# Patient Record
Sex: Female | Born: 1965 | Race: White | Hispanic: No | Marital: Single | Smoking: Former smoker
Health system: Southern US, Community
[De-identification: ages and names within clinical notes are randomized; demographics above are authoritative.]

## PROBLEM LIST (undated history)

## (undated) DIAGNOSIS — C801 Malignant (primary) neoplasm, unspecified: Secondary | ICD-10-CM

## (undated) DIAGNOSIS — M1990 Unspecified osteoarthritis, unspecified site: Secondary | ICD-10-CM

## (undated) DIAGNOSIS — I447 Left bundle-branch block, unspecified: Secondary | ICD-10-CM

## (undated) DIAGNOSIS — F419 Anxiety disorder, unspecified: Secondary | ICD-10-CM

## (undated) DIAGNOSIS — Z923 Personal history of irradiation: Secondary | ICD-10-CM

## (undated) DIAGNOSIS — K219 Gastro-esophageal reflux disease without esophagitis: Secondary | ICD-10-CM

## (undated) DIAGNOSIS — E559 Vitamin D deficiency, unspecified: Secondary | ICD-10-CM

## (undated) DIAGNOSIS — G4730 Sleep apnea, unspecified: Secondary | ICD-10-CM

## (undated) DIAGNOSIS — Z1379 Encounter for other screening for genetic and chromosomal anomalies: Secondary | ICD-10-CM

## (undated) DIAGNOSIS — E785 Hyperlipidemia, unspecified: Secondary | ICD-10-CM

## (undated) DIAGNOSIS — R519 Headache, unspecified: Secondary | ICD-10-CM

## (undated) DIAGNOSIS — R51 Headache: Secondary | ICD-10-CM

## (undated) DIAGNOSIS — F32A Depression, unspecified: Secondary | ICD-10-CM

## (undated) DIAGNOSIS — F329 Major depressive disorder, single episode, unspecified: Secondary | ICD-10-CM

## (undated) DIAGNOSIS — M1611 Unilateral primary osteoarthritis, right hip: Secondary | ICD-10-CM

## (undated) DIAGNOSIS — I10 Essential (primary) hypertension: Secondary | ICD-10-CM

## (undated) DIAGNOSIS — Z803 Family history of malignant neoplasm of breast: Secondary | ICD-10-CM

## (undated) HISTORY — DX: Sleep apnea, unspecified: G47.30

## (undated) HISTORY — DX: Essential (primary) hypertension: I10

## (undated) HISTORY — DX: Encounter for other screening for genetic and chromosomal anomalies: Z13.79

## (undated) HISTORY — PX: DENTAL SURGERY: SHX609

## (undated) HISTORY — DX: Anxiety disorder, unspecified: F41.9

## (undated) HISTORY — DX: Hyperlipidemia, unspecified: E78.5

## (undated) HISTORY — DX: Vitamin D deficiency, unspecified: E55.9

## (undated) HISTORY — DX: Family history of malignant neoplasm of breast: Z80.3

---

## 2000-02-11 ENCOUNTER — Other Ambulatory Visit: Admission: RE | Admit: 2000-02-11 | Discharge: 2000-02-11 | Payer: Self-pay | Admitting: Obstetrics and Gynecology

## 2000-02-26 ENCOUNTER — Other Ambulatory Visit: Admission: RE | Admit: 2000-02-26 | Discharge: 2000-02-26 | Payer: Self-pay | Admitting: Obstetrics and Gynecology

## 2000-02-26 ENCOUNTER — Encounter (INDEPENDENT_AMBULATORY_CARE_PROVIDER_SITE_OTHER): Payer: Self-pay | Admitting: Specialist

## 2000-10-07 ENCOUNTER — Ambulatory Visit (HOSPITAL_BASED_OUTPATIENT_CLINIC_OR_DEPARTMENT_OTHER): Admission: RE | Admit: 2000-10-07 | Discharge: 2000-10-07 | Payer: Self-pay | Admitting: *Deleted

## 2000-12-05 ENCOUNTER — Ambulatory Visit (HOSPITAL_BASED_OUTPATIENT_CLINIC_OR_DEPARTMENT_OTHER): Admission: RE | Admit: 2000-12-05 | Discharge: 2000-12-05 | Payer: Self-pay | Admitting: *Deleted

## 2001-01-07 ENCOUNTER — Encounter: Payer: Self-pay | Admitting: Obstetrics and Gynecology

## 2001-01-07 ENCOUNTER — Ambulatory Visit (HOSPITAL_COMMUNITY): Admission: RE | Admit: 2001-01-07 | Discharge: 2001-01-07 | Payer: Self-pay | Admitting: Obstetrics and Gynecology

## 2003-05-16 ENCOUNTER — Other Ambulatory Visit: Admission: RE | Admit: 2003-05-16 | Discharge: 2003-05-16 | Payer: Self-pay | Admitting: Obstetrics and Gynecology

## 2004-07-04 ENCOUNTER — Other Ambulatory Visit: Admission: RE | Admit: 2004-07-04 | Discharge: 2004-07-04 | Payer: Self-pay | Admitting: Obstetrics and Gynecology

## 2005-01-19 ENCOUNTER — Ambulatory Visit (HOSPITAL_COMMUNITY): Admission: RE | Admit: 2005-01-19 | Discharge: 2005-01-19 | Payer: Self-pay | Admitting: Obstetrics and Gynecology

## 2007-02-25 ENCOUNTER — Ambulatory Visit (HOSPITAL_COMMUNITY): Admission: RE | Admit: 2007-02-25 | Discharge: 2007-02-25 | Payer: Self-pay | Admitting: Family Medicine

## 2007-05-07 ENCOUNTER — Other Ambulatory Visit: Admission: RE | Admit: 2007-05-07 | Discharge: 2007-05-07 | Payer: Self-pay | Admitting: Obstetrics and Gynecology

## 2009-11-20 ENCOUNTER — Other Ambulatory Visit: Admission: RE | Admit: 2009-11-20 | Discharge: 2009-11-20 | Payer: Self-pay | Admitting: Obstetrics and Gynecology

## 2010-04-01 ENCOUNTER — Encounter: Payer: Self-pay | Admitting: Family Medicine

## 2010-06-22 ENCOUNTER — Encounter (HOSPITAL_COMMUNITY): Payer: Self-pay

## 2011-01-07 ENCOUNTER — Other Ambulatory Visit: Payer: Self-pay | Admitting: Obstetrics and Gynecology

## 2011-01-07 ENCOUNTER — Other Ambulatory Visit (HOSPITAL_COMMUNITY)
Admission: RE | Admit: 2011-01-07 | Discharge: 2011-01-07 | Disposition: A | Discharge status: Home / Self Care | Payer: PRIVATE HEALTH INSURANCE | Source: Ambulatory Visit | Attending: Obstetrics and Gynecology | Admitting: Obstetrics and Gynecology

## 2011-01-07 DIAGNOSIS — Z01419 Encounter for gynecological examination (general) (routine) without abnormal findings: Secondary | ICD-10-CM | POA: Insufficient documentation

## 2012-01-08 ENCOUNTER — Other Ambulatory Visit (HOSPITAL_COMMUNITY)
Admission: RE | Admit: 2012-01-08 | Discharge: 2012-01-08 | Disposition: A | Discharge status: Home / Self Care | Payer: BC Managed Care – PPO | Source: Ambulatory Visit | Attending: Obstetrics and Gynecology | Admitting: Obstetrics and Gynecology

## 2012-01-08 ENCOUNTER — Other Ambulatory Visit: Payer: Self-pay | Admitting: Obstetrics and Gynecology

## 2012-01-08 DIAGNOSIS — Z01419 Encounter for gynecological examination (general) (routine) without abnormal findings: Secondary | ICD-10-CM | POA: Insufficient documentation

## 2012-01-08 DIAGNOSIS — Z1151 Encounter for screening for human papillomavirus (HPV): Secondary | ICD-10-CM | POA: Insufficient documentation

## 2012-03-27 ENCOUNTER — Encounter: Payer: Self-pay | Admitting: Internal Medicine

## 2012-08-06 ENCOUNTER — Telehealth (INDEPENDENT_AMBULATORY_CARE_PROVIDER_SITE_OTHER): Payer: Self-pay | Admitting: General Surgery

## 2012-08-06 NOTE — Telephone Encounter (Signed)
Tina Ponce with [facility] calling to move patient's appt sooner. 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 She has had issues with hemorrhoids since December. States they are getting worse. She has an appt for evaluation next Friday. She was calling for her to be seen in urgent office. She read her last office notes and these are internal hemorrhoids and not thrombosed. I advised we could not see her in urgent office for this problem. I advised to keep appt and she can call for cancellations to be seen sooner.

## 2012-08-11 ENCOUNTER — Encounter (INDEPENDENT_AMBULATORY_CARE_PROVIDER_SITE_OTHER): Payer: Self-pay | Admitting: General Surgery

## 2012-08-14 ENCOUNTER — Encounter (INDEPENDENT_AMBULATORY_CARE_PROVIDER_SITE_OTHER): Payer: Self-pay | Admitting: General Surgery

## 2012-08-14 ENCOUNTER — Ambulatory Visit (INDEPENDENT_AMBULATORY_CARE_PROVIDER_SITE_OTHER): Payer: BC Managed Care – PPO | Admitting: General Surgery

## 2012-08-14 VITALS — BP 140/90 | HR 77 | Temp 98.3°F | Resp 17 | Ht 69.0 in | Wt 229.4 lb

## 2012-08-14 DIAGNOSIS — K644 Residual hemorrhoidal skin tags: Secondary | ICD-10-CM

## 2012-08-14 NOTE — Progress Notes (Signed)
Patient ID: Tina Ponce, female   DOB: May 05, 1965, 47 y.o.   MRN: [id number]  Chief Complaint  Patient presents with  . New Evaluation    eval hems    HPI Tina Ponce is a 47 y.o. female.  The patient is a 47 year old female who is referred by Dr. Tiburcio Pea secondary to hemorrhoids. Patient states she's had hemorrhoids for multiple years and has been increasing in pain over the last 6 months. The patient states she has had some stomach viruses which cause diarrhea over the last 6 months and feels that this is potentially treated to issue. The patient has been on Anusol as well as nitroglycerin cream which does help relieve some of the pain.  The patient is not on any fiber supplementation at this time. The patient states she has not noticed any bleeding and only complains of pain after having bowel movements. HPI  Past Medical History  Diagnosis Date  . Hypertension   . Anxiety   . Sleep apnea   . Unspecified vitamin D deficiency   . Hyperlipidemia   . Asthma     Past Surgical History  Procedure Laterality Date  . Cesarean section  11/03/90    Family History  Problem Relation Age of Onset  . Hypertension Mother   . Diabetes Mother   . Cancer Father   . Cancer Sister     breast    Social History History  Substance Use Topics  . Smoking status: Former Smoker    Quit date: 03/20/2010  . Smokeless tobacco: Not on file  . Alcohol Use: No    Allergies  Allergen Reactions  . Avelox (Moxifloxacin Hcl In Nacl) Swelling    erythema  . Codeine Itching    Current Outpatient Prescriptions  Medication Sig Dispense Refill  . albuterol (PROVENTIL HFA;VENTOLIN HFA) 108 (90 BASE) MCG/ACT inhaler Inhale 2 puffs into the lungs every 6 (six) hours as needed for wheezing.      Marland Kitchen ALPRAZolam (XANAX) 0.5 MG tablet Take 0.5 mg by mouth 3 (three) times daily as needed for sleep or anxiety.      . calcium carbonate 200 MG capsule Take 250 mg by mouth daily.      . carisoprodol  (SOMA) 350 MG tablet Take 350 mg by mouth 4 (four) times daily as needed for muscle spasms.      . hydrocortisone (ANUSOL-HC) 2.5 % rectal cream Place rectally 2 (two) times daily.      . hydrocortisone (ANUSOL-HC) 25 MG suppository Place 25 mg rectally 2 (two) times daily.      Marland Kitchen lidocaine (LMX) 4 % cream Apply 1 application topically as needed.      . meloxicam (MOBIC) 15 MG tablet Take 15 mg by mouth daily.      . sertraline (ZOLOFT) 50 MG tablet Take 50 mg by mouth once a week.       No current facility-administered medications for this visit.    Review of Systems Review of Systems  Constitutional: Negative.   HENT: Negative.   Eyes: Negative.   Respiratory: Negative.   Cardiovascular: Negative.   Gastrointestinal: Negative.   Neurological: Negative.   All other systems reviewed and are negative.    Blood pressure 140/90, pulse 77, temperature 98.3 F (36.8 C), temperature source Temporal, resp. rate 17, height 5\' 9"  (1.753 m), weight 229 lb 6.4 oz (104.055 kg), SpO2 98.00%.  Physical Exam Physical Exam  Vitals reviewed. Constitutional: She is oriented to person, place, and  time. She appears well-developed and well-nourished.  HENT:  Head: Normocephalic and atraumatic.  Eyes: Conjunctivae and EOM are normal. Pupils are equal, round, and reactive to light.  Neck: Normal range of motion. Neck supple.  Cardiovascular: Normal rate, regular rhythm and normal heart sounds.   Pulmonary/Chest: Effort normal and breath sounds normal.  Abdominal: Soft. Bowel sounds are normal. There is no tenderness. There is no rebound and no guarding.  Genitourinary:     Musculoskeletal: Normal range of motion.  Neurological: She is alert and oriented to person, place, and time.  Skin: Skin is warm and dry.    Data Reviewed none  Assessment    47 year old female with external hemorrhoids at the 3:00, 7:00, and 10:00 position. Patient also has internal hemorrhoids which noninflamed.       Plan    1. We discussed medical treatment as well as pathophysiology of hemorrhoids the patient would like to continue with medical treatment and begin fiber supplementation at this time. We also discussed sitz baths tub with pain after bowel movements. The patient states she does get some relief with nitroglycerin she can continue with this at this time. Would also recommend continue Anusol treatment at this time.  2. We discussed surgical treatment for her external hemorrhoids, at this time the patient like to avoid any surgery for hemorrhoids. 3. We have patient followup as needed.       Marigene Ehlers., Celeste Candelas 08/14/2012, 2:30 PM

## 2013-01-29 ENCOUNTER — Other Ambulatory Visit: Payer: Self-pay | Admitting: Obstetrics & Gynecology

## 2013-01-29 ENCOUNTER — Other Ambulatory Visit (HOSPITAL_COMMUNITY)
Admission: RE | Admit: 2013-01-29 | Discharge: 2013-01-29 | Disposition: A | Discharge status: Home / Self Care | Payer: BC Managed Care – PPO | Source: Ambulatory Visit | Attending: Obstetrics & Gynecology | Admitting: Obstetrics & Gynecology

## 2013-01-29 DIAGNOSIS — N760 Acute vaginitis: Secondary | ICD-10-CM | POA: Insufficient documentation

## 2013-01-29 DIAGNOSIS — Z1151 Encounter for screening for human papillomavirus (HPV): Secondary | ICD-10-CM | POA: Insufficient documentation

## 2013-01-29 DIAGNOSIS — Z124 Encounter for screening for malignant neoplasm of cervix: Secondary | ICD-10-CM | POA: Insufficient documentation

## 2013-01-29 DIAGNOSIS — R87810 Cervical high risk human papillomavirus (HPV) DNA test positive: Secondary | ICD-10-CM | POA: Insufficient documentation

## 2013-02-01 ENCOUNTER — Telehealth: Payer: Self-pay | Admitting: Genetic Counselor

## 2013-02-01 NOTE — Telephone Encounter (Signed)
Called pt to schedule genetic appt per pt will call back when ready to schedule due to insurance and coverage.

## 2013-02-08 ENCOUNTER — Ambulatory Visit: Payer: PRIVATE HEALTH INSURANCE | Admitting: Cardiology

## 2013-02-26 ENCOUNTER — Other Ambulatory Visit: Payer: Self-pay | Admitting: Obstetrics & Gynecology

## 2014-02-23 ENCOUNTER — Other Ambulatory Visit: Payer: Self-pay | Admitting: Obstetrics & Gynecology

## 2014-02-23 ENCOUNTER — Other Ambulatory Visit (HOSPITAL_COMMUNITY)
Admission: RE | Admit: 2014-02-23 | Discharge: 2014-02-23 | Disposition: A | Discharge status: Home / Self Care | Payer: BC Managed Care – PPO | Source: Ambulatory Visit | Attending: Obstetrics & Gynecology | Admitting: Obstetrics & Gynecology

## 2014-02-23 DIAGNOSIS — Z01419 Encounter for gynecological examination (general) (routine) without abnormal findings: Secondary | ICD-10-CM | POA: Insufficient documentation

## 2014-02-28 LAB — CYTOLOGY - PAP

## 2014-03-28 ENCOUNTER — Telehealth: Payer: Self-pay | Admitting: Neurology

## 2014-03-28 NOTE — Telephone Encounter (Signed)
Pt canceled her NP appt for tomorrow 03/29/14. She did not feel that she needed to be seen! Dr. Elta Guadeloupe Demonski/referral provider was notified

## 2014-03-29 ENCOUNTER — Ambulatory Visit: Payer: PRIVATE HEALTH INSURANCE | Admitting: Neurology

## 2014-04-28 ENCOUNTER — Ambulatory Visit: Payer: PRIVATE HEALTH INSURANCE | Admitting: Neurology

## 2015-12-08 ENCOUNTER — Ambulatory Visit (INDEPENDENT_AMBULATORY_CARE_PROVIDER_SITE_OTHER): Payer: PRIVATE HEALTH INSURANCE

## 2015-12-08 ENCOUNTER — Ambulatory Visit (INDEPENDENT_AMBULATORY_CARE_PROVIDER_SITE_OTHER): Payer: PRIVATE HEALTH INSURANCE | Admitting: Podiatry

## 2015-12-08 ENCOUNTER — Encounter: Payer: Self-pay | Admitting: Podiatry

## 2015-12-08 VITALS — BP 124/76 | HR 97 | Resp 16 | Ht 68.0 in | Wt 230.0 lb

## 2015-12-08 DIAGNOSIS — M79672 Pain in left foot: Secondary | ICD-10-CM | POA: Diagnosis not present

## 2015-12-08 DIAGNOSIS — M79671 Pain in right foot: Secondary | ICD-10-CM | POA: Diagnosis not present

## 2015-12-08 DIAGNOSIS — M722 Plantar fascial fibromatosis: Secondary | ICD-10-CM

## 2015-12-08 MED ORDER — TRIAMCINOLONE ACETONIDE 10 MG/ML IJ SUSP
10.0000 mg | Freq: Once | INTRAMUSCULAR | Status: AC
  Administered 2015-12-08: 10 mg

## 2015-12-08 NOTE — Patient Instructions (Signed)

## 2015-12-08 NOTE — Progress Notes (Signed)
   Subjective:    Patient ID: Tina Ponce, female    DOB: [birth year], 50 y.o.   MRN: [id number]  HPI  Chief Complaint  Patient presents with  . Foot Pain    Bilateral; heel; pt stated, "hurts more in the morning; pt has a history plantar fasciitis for the past 20 years"  . Toe Pain    Right foot; great toe-joint; pt stated, "Tripped on toe last month; hurts to walk on foot"     Review of Systems  All other systems reviewed and are negative.      Objective:   Physical Exam        Assessment & Plan:

## 2015-12-08 NOTE — Progress Notes (Signed)
Subjective:     Patient ID: Tina Ponce, female   DOB: 1966/01/29, 50 y.o.   MRN: [id number]  HPI patient has pain in both heels that she states has been very sore and making it hard to walk and wear shoe gear. States that this is been going on for a long time and that it's gradually getting worse   Review of Systems  All other systems reviewed and are negative.      Objective:   Physical Exam  Constitutional: She is oriented to person, place, and time.  Cardiovascular: Intact distal pulses.   Musculoskeletal: Normal range of motion.  Neurological: She is oriented to person, place, and time.  Skin: Skin is warm.  Nursing note and vitals reviewed.  neurovascular status intact muscle strength adequate range of motion within normal limits with patient found to have exquisite discomfort plantar heel region bilateral with inflammation fluid buildup noted. Patient's found to have moderate depression of the arch and is well oriented 3 with good digital perfusion     Assessment:     Plantar fasciitis bilateral with inflammation and fluid around the medial band    Plan:     H&P and condition x-rays reviewed with patient. Injected the plantar fascia bilateral 3 mg Kenalog 5 mill grams Xylocaine and applied fascial brace bilateral gave instructions on physical therapy and placed on oral anti-inflammatory. Reappoint for [place] to recheck again in the next several weeks  X-ray report indicates no indications of spur with mild depression of the arch and no other pathology

## 2016-02-08 ENCOUNTER — Other Ambulatory Visit: Payer: Self-pay | Admitting: Family Medicine

## 2016-02-08 DIAGNOSIS — M5416 Radiculopathy, lumbar region: Secondary | ICD-10-CM

## 2016-03-30 ENCOUNTER — Ambulatory Visit
Admission: RE | Admit: 2016-03-30 | Discharge: 2016-03-30 | Disposition: A | Discharge status: Home / Self Care | Payer: PRIVATE HEALTH INSURANCE | Source: Ambulatory Visit | Attending: Family Medicine | Admitting: Family Medicine

## 2016-03-30 DIAGNOSIS — M5416 Radiculopathy, lumbar region: Secondary | ICD-10-CM

## 2016-07-12 ENCOUNTER — Other Ambulatory Visit: Payer: Self-pay | Admitting: Internal Medicine

## 2016-07-12 DIAGNOSIS — M25552 Pain in left hip: Secondary | ICD-10-CM

## 2016-07-23 ENCOUNTER — Other Ambulatory Visit: Payer: PRIVATE HEALTH INSURANCE

## 2016-08-10 ENCOUNTER — Ambulatory Visit
Admission: RE | Admit: 2016-08-10 | Discharge: 2016-08-10 | Disposition: A | Discharge status: Home / Self Care | Payer: PRIVATE HEALTH INSURANCE | Source: Ambulatory Visit | Attending: Internal Medicine | Admitting: Internal Medicine

## 2016-08-10 DIAGNOSIS — M25552 Pain in left hip: Secondary | ICD-10-CM

## 2016-12-05 NOTE — Pre-Procedure Instructions (Signed)
VANICE RAPPA  12/05/2016      [facility] [place] ([place]), [place] [address] [place] [address] Phone: [phone] Fax: [phone]  [facility] [place], [place] - [place] [address] [place] [address] Phone: [phone] Fax: [phone]    Your procedure is scheduled on Tues. Oct. 9  Report to [facility] Admitting at 5:30 A.M.  Call this number if you have problems the morning of surgery:  [phone]   Remember:  Do not eat food or drink liquids after midnight on Mon. Oct 8   Take these medicines the morning of surgery with A SIP OF WATER : tylenol if needed, xanax if needed, cyclobenzaprine (flexeril), sertraline (zoloft), sumatriptan (imitrex), tramadol if needed                7 days prior to surgery STOP taking any Aspirin, Aleve, Naproxen, Ibuprofen, Motrin, Advil, Goody's, BC's, mobic (meloxicam), all herbal medications, fish oil, and all vitamins   Do not wear jewelry, make-up or nail polish.  Do not wear lotions, powders, or perfumes, or deoderant.  Do not shave 48 hours prior to surgery.  Men may shave face and neck.  Do not bring valuables to the hospital.  [facility] is not responsible for any belongings or valuables.  Contacts, dentures or bridgework may not be worn into surgery.  Leave your suitcase in the car.  After surgery it may be brought to your room.  For patients admitted to the hospital, discharge time will be determined by your treatment team.  Patients discharged the day of surgery will not be allowed to drive home.   Special instructions:  [facility]- Preparing For Surgery  Before surgery, you can play an important role. Because skin is not sterile, your skin needs to be as free of germs as possible. You can reduce the number of germs on your skin by washing with CHG (chlorahexidine gluconate) Soap before surgery.  CHG is an  antiseptic cleaner which kills germs and bonds with the skin to continue killing germs even after washing.  Please do not use if you have an allergy to CHG or antibacterial soaps. If your skin becomes reddened/irritated stop using the CHG.  Do not shave (including legs and underarms) for at least 48 hours prior to first CHG shower. It is OK to shave your face.  Please follow these instructions carefully.   1. Shower the NIGHT BEFORE SURGERY and the MORNING OF SURGERY with CHG.   2. If you chose to wash your hair, wash your hair first as usual with your normal shampoo.  3. After you shampoo, rinse your hair and body thoroughly to remove the shampoo.  4. Use CHG as you would any other liquid soap. You can apply CHG directly to the skin and wash gently with a scrungie or a clean washcloth.   5. Apply the CHG Soap to your body ONLY FROM THE NECK DOWN.  Do not use on open wounds or open sores. Avoid contact with your eyes, ears, mouth and genitals (private parts). Wash genitals (private parts) with your normal soap.  6. Wash thoroughly, paying special attention to the area where your surgery will be performed.  7. Thoroughly rinse your body with warm water from the neck down.  8. DO NOT shower/wash with your normal soap after using and rinsing off the CHG Soap.  9. [place]  yourself dry with a CLEAN TOWEL.   10. Wear CLEAN PAJAMAS   11. Place CLEAN SHEETS on your bed the night of your first shower and DO NOT SLEEP WITH PETS.    Day of Surgery: Do not apply any deodorants/lotions. Please wear clean clothes to the hospital/surgery center.      Please read over the following fact sheets that you were given. Coughing and Deep Breathing, Total Joint Packet, MRSA Information and Surgical Site Infection Prevention

## 2016-12-05 NOTE — Progress Notes (Signed)
No orders in epic, notified Dr. Luanna Cole office.

## 2016-12-06 ENCOUNTER — Other Ambulatory Visit: Payer: Self-pay | Admitting: Orthopedic Surgery

## 2016-12-06 ENCOUNTER — Inpatient Hospital Stay (HOSPITAL_COMMUNITY)
Admission: RE | Admit: 2016-12-06 | Discharge: 2016-12-06 | Disposition: A | Discharge status: Home / Self Care | Payer: PRIVATE HEALTH INSURANCE | Source: Ambulatory Visit

## 2016-12-10 ENCOUNTER — Encounter (HOSPITAL_COMMUNITY): Payer: Self-pay

## 2016-12-10 ENCOUNTER — Encounter (HOSPITAL_COMMUNITY)
Admission: RE | Admit: 2016-12-10 | Discharge: 2016-12-10 | Disposition: A | Discharge status: Home / Self Care | Payer: PRIVATE HEALTH INSURANCE | Source: Ambulatory Visit | Attending: Orthopedic Surgery | Admitting: Orthopedic Surgery

## 2016-12-10 DIAGNOSIS — Z01818 Encounter for other preprocedural examination: Secondary | ICD-10-CM | POA: Diagnosis present

## 2016-12-10 HISTORY — DX: Major depressive disorder, single episode, unspecified: F32.9

## 2016-12-10 HISTORY — DX: Left bundle-branch block, unspecified: I44.7

## 2016-12-10 HISTORY — DX: Gastro-esophageal reflux disease without esophagitis: K21.9

## 2016-12-10 HISTORY — DX: Unspecified osteoarthritis, unspecified site: M19.90

## 2016-12-10 HISTORY — DX: Depression, unspecified: F32.A

## 2016-12-10 LAB — CBC
HCT: 39.2 % (ref 36.0–46.0)
Hemoglobin: 12.7 g/dL (ref 12.0–15.0)
MCH: 27 pg (ref 26.0–34.0)
MCHC: 32.4 g/dL (ref 30.0–36.0)
MCV: 83.4 fL (ref 78.0–100.0)
PLATELETS: 206 10*3/uL (ref 150–400)
RBC: 4.7 MIL/uL (ref 3.87–5.11)
RDW: 15 % (ref 11.5–15.5)
WBC: 9.6 10*3/uL (ref 4.0–10.5)

## 2016-12-10 LAB — BASIC METABOLIC PANEL
Anion gap: 10 (ref 5–15)
BUN: 16 mg/dL (ref 6–20)
CALCIUM: 9.3 mg/dL (ref 8.9–10.3)
CO2: 23 mmol/L (ref 22–32)
CREATININE: 0.86 mg/dL (ref 0.44–1.00)
Chloride: 103 mmol/L (ref 101–111)
GFR calc non Af Amer: >60 mL/min (ref >60)
GLUCOSE: 105 mg/dL — AB (ref 65–99)
Potassium: 3.9 mmol/L (ref 3.5–5.1)
Sodium: 136 mmol/L (ref 135–145)

## 2016-12-10 LAB — SURGICAL PCR SCREEN
MRSA, PCR: NEGATIVE
Staphylococcus aureus: NEGATIVE

## 2016-12-10 NOTE — Progress Notes (Addendum)
PCP is Dr. Shirline Frees Cardiologist is  Dr.Spencer Wynonia Lawman states she sees him for bundle branch block  Denies chest pain, Fever, or cough. Instructed to bring mask on the day of surgery.   Denies ever having a card cath or echo.  reports she had a stress test about 2 years ago.  Request sent for stress test and EKG with Dr Wynonia Lawman

## 2016-12-12 ENCOUNTER — Encounter (HOSPITAL_COMMUNITY): Payer: Self-pay | Admitting: Vascular Surgery

## 2016-12-12 ENCOUNTER — Encounter (HOSPITAL_COMMUNITY): Payer: Self-pay

## 2016-12-12 NOTE — Progress Notes (Addendum)
Anesthesia Chart Review: Patient is a 51 year old female scheduled for right THA on 12/27/16 by Dr. Marchia Bond.  History includes former smoker (quit '12), HTN, HLD, left BBB, GERD, anxiety, depression, asthma, arthritis, OSA (CPAP), dental surgery. BMI is consistent with obesity (borderline morbid obesity).  - PCP is Dr. Ernestine Conrad. - Cardiologist is Dr. Viona Gilmore. Tollie Eth. She was seen on 10/25/16 for a preoperative evaluation. He has seen her two years prior for preoperative evaluation in the setting of left BBB. By notes, she had an echo, stress test, and Holter monitor. Stress test was negative, EF 53%. On 10/25/16 Dr. Wynonia Lawman wrote, "From a cardiac viewpoint may proceed with the planned hip replacement surgery. No additional cardiovascular workup is necessary..."   Meds include Xanax, Flexeril, Zantac, Zoloft, Imitrex, tramadol.  BP 130/68   Pulse 98   Temp 36.9 C   Resp 20   Ht 5\' 8"  (1.727 m)   Wt 261 lb 1.6 oz (118.4 kg)   SpO2 98%   BMI 39.70 kg/m   EKG 10/25/16 (Dr. Wynonia Lawman): SR, left BBB.  Nuclear stress test 05/12/14 (Dr. Wynonia Lawman): Impression: 1. Normal Lexiscan Myoview scan with no evidence of ischemia or infarction. 2. Normal quantitative gated SPECT EF of 53% with normal wall motion and wall thickening. Recommendations: No evidence of myocardial ischemia noted. Okay to proceed with bariatric surgery from a cardiac viewpoint. [Patient never had bariatric surgery.]  Echo 05/02/14 (Dr. Wynonia Lawman): Conclusions: - Mild concentric LVH with lower limits of normal LV systolic function. Abnormal septal motion c/w LBBB. Estimated EF 50%. - Moderate left atrial enlargement. - Mild mitral regurgitation. - Trace tricuspid and pulmonic regurgitation.  Preoperative labs noted. Cr 0.86. Glucose 105. CBC WNL.   If no acute changes then I anticipate that she can proceed as planned.  [facility] Short Stay Center/Anesthesiology Phone [phone] 12/12/2016 3:28  PM

## 2016-12-16 ENCOUNTER — Other Ambulatory Visit: Payer: Self-pay | Admitting: Radiology

## 2016-12-17 ENCOUNTER — Encounter (HOSPITAL_COMMUNITY): Admission: RE | Payer: Self-pay | Source: Ambulatory Visit

## 2016-12-17 ENCOUNTER — Inpatient Hospital Stay (HOSPITAL_COMMUNITY)
Admission: RE | Admit: 2016-12-17 | Payer: PRIVATE HEALTH INSURANCE | Source: Ambulatory Visit | Admitting: Orthopedic Surgery

## 2016-12-17 SURGERY — ARTHROPLASTY, HIP, TOTAL,POSTERIOR APPROACH
Anesthesia: Choice | Laterality: Right

## 2016-12-19 ENCOUNTER — Encounter: Payer: Self-pay | Admitting: *Deleted

## 2016-12-19 ENCOUNTER — Encounter: Payer: Self-pay | Admitting: Hematology and Oncology

## 2016-12-19 DIAGNOSIS — C50211 Malignant neoplasm of upper-inner quadrant of right female breast: Secondary | ICD-10-CM

## 2016-12-19 DIAGNOSIS — Z170 Estrogen receptor positive status [ER+]: Principal | ICD-10-CM

## 2016-12-25 ENCOUNTER — Ambulatory Visit: Payer: PRIVATE HEALTH INSURANCE | Attending: General Surgery | Admitting: Physical Therapy

## 2016-12-25 ENCOUNTER — Other Ambulatory Visit (HOSPITAL_BASED_OUTPATIENT_CLINIC_OR_DEPARTMENT_OTHER): Payer: PRIVATE HEALTH INSURANCE

## 2016-12-25 ENCOUNTER — Ambulatory Visit: Payer: Self-pay | Admitting: General Surgery

## 2016-12-25 ENCOUNTER — Ambulatory Visit (HOSPITAL_BASED_OUTPATIENT_CLINIC_OR_DEPARTMENT_OTHER): Payer: PRIVATE HEALTH INSURANCE | Admitting: Hematology and Oncology

## 2016-12-25 ENCOUNTER — Ambulatory Visit
Admission: RE | Admit: 2016-12-25 | Discharge: 2016-12-25 | Disposition: A | Discharge status: Home / Self Care | Payer: PRIVATE HEALTH INSURANCE | Source: Ambulatory Visit | Attending: Radiation Oncology | Admitting: Radiation Oncology

## 2016-12-25 ENCOUNTER — Encounter: Payer: Self-pay | Admitting: Hematology and Oncology

## 2016-12-25 DIAGNOSIS — R293 Abnormal posture: Secondary | ICD-10-CM | POA: Diagnosis present

## 2016-12-25 DIAGNOSIS — Z79811 Long term (current) use of aromatase inhibitors: Secondary | ICD-10-CM | POA: Insufficient documentation

## 2016-12-25 DIAGNOSIS — R262 Difficulty in walking, not elsewhere classified: Secondary | ICD-10-CM

## 2016-12-25 DIAGNOSIS — C50211 Malignant neoplasm of upper-inner quadrant of right female breast: Secondary | ICD-10-CM

## 2016-12-25 DIAGNOSIS — Z170 Estrogen receptor positive status [ER+]: Secondary | ICD-10-CM | POA: Diagnosis not present

## 2016-12-25 DIAGNOSIS — Z510 Encounter for antineoplastic radiation therapy: Secondary | ICD-10-CM | POA: Insufficient documentation

## 2016-12-25 DIAGNOSIS — Z91040 Latex allergy status: Secondary | ICD-10-CM | POA: Insufficient documentation

## 2016-12-25 DIAGNOSIS — I10 Essential (primary) hypertension: Secondary | ICD-10-CM | POA: Diagnosis not present

## 2016-12-25 DIAGNOSIS — Z881 Allergy status to other antibiotic agents status: Secondary | ICD-10-CM | POA: Insufficient documentation

## 2016-12-25 DIAGNOSIS — Z885 Allergy status to narcotic agent status: Secondary | ICD-10-CM | POA: Insufficient documentation

## 2016-12-25 DIAGNOSIS — Z79899 Other long term (current) drug therapy: Secondary | ICD-10-CM | POA: Insufficient documentation

## 2016-12-25 LAB — COMPREHENSIVE METABOLIC PANEL
ALT: 20 U/L (ref 0–55)
ANION GAP: 8 meq/L (ref 3–11)
AST: 22 U/L (ref 5–34)
Albumin: 3.8 g/dL (ref 3.5–5.0)
Alkaline Phosphatase: 68 U/L (ref 40–150)
BUN: 11.9 mg/dL (ref 7.0–26.0)
CALCIUM: 9.5 mg/dL (ref 8.4–10.4)
CHLORIDE: 105 meq/L (ref 98–109)
CO2: 27 meq/L (ref 22–29)
Creatinine: 0.8 mg/dL (ref 0.6–1.1)
EGFR: >60 mL/min/{1.73_m2} (ref >60)
Glucose: 98 mg/dl (ref 70–140)
POTASSIUM: 4.4 meq/L (ref 3.5–5.1)
Sodium: 141 mEq/L (ref 136–145)
Total Bilirubin: 0.36 mg/dL (ref 0.20–1.20)
Total Protein: 7.3 g/dL (ref 6.4–8.3)

## 2016-12-25 LAB — CBC WITH DIFFERENTIAL/PLATELET
BASO%: 0.6 % (ref 0.0–2.0)
BASOS ABS: 0 10*3/uL (ref 0.0–0.1)
EOS%: 2.7 % (ref 0.0–7.0)
Eosinophils Absolute: 0.2 10*3/uL (ref 0.0–0.5)
HEMATOCRIT: 39.5 % (ref 34.8–46.6)
HGB: 13 g/dL (ref 11.6–15.9)
LYMPH#: 1.9 10*3/uL (ref 0.9–3.3)
LYMPH%: 29.4 % (ref 14.0–49.7)
MCH: 27.5 pg (ref 25.1–34.0)
MCHC: 33.1 g/dL (ref 31.5–36.0)
MCV: 83.1 fL (ref 79.5–101.0)
MONO#: 0.3 10*3/uL (ref 0.1–0.9)
MONO%: 5 % (ref 0.0–14.0)
NEUT#: 4.1 10*3/uL (ref 1.5–6.5)
NEUT%: 62.3 % (ref 38.4–76.8)
PLATELETS: 202 10*3/uL (ref 145–400)
RBC: 4.75 10*6/uL (ref 3.70–5.45)
RDW: 15.6 % — ABNORMAL HIGH (ref 11.2–14.5)
WBC: 6.6 10*3/uL (ref 3.9–10.3)
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
## 2016-12-25 NOTE — Assessment & Plan Note (Signed)
12/16/2016:Screening detected right breast mass 1.2 cm by ultrasound, axilla negative: Biopsy IDC grade 1 with DCIS ER 95% PR 95%, KI 40%,HER-2 negative ratio 1.33, T1 BN 0 stage IA clinical stage AJCC 8  Pathology and radiology counseling:Discussed with the patient, the details of pathology including the type of breast cancer,the clinical staging, the significance of ER, PR and HER-2/neu receptors and the implications for treatment. After reviewing the pathology in detail, we proceeded to discuss the different treatment options between surgery, radiation, chemotherapy, antiestrogen therapies.  Recommendations: 1. Breast conserving surgery followed by 2. Oncotype DX testing to determine if chemotherapy would be of any benefit followed by 3. Adjuvant radiation therapy followed by 4. Adjuvant antiestrogen therapy  Oncotype counseling: I discussed Oncotype DX test. I explained to the patient that this is a 21 gene panel to evaluate patient tumors DNA to calculate recurrence score. This would help determine whether patient has high risk or intermediate risk or low risk breast cancer. She understands that if her tumor was found to be high risk, she would benefit from systemic chemotherapy. If low risk, no need of chemotherapy. If she was found to be intermediate risk, we would need to evaluate the score as well as other risk factors and determine if an abbreviated chemotherapy may be of benefit.  Return to clinic after surgery to discuss final pathology report and then determine if Oncotype DX testing will need to be sent.

## 2016-12-25 NOTE — Patient Instructions (Signed)

## 2016-12-25 NOTE — Therapy (Signed)
[place], [place], [address] Phone: [phone]   Fax:  [phone]  Physical Therapy Evaluation  Patient Details  Name: Tina Ponce MRN: [id number] Date of Birth: 04/24/1965 Referring Provider: Dr. Autumn Messing  Encounter Date: 12/25/2016      PT End of Session - 12/25/16 1628    Visit Number 1   Number of Visits 2   Date for PT Re-Evaluation 02/24/17   PT Start Time 3716   PT Stop Time 1437   PT Time Calculation (min) 23 min   Activity Tolerance Patient tolerated treatment well   Behavior During Therapy [facility] for tasks assessed/performed      Past Medical History:  Diagnosis Date  . Anxiety   . Arthritis   . Asthma   . Depression   . GERD (gastroesophageal reflux disease)   . Hyperlipidemia   . Hypertension   . Left bundle branch block   . Sleep apnea   . Unspecified vitamin D deficiency     Past Surgical History:  Procedure Laterality Date  . CESAREAN SECTION  11/03/90  . DENTAL SURGERY     fillings and crown    There were no vitals filed for this visit.       Subjective Assessment - 12/25/16 1618    Subjective Patient reports she is here to be seen by her medical team for her newly diagnosed right breast cancer.   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 It measures 1.2 cm and is located in the upper inner quadrant. It is ER/PR positive and HEr2 negative with a Ki67 of 10%. She also has severe bilateral hip osteoarthritis requiring bilateral total hip replacements. She was scheduled for that surgery but that has been placed on hold until after her treatment for breast cancer.   Patient Stated Goals reduce lymphedema risk and learn post op shoulder ROM HEP   Currently in Pain? Yes   Pain Score 7    Pain Location Hip   Pain Orientation Right;Left   Pain  Descriptors / Indicators Aching   Pain Type Chronic pain   Pain Onset More than a month ago   Pain Frequency Constant   Aggravating Factors  Walking, sitting   Pain Relieving Factors Unknown   Effect of Pain on Daily Activities Limits function; unable to walk, sit or stand long periods            [facility] PT Assessment - 12/25/16 0001      Assessment   Medical Diagnosis Right breast cancer   Referring Provider Dr. Autumn Messing   Onset Date/Surgical Date 12/11/16   Hand Dominance Right   Prior Therapy none     Precautions   Precautions Other (comment)   Precaution Comments active cancer; severe hip pain     Restrictions   Weight Bearing Restrictions No     Balance Screen   Has the patient fallen in the past 6 months No   Has the patient had a decrease in activity level because of a fear of falling?  No   Is the patient reluctant to leave their home because of a fear of falling?  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   Leisure She does not exercise due to hip pain     Cognition   Overall Cognitive Status Within Functional Limits for tasks assessed     Posture/Postural Control   Posture/Postural Control Postural limitations   Postural Limitations Rounded Shoulders;Forward head     ROM / Strength   AROM / PROM / Strength AROM;Strength     AROM   AROM Assessment Site Shoulder;Cervical   Right/Left Shoulder Right;Left   Right Shoulder Extension 42 Degrees   Right Shoulder Flexion 141 Degrees   Right Shoulder ABduction 150 Degrees   Right Shoulder Internal Rotation 65 Degrees   Right Shoulder External Rotation 88  Degrees   Left Shoulder Extension 37 Degrees   Left Shoulder Flexion 135 Degrees   Left Shoulder ABduction 171 Degrees   Left Shoulder Internal Rotation 52 Degrees   Left Shoulder External Rotation 70 Degrees   Cervical Flexion WNL   Cervical Extension 25% limited   Cervical - Right Side Bend 25% limited   Cervical - Left Side Bend 25% limited   Cervical - Right Rotation WNL   Cervical - Left Rotation WNL     Strength   Overall Strength Within functional limits for tasks performed   Overall Strength Comments Upper extremities are [facility]           LYMPHEDEMA/ONCOLOGY QUESTIONNAIRE - 12/25/16 1626      Type   Cancer Type Right breast cancer     Lymphedema Assessments   Lymphedema Assessments Upper extremities     Right Upper Extremity Lymphedema   10 cm Proximal to Olecranon Process 39.7 cm   Olecranon Process 29.5 cm   10 cm Proximal to Ulnar Styloid Process 23.9 cm   Just Proximal to Ulnar Styloid Process 17.5 cm   Across Hand at [employer] 20.7 cm   At [place] of 2nd Digit 7.2 cm     Left Upper Extremity Lymphedema   10 cm Proximal to Olecranon Process 38.5 cm   Olecranon Process 28.8 cm   10 cm Proximal to Ulnar Styloid Process 23.5 cm   Just Proximal to Ulnar Styloid Process 17.7 cm   Across Hand at [employer] 20.5 cm   At [place] of 2nd Digit 7.1 cm         Objective measurements completed on examination: See above findings.     Patient was instructed today in a home exercise program today for post op shoulder range of motion. These included active assist shoulder flexion in sitting, scapular retraction, wall walking with shoulder abduction, and hands behind head external rotation.  She was encouraged to do these twice a day, holding 3 seconds and repeating 5 times when permitted by her physician.         PT Education - 12/25/16 1627    Education provided Yes   Education Details Lymphedema risk reduction and post op shoulder ROM HEP   Person(s)  Educated Patient;Other (comment)  Sister   Methods Explanation;Demonstration;Handout   Comprehension Returned demonstration;Verbalized understanding              Breast Clinic Goals - 12/25/16 1632      Patient will be able to verbalize understanding of pertinent lymphedema risk reduction practices relevant to her diagnosis specifically related to  skin care.   Time 1   Period Days   Status Achieved     Patient will be able to return demonstrate and/or verbalize understanding of the post-op home exercise program related to regaining shoulder range of motion.   Time 1   Period Days   Status Achieved     Patient will be able to verbalize understanding of the importance of attending the postoperative After Breast Cancer Class for further lymphedema risk reduction education and therapeutic exercise.   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 It measures 1.2 cm and is located in the upper inner quadrant. It is ER/PR positive and HEr2 negative with a Ki67 of 10%. She also has severe bilateral hip osteoarthritis requiring bilateral total hip replacements. She was scheduled for that surgery but that has been placed on hold until after her treatment for breast cancer. Her multidisciplinary medical team met prior to her assessments to determine a recommended treatment plan. She is planning to have a right lumpectomy and sentinel node biopsy followed by Oncotype testing, radiation, and anti-estrogen therapy. She will benefit from a post op PT visit to reassess and determine PT needs.   History and Personal Factors relevant to plan of care: Needs bilateral THR and is significantly functionally limited by hip pain; currently unable to work due to hip pain   Clinical Presentation Stable   Clinical Decision Making Low   Rehab  Potential Excellent   Clinical Impairments Affecting Rehab Potential Possibly hip pain   PT Frequency --  2 visits; eval and 1 f/u visit   PT Treatment/Interventions ADLs/Self Care Home Management;Therapeutic exercise;Patient/family education   PT Next Visit Plan Will f/u 3-4 weeks post op to determine PT needs   PT Home Exercise Plan Post op shoulder ROM HEP   Consulted and Agree with Plan of Care Patient;Family member/caregiver   Family Member Consulted sister      Patient will benefit from skilled therapeutic intervention in order to improve the following deficits and impairments:  Decreased range of motion, Impaired UE functional use, Difficulty walking, Pain, Decreased knowledge of precautions, Postural dysfunction  Visit Diagnosis: Malignant neoplasm of upper-inner quadrant of right breast in female, estrogen receptor positive (HCC) - Plan: PT plan of care cert/re-cert  Abnormal posture - Plan: PT plan of care cert/re-cert  Difficulty in walking, not elsewhere classified - Plan: PT plan of care cert/re-cert   Patient will follow up at outpatient cancer rehab 3-4 weeks following surgery.  If the patient requires physical therapy at that time, a specific plan will be dictated and sent to the referring physician for approval. The patient was educated today on appropriate basic range of motion exercises to begin post operatively and the importance of attending the After Breast Cancer class following surgery.  Patient was educated today on lymphedema risk reduction practices as it pertains to recommendations that will benefit the patient immediately following surgery.  She verbalized good understanding.     Problem List Patient Active Problem List   Diagnosis Date Noted  . Malignant neoplasm of upper-inner quadrant of right breast in female, estrogen receptor positive (HCC) 12/19/2016   Bethann Punches, PT 12/25/16 4:34 PM  [facility]  [address] [place], [place],  [address] Phone: [phone]   Fax:  [phone]  Name: ALLE DIFABIO MRN: [id number] Date of Birth: 13-Jun-1965

## 2016-12-25 NOTE — Progress Notes (Signed)
[facility] CONSULT NOTE  Patient Care Team: Shirline Frees, MD as PCP - General (Family Medicine)  CHIEF COMPLAINTS/PURPOSE OF CONSULTATION:  Newly diagnosed breast cancer  HISTORY OF PRESENTING ILLNESS:  Tina Ponce 51 y.o. female is here because of recent diagnosis of right breast cancer. Patient had a screening mammogram the detected a right breast mass measuring 1.2 cm by ultrasound. Axilla was negative. Biopsy was last revealed a grade 1 invasive ductal carcinoma with DCIS that is ER/PR positive HER-2 negative with a Ki-67 40%. She was presented this morning in the multidisciplinary tumor board and she is here today accompanied by her sister to discuss the treatment plan. Patient is under extraordinary amount of emotional stress. Patrice recently fell on her house and she is living in a hotel room. Her son is in [place] trying to do some cleanup work and apparently has profound depression and has talked about suicide before. She was ready to undergo hip replacement surgery when the breast cancer diagnosed and the hip replacement surgery got postponed. Because of this osteoarthritis she is unable to function normally.  I reviewed her records extensively and collaborated the history with the patient.  SUMMARY OF ONCOLOGIC HISTORY:   Malignant neoplasm of upper-inner quadrant of right breast in female, estrogen receptor positive ([place])   12/16/2016 Initial Diagnosis    Screening detected right breast mass 1.2 cm by ultrasound, axilla negative: Biopsy IDC grade 1 with DCIS ER 95% PR 95%, KI 40%,HER-2 negative ratio 1.33, T1 BN 0 stage IA clinical stage AJCC 8      MEDICAL HISTORY:  Past Medical History:  Diagnosis Date  . Anxiety   . Arthritis   . Asthma   . Depression   . GERD (gastroesophageal reflux disease)   . Hyperlipidemia   . Hypertension   . Left bundle branch block   . Sleep apnea   . Unspecified vitamin D deficiency     SURGICAL HISTORY: Past  Surgical History:  Procedure Laterality Date  . CESAREAN SECTION  11/03/90  . DENTAL SURGERY     fillings and crown    SOCIAL HISTORY: Social History   Social History  . Marital status: Single    Spouse name: N/A  . Number of children: N/A  . Years of education: N/A   Occupational History  . Not on file.   Social History Main Topics  . Smoking status: Former Smoker    Quit date: 03/20/2005  . Smokeless tobacco: Never Used  . Alcohol use No  . Drug use: No  . Sexual activity: Not on file   Other Topics Concern  . Not on file   Social History Narrative  . No narrative on file    FAMILY HISTORY: Family History  Problem Relation Age of Onset  . Hypertension Mother   . Diabetes Mother   . Cancer Father        lung  . Cancer Sister        breast  . Colon cancer Maternal Grandfather     ALLERGIES:  is allergic to avelox [moxifloxacin hcl in nacl]; codeine; and latex.  MEDICATIONS:  Current Outpatient Prescriptions  Medication Sig Dispense Refill  . ALPRAZolam (XANAX) 0.5 MG tablet Take 0.5 mg by mouth 3 (three) times daily as needed for sleep or anxiety.    . cyclobenzaprine (FLEXERIL) 10 MG tablet Take 10 mg by mouth 3 (three) times daily as needed for muscle spasms.    . meloxicam (MOBIC) 15 MG  tablet Take 15 mg by mouth daily.    . ranitidine (ZANTAC) 150 MG tablet Take 150 mg by mouth every evening.    . sertraline (ZOLOFT) 100 MG tablet Take 100 mg by mouth daily.    . SUMAtriptan (IMITREX) 100 MG tablet Take 100 mg by mouth every 2 (two) hours as needed for migraine. May repeat in 2 hours if headache persists or recurs.     No current facility-administered medications for this visit.     REVIEW OF SYSTEMS:   Constitutional: Denies fevers, chills or abnormal night sweats Eyes: Denies blurriness of vision, double vision or watery eyes Ears, nose, mouth, throat, and face: Denies mucositis or sore throat Respiratory: Denies cough, dyspnea or  wheezes Cardiovascular: Denies palpitation, chest discomfort or lower extremity swelling Gastrointestinal:  Denies nausea, heartburn or change in bowel habits Skin: Denies abnormal skin rashes Lymphatics: Denies new lymphadenopathy or easy bruising Neurological:Denies numbness, tingling or new weaknesses Behavioral/Psych: major depression  Breast:  Denies any palpable lumps or discharge All other systems were reviewed with the patient and are negative.  PHYSICAL EXAMINATION: ECOG PERFORMANCE STATUS: 1 - Symptomatic but completely ambulatory  Vitals:   12/25/16 1306  BP: 119/69  Pulse: 80  Resp: 18  Temp: 98.4 F (36.9 C)  SpO2: 97%   Filed Weights   12/25/16 1306  Weight: 258 lb 6.2 oz (117.2 kg)    GENERAL:alert, no distress and comfortable SKIN: skin color, texture, turgor are normal, no rashes or significant lesions EYES: normal, conjunctiva are pink and non-injected, sclera clear OROPHARYNX:no exudate, no erythema and lips, buccal mucosa, and tongue normal  NECK: supple, thyroid normal size, non-tender, without nodularity LYMPH:  no palpable lymphadenopathy in the cervical, axillary or inguinal LUNGS: clear to auscultation and percussion with normal breathing effort HEART: regular rate & rhythm and no murmurs and no lower extremity edema ABDOMEN:abdomen soft, non-tender and normal bowel sounds Musculoskeletal:no cyanosis of digits and no clubbing  PSYCH:depression NEURO: no focal motor/sensory deficits  LABORATORY DATA:  I have reviewed the data as listed Lab Results  Component Value Date   WBC 6.6 12/25/2016   HGB 13.0 12/25/2016   HCT 39.5 12/25/2016   MCV 83.1 12/25/2016   PLT 202 12/25/2016   Lab Results  Component Value Date   NA 141 12/25/2016   K 4.4 12/25/2016   CL 103 12/10/2016   CO2 27 12/25/2016    RADIOGRAPHIC STUDIES: I have personally reviewed the radiological reports and agreed with the findings in the report.  ASSESSMENT AND PLAN:   Malignant neoplasm of upper-inner quadrant of right breast in female, estrogen receptor positive ([place]) 12/16/2016:Screening detected right breast mass 1.2 cm by ultrasound, axilla negative: Biopsy IDC grade 1 with DCIS ER 95% PR 95%, KI 40%,HER-2 negative ratio 1.33, T1 BN 0 stage IA clinical stage AJCC 8  Pathology and radiology counseling:Discussed with the patient, the details of pathology including the type of breast cancer,the clinical staging, the significance of ER, PR and HER-2/neu receptors and the implications for treatment. After reviewing the pathology in detail, we proceeded to discuss the different treatment options between surgery, radiation, chemotherapy, antiestrogen therapies.  Recommendations: 1. Breast conserving surgery followed by 2. Oncotype DX testing ( patient informed me that she will not receive chemotherapy). Because this I probably will not be running Oncotype DX testing. 3. Adjuvant radiation therapy followed by 4. Adjuvant antiestrogen therapy  Oncotype counseling: I discussed Oncotype DX test. I explained to the patient that this is a  21 gene panel to evaluate patient tumors DNA to calculate recurrence score. This would help determine whether patient has high risk or intermediate risk or low risk breast cancer. She understands that if her tumor was found to be high risk, she would benefit from systemic chemotherapy. If low risk, no need of chemotherapy. If she was found to be intermediate risk, we would need to evaluate the score as well as other risk factors and determine if an abbreviated chemotherapy may be of benefit.  Return to clinic after surgery to discuss final pathology report and then determine if Oncotype DX testing will need to be sent. Patient told me that she will not receive chemotherapy no matter what. Because this may proceed to radiation followed by antiestrogen therapy after surgery.  All questions were answered. The patient knows to call the  clinic with any problems, questions or concerns.    Rulon Eisenmenger, MD 12/25/16

## 2016-12-26 ENCOUNTER — Encounter: Payer: Self-pay | Admitting: Genetic Counselor

## 2016-12-26 ENCOUNTER — Ambulatory Visit (HOSPITAL_BASED_OUTPATIENT_CLINIC_OR_DEPARTMENT_OTHER): Payer: PRIVATE HEALTH INSURANCE | Admitting: Genetic Counselor

## 2016-12-26 DIAGNOSIS — Z1379 Encounter for other screening for genetic and chromosomal anomalies: Secondary | ICD-10-CM

## 2016-12-26 DIAGNOSIS — Z803 Family history of malignant neoplasm of breast: Secondary | ICD-10-CM | POA: Diagnosis not present

## 2016-12-26 DIAGNOSIS — C50211 Malignant neoplasm of upper-inner quadrant of right female breast: Secondary | ICD-10-CM

## 2016-12-26 DIAGNOSIS — Z7183 Encounter for nonprocreative genetic counseling: Secondary | ICD-10-CM

## 2016-12-26 HISTORY — DX: Encounter for other screening for genetic and chromosomal anomalies: Z13.79

## 2016-12-26 NOTE — Progress Notes (Signed)
[facility]      Initial Visit   Patient Name: Tina Ponce Patient DOB: 1965-08-17 Patient Age: 51 y.o. Encounter Date: 12/26/2016  Referring Provider: Nicholas Lose, MD  Primary Care Provider: Shirline Frees, MD  Reason for Visit: Evaluate for hereditary susceptibility to cancer    Assessment and Plan:  . Tina Ponce personal and family histories are suggestive of a hereditary predisposition to cancer. A causative mutation was not found in her sisters with breast cancer and hence, it is likely that Tina Ponce results will also be normal.   . Testing is recommended to determine whether she has a pathogenic mutation that will impact her screening and risk-reduction for cancer. A negative result will be reassuring.  . Tina Ponce wished to pursue genetic testing and a blood sample will be sent to [facility] for analysis. [facility]'s STAT breast panel was requested as it will impact surgical decisions. Results should be available in about 7-12 days. The 9 genes on this panel are ATM, BRCA1, BRCA2, CDH1, CHEK2, PALB2, PTEN, STK11, TP53. Once this test is complete, analysis of additional genes on a larger hereditary cancer panel will proceed. She will be called after each result is obtained.   Dr. Lindi Adie was available for questions concerning this case. Total time spent by me in face-to-face counseling was approximately 30 minutes.   _____________________________________________________________________   History of Present Illness: Tina Ponce, a 51 y.o. female, is being seen at the [facility] due to a personal and family history of cancer. She presents to clinic today to discuss the possibility of a hereditary predisposition to cancer and discuss whether genetic testing is warranted.  Tina Ponce was recently diagnosed with breast cancer at the age of 89. She indicated that she will be using results of genetic testing to  decide which surgery to have, but that she is also needing to schedule bilateral hip replacement surgery and that her breast surgery may be delayed.       Malignant neoplasm of upper-inner quadrant of right breast in female, estrogen receptor positive ([place] [facility])   12/16/2016 Initial Diagnosis    Screening detected right breast mass 1.2 cm by ultrasound, axilla negative: Biopsy IDC grade 1 with DCIS ER 95% PR 95%, KI 40%,HER-2 negative ratio 1.33, T1 BN 0 stage IA clinical stage AJCC 8        Past Medical History:  Diagnosis Date  . Anxiety   . Arthritis   . Asthma   . Depression   . Family history of breast cancer   . GERD (gastroesophageal reflux disease)   . Hyperlipidemia   . Hypertension   . Left bundle branch block   . Sleep apnea   . Unspecified vitamin D deficiency     Past Surgical History:  Procedure Laterality Date  . CESAREAN SECTION  11/03/90  . DENTAL SURGERY     fillings and crown    Social History   Social History  . Marital status: Single    Spouse name: N/A  . Number of children: N/A  . Years of education: N/A   Social History Main Topics  . Smoking status: Former Smoker    Quit date: 03/20/2005  . Smokeless tobacco: Never Used  . Alcohol use No  . Drug use: No  . Sexual activity: Not on file   Other Topics Concern  . Not on file   Social History Narrative  . No narrative  on file     Family History:  During the visit, a 4-generation pedigree was obtained. Family tree will be scanned in the Media tab in Epic  Significant diagnoses include the following:  Family History  Problem Relation Age of Onset  . Hypertension Mother   . Diabetes Mother   . Cancer Father        lung; possibly prostate  . Breast cancer Sister 75       negative genetic testing @ [facility]  . Prostate cancer Maternal Grandfather 36  . Breast cancer Maternal Aunt        Dx 66s; possibly also colon cancer  . Cancer Maternal Uncle        one with lung; one with oral  .  Breast cancer Sister 46       negative genetic testing @ [facility]  . Ovarian cancer Maternal Aunt        Dx 34s    Additionally, Ms. Franzoni has one son (age 23).  Both of her sisters had breast cancer (noted above) and both had negative genetic testing in 2017 utilizing expanded panels. Her mother (age 49) is cancer free and has not had a BSO.  Tina Ponce ancestry is Caucasian - NOS. There is no known Jewish ancestry and no consanguinity.  Discussion: We reviewed the characteristics, features and inheritance patterns of hereditary cancer syndromes. We discussed her risk of harboring a mutation in the context of her personal and family history, including the negative genetic testing that both of her sisters had. We discussed the process of genetic testing, insurance coverage and implications of results: positive, negative and variant of unknown significance (VUS).    Tina Ponce questions were answered to her satisfaction today and she is welcome to call with any additional questions or concerns. Thank you for the referral and allowing [place] to share in the care of your patient.    Steele Berg, MS, [place] Certified Genetic Counselor phone: [phone] [email or web address]   ______________________________________________________________________ For Office Staff:  Number of people involved in session: 1 Was an Intern/ student involved with case: no

## 2016-12-27 ENCOUNTER — Encounter: Payer: Self-pay | Admitting: General Practice

## 2016-12-27 ENCOUNTER — Telehealth: Payer: Self-pay | Admitting: *Deleted

## 2016-12-27 NOTE — Telephone Encounter (Signed)
Attempted to call patient but voicemail is full and unable to leave message.  Wanted to discuss starting anastrozole and going ahead with her hip surgery.  Will try again.

## 2016-12-27 NOTE — Progress Notes (Signed)
[facility] Psychosocial Distress Screening [place] by phone following [facility] to introduce [place] team/resources, reviewing distress screen per protocol.  The patient scored a 10 on the Psychosocial Distress Thermometer which indicates severe distress. Also assessed for distress and other psychosocial needs.   ONCBCN DISTRESS SCREENING 12/27/2016  Screening Type Initial Screening  Distress experienced in past week (1-10) 10  Practical problem type Housing;Insurance;Work/school  Family Problem type Children;Other (comment)  Emotional problem type Depression;Nervousness/Anxiety  Spiritual/Religous concerns type Loss of Faith  Physical Problem type Pain;Sleep/insomnia;Getting around;Bathing/dressing;Loss of appetitie  Referral to support programs Yes   Ms Viney reports that she is feeling much better after being so anxious and down prior to [facility].  Using humor she states, "I am very tested with my faith"--and also cites God as a source of strength through her challenges.  Per pt, her family cooperates well to support each other through hard times.  One sister lives with her and has MS, RSD, and hx breast cancer:  "I'm her arms, and she's my legs," says Georgina Peer.  Pt also states that they encourage one another:  "If she can do it, I can do it too!" Older sister is very involved in caregiving for their mom.  Starting Sunday, pt's 26yo son will be here temporarily for extra support.  Ms Ellerman hopes to have her two hips replaced prior to tackling radiation.  She welcomes f/u calls from [place] and is aware of ongoing [place] team/programming availability.  Follow up needed: Yes.  Plan to f/u by phone for further spiritual/emotional support (meaning-making, coping, utilizing faith and other tools), but please also page if immediate needs arise or circumstances change.  Thank you.   [place], [place], [facility] Pager  [phone] Voicemail [phone]

## 2016-12-30 ENCOUNTER — Telehealth: Payer: Self-pay | Admitting: *Deleted

## 2016-12-30 ENCOUNTER — Other Ambulatory Visit: Payer: Self-pay | Admitting: *Deleted

## 2016-12-30 DIAGNOSIS — Z170 Estrogen receptor positive status [ER+]: Principal | ICD-10-CM

## 2016-12-30 DIAGNOSIS — C50211 Malignant neoplasm of upper-inner quadrant of right female breast: Secondary | ICD-10-CM

## 2016-12-30 MED ORDER — ANASTROZOLE 1 MG PO TABS: 1.0000 mg | ORAL_TABLET | Freq: Every day | ORAL | 3 refills | Status: DC

## 2016-12-30 NOTE — Telephone Encounter (Signed)
Spoke with patient and she is going to move forward with her hip surgery 1st.  I have contacted her orthopedic surgeon and they are going to get her scheduled.  I have sent a prescription to her pharmacy for her anastrozole.

## 2016-12-31 ENCOUNTER — Encounter (HOSPITAL_COMMUNITY)
Admission: RE | Admit: 2016-12-31 | Discharge: 2016-12-31 | Disposition: A | Discharge status: Home / Self Care | Payer: PRIVATE HEALTH INSURANCE | Source: Ambulatory Visit | Attending: Orthopedic Surgery | Admitting: Orthopedic Surgery

## 2016-12-31 ENCOUNTER — Encounter (HOSPITAL_COMMUNITY): Payer: Self-pay

## 2016-12-31 DIAGNOSIS — I10 Essential (primary) hypertension: Secondary | ICD-10-CM | POA: Diagnosis not present

## 2016-12-31 DIAGNOSIS — F329 Major depressive disorder, single episode, unspecified: Secondary | ICD-10-CM | POA: Diagnosis not present

## 2016-12-31 DIAGNOSIS — I447 Left bundle-branch block, unspecified: Secondary | ICD-10-CM | POA: Insufficient documentation

## 2016-12-31 DIAGNOSIS — J45909 Unspecified asthma, uncomplicated: Secondary | ICD-10-CM | POA: Insufficient documentation

## 2016-12-31 DIAGNOSIS — K219 Gastro-esophageal reflux disease without esophagitis: Secondary | ICD-10-CM | POA: Insufficient documentation

## 2016-12-31 DIAGNOSIS — E785 Hyperlipidemia, unspecified: Secondary | ICD-10-CM | POA: Diagnosis not present

## 2016-12-31 DIAGNOSIS — Z9889 Other specified postprocedural states: Secondary | ICD-10-CM | POA: Insufficient documentation

## 2016-12-31 DIAGNOSIS — M13851 Other specified arthritis, right hip: Secondary | ICD-10-CM | POA: Insufficient documentation

## 2016-12-31 DIAGNOSIS — G4733 Obstructive sleep apnea (adult) (pediatric): Secondary | ICD-10-CM | POA: Diagnosis not present

## 2016-12-31 DIAGNOSIS — Z79899 Other long term (current) drug therapy: Secondary | ICD-10-CM | POA: Diagnosis not present

## 2016-12-31 DIAGNOSIS — D0581 Other specified type of carcinoma in situ of right breast: Secondary | ICD-10-CM | POA: Insufficient documentation

## 2016-12-31 DIAGNOSIS — E669 Obesity, unspecified: Secondary | ICD-10-CM | POA: Diagnosis not present

## 2016-12-31 DIAGNOSIS — Z01818 Encounter for other preprocedural examination: Secondary | ICD-10-CM | POA: Diagnosis not present

## 2016-12-31 DIAGNOSIS — F419 Anxiety disorder, unspecified: Secondary | ICD-10-CM | POA: Insufficient documentation

## 2016-12-31 HISTORY — DX: Headache: R51

## 2016-12-31 HISTORY — DX: Headache, unspecified: R51.9

## 2016-12-31 NOTE — Progress Notes (Signed)
Call to [facility] to resource records from Dr. Wynonia Lawman from when pt. Had visit  (8/18)& diagnostics with him in 2016. Call to [facility] for records if being held in anticipation for rescheduling which is happening on 01/07/2017. Pt. Denies all chest & breathing concerns now, but reports numerous issues contributing to her emotional status.

## 2016-12-31 NOTE — Pre-Procedure Instructions (Signed)
Tina Ponce  12/31/2016     Your procedure is scheduled on Tuesday, October 30.  Report to [facility] Admitting at 12:00 noon                     Your surgery or procedure is scheduled for  2:00PM   Call this number if you have problems the morning of surgery: [phone]- pre- op desk                For any other questions, please call [phone], Monday - Friday 8 AM - 4 PM.     Remember:  Do not eat food or drink liquids after midnight Monday 10/29  Take these medicines the morning of surgery with A SIP OF WATER:  anastrozole (ARIMIDEX)        If needed, you may take: acetaminophen (TYLENOL),    cyclobenzaprine (FLEXERIL),     ranitidine (ZANTAC),  SUMAtriptan (IMITREX), Zoloft  1 Week prior to surgery STOP taking Aspirin, Aspirin Products (Goody Powder, Excedrin Migraine), Ibuprofen (Advil), Naproxen (Aleve), Vitamin s and Herbal Products (ie Fish Oil)  Special instructions:   [place]- Preparing For Surgery  Before surgery, you can play an important role. Because skin is not sterile, your skin needs to be as free of germs as possible. You can reduce the number of germs on your skin by washing with CHG (chlorahexidine gluconate) Soap before surgery.  CHG is an antiseptic cleaner which kills germs and bonds with the skin to continue killing germs even after washing.  Please do not use if you have an allergy to CHG or antibacterial soaps. If your skin becomes reddened/irritated stop using the CHG.  Do not shave (including legs and underarms) for at least 48 hours prior to first CHG shower. It is OK to shave your face.  Please follow these instructions carefully.   1. Shower the NIGHT BEFORE SURGERY and the MORNING OF SURGERY with CHG.   2. If you chose to wash your hair, wash your hair first as usual with your normal shampoo.  3. After you shampoo, rinse your hair and body thoroughly to remove the shampoo.  4. Use CHG as you would any other liquid soap.  You can apply CHG directly to the skin and wash gently with a scrungie or a clean washcloth.   5. Apply the CHG Soap to your body ONLY FROM THE NECK DOWN.  Do not use on open wounds or open sores. Avoid contact with your eyes, ears, mouth and genitals (private parts). Wash Face and genitals (private parts)  with your normal soap.  6. Wash thoroughly, paying special attention to the area where your surgery will be performed.  7. Thoroughly rinse your body with warm water from the neck down.  8. DO NOT shower/wash with your normal soap after using and rinsing off the CHG Soap.  9. Pat yourself dry with a CLEAN TOWEL.  10. Wear CLEAN PAJAMAS to bed the night before surgery, wear comfortable clothes the morning of surgery  11. Place CLEAN SHEETS on your bed the night of your first shower and DO NOT SLEEP WITH PETS. 12.  Day of Surgery:   Shower as above  Do not apply any deodorants/lotions, powders or colognes.. Please wear clean clothes to the hospital/surgery center.     Do not wear jewelry, make-up or nail polish.   Do not wear lotions, powders, or perfumes, or deoderant.   Do not shave  48 hours prior to surgery.    Do not bring valuables to the hospital.   [facility] is not responsible for any belongings or valuables.  Contacts, dentures or bridgework may not be worn into surgery.  Leave your suitcase in the car.  After surgery it may be brought to your room.  For patients admitted to the hospital, discharge time will be determined by your treatment team.  Please read over the following fact sheets that you were given: Pain Booklet,  Incentive Spirometry, Surgical Site Infections.

## 2016-12-31 NOTE — Pre-Procedure Instructions (Signed)
Tina Ponce  12/31/2016     Your procedure is scheduled on Tuesday, October 30.  Report to [facility] Admitting at 12:00 noon                     Your surgery or procedure is scheduled for  2:00PM   Call this number if you have problems the morning of surgery: [phone]- pre- op desk                For any other questions, please call [phone], Monday - Friday 8 AM - 4 PM.     Remember:  Do not eat food or drink liquids after midnight Monday, October, 29.  Take these medicines the morning of surgery with A SIP OF WATER:  anastrozole (ARIMIDEX)        If needed, you may take: acetaminophen (TYLENOL),    cyclobenzaprine (FLEXERIL),     ranitidine (ZANTAC),  SUMAtriptan (IMITREX)  1 Week prior to surgery STOP taking Aspirin, Aspirin Products (Goody Powder, Excedrin Migraine), Ibuprofen (Advil), Naproxen (Aleve), Vitamin s and Herbal Products (ie Fish Oil)  Special instructions:   [place]- Preparing For Surgery  Before surgery, you can play an important role. Because skin is not sterile, your skin needs to be as free of germs as possible. You can reduce the number of germs on your skin by washing with CHG (chlorahexidine gluconate) Soap before surgery.  CHG is an antiseptic cleaner which kills germs and bonds with the skin to continue killing germs even after washing.  Please do not use if you have an allergy to CHG or antibacterial soaps. If your skin becomes reddened/irritated stop using the CHG.  Do not shave (including legs and underarms) for at least 48 hours prior to first CHG shower. It is OK to shave your face.  Please follow these instructions carefully.   1. Shower the NIGHT BEFORE SURGERY and the MORNING OF SURGERY with CHG.   2. If you chose to wash your hair, wash your hair first as usual with your normal shampoo.  3. After you shampoo, rinse your hair and body thoroughly to remove the shampoo.  4. Use CHG as you would any other liquid soap.  You can apply CHG directly to the skin and wash gently with a scrungie or a clean washcloth.   5. Apply the CHG Soap to your body ONLY FROM THE NECK DOWN.  Do not use on open wounds or open sores. Avoid contact with your eyes, ears, mouth and genitals (private parts). Wash Face and genitals (private parts)  with your normal soap.  6. Wash thoroughly, paying special attention to the area where your surgery will be performed.  7. Thoroughly rinse your body with warm water from the neck down.  8. DO NOT shower/wash with your normal soap after using and rinsing off the CHG Soap.  9. Pat yourself dry with a CLEAN TOWEL.  10. Wear CLEAN PAJAMAS to bed the night before surgery, wear comfortable clothes the morning of surgery  11. Place CLEAN SHEETS on your bed the night of your first shower and DO NOT SLEEP WITH PETS. 12.  Day of Surgery:   Shower as above  Do not apply any deodorants/lotions, powders or colognes.. Please wear clean clothes to the hospital/surgery center.     Do not wear jewelry, make-up or nail polish.   Do not wear lotions, powders, or perfumes, or deoderant.   Do not shave  48 hours prior to surgery.    Do not bring valuables to the hospital.   [facility] is not responsible for any belongings or valuables.  Contacts, dentures or bridgework may not be worn into surgery.  Leave your suitcase in the car.  After surgery it may be brought to your room.  For patients admitted to the hospital, discharge time will be determined by your treatment team.  Please read over the following fact sheets that you were given: Pain Booklet,  Incentive Spirometry, Surgical Site Infections.

## 2016-12-31 NOTE — Pre-Procedure Instructions (Signed)
Tina Ponce  12/31/2016     Your procedure is scheduled on Tuesday, October 30.  Report to [facility] Admitting at 12:00 noon                     Your surgery or procedure is scheduled for  2:00PM   Call this number if you have problems the morning of surgery: [phone]- pre- op desk                For any other questions, please call [phone], Monday - Friday 8 AM - 4 PM.     Remember:  Do not eat food or drink liquids after midnight Monday, October, 29.  Take these medicines the morning of surgery with A SIP OF WATER:  anastrozole (ARIMIDEX)        If needed, you may take: acetaminophen (TYLENOL),    cyclobenzaprine (FLEXERIL),     ranitidine (ZANTAC),  SUMAtriptan (IMITREX), Zoloft  1 Week prior to surgery STOP taking Aspirin, Aspirin Products (Goody Powder, Excedrin Migraine), Ibuprofen (Advil), Naproxen (Aleve), Vitamin s and Herbal Products (ie Fish Oil)  Special instructions:   [place]- Preparing For Surgery  Before surgery, you can play an important role. Because skin is not sterile, your skin needs to be as free of germs as possible. You can reduce the number of germs on your skin by washing with CHG (chlorahexidine gluconate) Soap before surgery.  CHG is an antiseptic cleaner which kills germs and bonds with the skin to continue killing germs even after washing.  Please do not use if you have an allergy to CHG or antibacterial soaps. If your skin becomes reddened/irritated stop using the CHG.  Do not shave (including legs and underarms) for at least 48 hours prior to first CHG shower. It is OK to shave your face.  Please follow these instructions carefully.   1. Shower the NIGHT BEFORE SURGERY and the MORNING OF SURGERY with CHG.   2. If you chose to wash your hair, wash your hair first as usual with your normal shampoo.  3. After you shampoo, rinse your hair and body thoroughly to remove the shampoo.  4. Use CHG as you would any other  liquid soap. You can apply CHG directly to the skin and wash gently with a scrungie or a clean washcloth.   5. Apply the CHG Soap to your body ONLY FROM THE NECK DOWN.  Do not use on open wounds or open sores. Avoid contact with your eyes, ears, mouth and genitals (private parts). Wash Face and genitals (private parts)  with your normal soap.  6. Wash thoroughly, paying special attention to the area where your surgery will be performed.  7. Thoroughly rinse your body with warm water from the neck down.  8. DO NOT shower/wash with your normal soap after using and rinsing off the CHG Soap.  9. Pat yourself dry with a CLEAN TOWEL.  10. Wear CLEAN PAJAMAS to bed the night before surgery, wear comfortable clothes the morning of surgery  11. Place CLEAN SHEETS on your bed the night of your first shower and DO NOT SLEEP WITH PETS. 12.  Day of Surgery:   Shower as above  Do not apply any deodorants/lotions, powders or colognes.. Please wear clean clothes to the hospital/surgery center.     Do not wear jewelry, make-up or nail polish.   Do not wear lotions, powders, or perfumes, or deoderant.   Do not  shave 48 hours prior to surgery.    Do not bring valuables to the hospital.   [facility] is not responsible for any belongings or valuables.  Contacts, dentures or bridgework may not be worn into surgery.  Leave your suitcase in the car.  After surgery it may be brought to your room.  For patients admitted to the hospital, discharge time will be determined by your treatment team.  Please read over the following fact sheets that you were given: Pain Booklet,  Incentive Spirometry, Surgical Site Infections.

## 2017-01-01 ENCOUNTER — Other Ambulatory Visit: Payer: Self-pay | Admitting: Orthopedic Surgery

## 2017-01-01 NOTE — Progress Notes (Signed)
Anesthesia Chart Review: Patient is a 51 year old female scheduled for right THA on 01/07/17 12/27/16 by Dr. Marchia Bond. Procedure was initially scheduled for 12/27/16, but was rescheduled due to right breast imaging concerning for breast cancer. 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 She has been evaluated by HEM-ONC Dr. Nicholas Lose and RAD-ONC Dr. Eppie Gibson. Based on currently available notes, patient would benefit from breast conserving surgery and oncotype DX testing to determine if chemotherapy would be of any benefit. Adjuvant antiestrogen therapy and post-surgery radiation have also been discussed. Patient wants to move forward with hip surgery first, so Dr. Lindi Adie started patient on anstrozole.    History includes former smoker (quit '07), HTN, HLD, left BBB, right breast cancer (new diagnosis), GERD, anxiety, depression, asthma, arthritis, OSA (CPAP), dental surgery. BMI is consistent with obesity.  - PCP is Dr. Ernestine Conrad. - Cardiologist is Dr. Viona Gilmore. Tollie Eth. She was seen on 10/25/16 for a preoperative evaluation. He has seen her two years prior for preoperative evaluation in the setting of left BBB. By notes, she had an echo, stress test, and Holter monitor. Stress test was negative, EF 53%. On 10/25/16 Dr. Wynonia Lawman wrote,"From a cardiac viewpoint may proceed with the planned hip replacement surgery. No additional cardiovascular workup is necessary..."   Meds include Xanax, anastrozole, Flexeril, Zantac, Zoloft, Imitrex.  BP 132/86   Pulse 99   Temp 36.7 C   Resp 18   Ht 5\' 8"  (1.727 m)   Wt 251 lb 3.2 oz (113.9 kg)   SpO2 97%   BMI 38.19 kg/m   EKG 10/25/16 (Dr. Wynonia Lawman): SR, left BBB.  Nuclear stress test 05/12/14 (Dr. Wynonia Lawman): Impression: 1. Normal Lexiscan Myoview scan with no evidence of ischemia or infarction. 2. Normal quantitative gated SPECT EF of 53% with normal wall motion and wall thickening. Recommendations: No evidence  of myocardial ischemia noted. Okay to proceed with bariatric surgery from a cardiac viewpoint.[Patient never had bariatric surgery.]  Echo 05/02/14 (Dr. Wynonia Lawman): Conclusions: - Mild concentric LVH with lower limits of normal LV systolic function. Abnormal septal motion c/w LBBB. Estimated EF 50%. - Moderate left atrial enlargement. - Mild mitral regurgitation. - Trace tricuspid and pulmonic regurgitation.  Dr. Thurman Coyer office does not have a Holter monitor result on file.   Labs on 12/25/16 noted. Cr 0.8. Glucose 98. CBC WNL.   If no acute changes then I anticipate that she can proceed as planned.  George Hugh [facility] Short Stay Center/Anesthesiology Phone [phone] 01/01/2017 11:38 AM

## 2017-01-01 NOTE — Progress Notes (Signed)
error 

## 2017-01-02 ENCOUNTER — Ambulatory Visit: Payer: Self-pay | Admitting: Genetic Counselor

## 2017-01-02 NOTE — Progress Notes (Signed)
[facility]   Patient Name: Tina Ponce Patient DOB: 1966/02/04 Encounter Date: 01/02/2017  Referring Provider: Nicholas Lose, MD  Reason for Call: Discuss results of genetic testing- 1st of 2 results   This is a brief note to document preliminary genetic test results.  A call was placed to Ms. Sanagustin today to discuss the first of her genetic test results. Her VM box was full and I could not leave a message.   Please see the Genetics note from her visit on 12/26/16. Due to time contraints and needing actionable results for medical management, [facility]'s STAT Breast panel was ordered first.  Preliminary Test Results: Genetic testing involved analysis of 9 genes: ATM, BRCA1, BRCA2, CDH1, CHEK2, PALB2, PTEN, STK11 and TP53 genes. Testing was normal and did not reveal a mutation in these genes. Testing is in process for the remaining genes on a larger gene panel.  Once results are obtained, Ms. Bossard will be called again. She does not need to wait to proceed with surgery.   Steele Berg, MS, [place] Certified Genetic Counselor phone: [phone]

## 2017-01-02 NOTE — Progress Notes (Signed)
Ms. Arantxa was reached and results disclosed. She will proceed with the Common Cancers panel (47 genes).

## 2017-01-06 ENCOUNTER — Encounter: Payer: Self-pay | Admitting: General Practice

## 2017-01-06 MED ORDER — CEFAZOLIN SODIUM-DEXTROSE 2-4 GM/100ML-% IV SOLN
2.0000 g | INTRAVENOUS | Status: AC
  Administered 2017-01-07: 2 g via INTRAVENOUS
  Filled 2017-01-06: qty 100

## 2017-01-06 NOTE — Progress Notes (Signed)
[facility] Spiritual Care Note  Followed up with Ms Tina Ponce by phone as planned.  She was in good spirits today, having celebrated her birthday yesterday with family and eagerly awaiting her first hip replacement surgery tomorrow.  Per pt, she is glad that her son has arrived and is overall coping well with the death of her cat.  She values spiritual/emotional support and knows to contact Spiritual Care/Support Team anytime desired.  Also plan to send her a handwritten card to encourage her after her surgery.   [place], [place], [facility] Pager [phone] Voicemail [phone]

## 2017-01-07 ENCOUNTER — Encounter (HOSPITAL_COMMUNITY): Payer: Self-pay | Admitting: Certified Registered Nurse Anesthetist

## 2017-01-07 ENCOUNTER — Inpatient Hospital Stay (HOSPITAL_COMMUNITY): Payer: No Typology Code available for payment source

## 2017-01-07 ENCOUNTER — Inpatient Hospital Stay (HOSPITAL_COMMUNITY)
Admission: RE | Admit: 2017-01-07 | Discharge: 2017-01-08 | DRG: 470 | Disposition: A | Discharge status: Home / Self Care | Payer: No Typology Code available for payment source | Source: Ambulatory Visit | Attending: Orthopedic Surgery | Admitting: Orthopedic Surgery

## 2017-01-07 ENCOUNTER — Encounter (HOSPITAL_COMMUNITY)
Admission: RE | Disposition: A | Discharge status: Home / Self Care | Payer: Self-pay | Source: Ambulatory Visit | Attending: Orthopedic Surgery

## 2017-01-07 ENCOUNTER — Inpatient Hospital Stay (HOSPITAL_COMMUNITY): Payer: No Typology Code available for payment source | Admitting: Certified Registered Nurse Anesthetist

## 2017-01-07 ENCOUNTER — Inpatient Hospital Stay (HOSPITAL_COMMUNITY): Payer: No Typology Code available for payment source | Admitting: Vascular Surgery

## 2017-01-07 DIAGNOSIS — Z803 Family history of malignant neoplasm of breast: Secondary | ICD-10-CM

## 2017-01-07 DIAGNOSIS — M1610 Unilateral primary osteoarthritis, unspecified hip: Secondary | ICD-10-CM

## 2017-01-07 DIAGNOSIS — Z6838 Body mass index (BMI) 38.0-38.9, adult: Secondary | ICD-10-CM | POA: Diagnosis not present

## 2017-01-07 DIAGNOSIS — K219 Gastro-esophageal reflux disease without esophagitis: Secondary | ICD-10-CM | POA: Diagnosis present

## 2017-01-07 DIAGNOSIS — M1611 Unilateral primary osteoarthritis, right hip: Principal | ICD-10-CM

## 2017-01-07 DIAGNOSIS — I10 Essential (primary) hypertension: Secondary | ICD-10-CM | POA: Diagnosis present

## 2017-01-07 DIAGNOSIS — E669 Obesity, unspecified: Secondary | ICD-10-CM | POA: Diagnosis present

## 2017-01-07 DIAGNOSIS — G4730 Sleep apnea, unspecified: Secondary | ICD-10-CM | POA: Diagnosis present

## 2017-01-07 DIAGNOSIS — E785 Hyperlipidemia, unspecified: Secondary | ICD-10-CM | POA: Diagnosis present

## 2017-01-07 DIAGNOSIS — Z853 Personal history of malignant neoplasm of breast: Secondary | ICD-10-CM

## 2017-01-07 DIAGNOSIS — Z87891 Personal history of nicotine dependence: Secondary | ICD-10-CM

## 2017-01-07 HISTORY — DX: Unilateral primary osteoarthritis, right hip: M16.11

## 2017-01-07 HISTORY — DX: Malignant (primary) neoplasm, unspecified: C80.1

## 2017-01-07 HISTORY — PX: TOTAL HIP ARTHROPLASTY: SHX124

## 2017-01-07 SURGERY — ARTHROPLASTY, HIP, TOTAL,POSTERIOR APPROACH
Anesthesia: Spinal | Site: Hip | Laterality: Right

## 2017-01-07 MED ORDER — SODIUM CHLORIDE 0.9 % IR SOLN
Status: DC | PRN
  Administered 2017-01-07: 1000 mL

## 2017-01-07 MED ORDER — ACETAMINOPHEN 500 MG PO TABS: 1000.0000 mg | ORAL_TABLET | ORAL | Status: DC

## 2017-01-07 MED ORDER — PHENYLEPHRINE 40 MCG/ML (10ML) SYRINGE FOR IV PUSH (FOR BLOOD PRESSURE SUPPORT)
PREFILLED_SYRINGE | INTRAVENOUS | Status: AC
  Filled 2017-01-07: qty 10

## 2017-01-07 MED ORDER — KETOROLAC TROMETHAMINE 15 MG/ML IJ SOLN
15.0000 mg | Freq: Four times a day (QID) | INTRAMUSCULAR | Status: DC
  Administered 2017-01-08 (×2): 15 mg via INTRAVENOUS
  Filled 2017-01-07 (×2): qty 1

## 2017-01-07 MED ORDER — KETAMINE HCL 10 MG/ML IJ SOLN
INTRAMUSCULAR | Status: DC | PRN
  Administered 2017-01-07: 30 mg via INTRAVENOUS

## 2017-01-07 MED ORDER — ONDANSETRON HCL 4 MG PO TABS: 4.0000 mg | ORAL_TABLET | Freq: Three times a day (TID) | ORAL | 0 refills | Status: DC | PRN

## 2017-01-07 MED ORDER — OXYCODONE HCL 5 MG PO TABS
ORAL_TABLET | ORAL | Status: AC
  Filled 2017-01-07: qty 2

## 2017-01-07 MED ORDER — ONDANSETRON HCL 4 MG PO TABS: 4.0000 mg | ORAL_TABLET | Freq: Four times a day (QID) | ORAL | Status: DC | PRN

## 2017-01-07 MED ORDER — FENTANYL CITRATE (PF) 100 MCG/2ML IJ SOLN
INTRAMUSCULAR | Status: AC
  Filled 2017-01-07: qty 2

## 2017-01-07 MED ORDER — OXYCODONE HCL 5 MG PO TABS: 5.0000 mg | ORAL_TABLET | ORAL | Status: DC | PRN

## 2017-01-07 MED ORDER — PHENOL 1.4 % MT LIQD: 1.0000 | OROMUCOSAL | Status: DC | PRN

## 2017-01-07 MED ORDER — 0.9 % SODIUM CHLORIDE (POUR BTL) OPTIME
TOPICAL | Status: DC | PRN
  Administered 2017-01-07: 1000 mL

## 2017-01-07 MED ORDER — FENTANYL CITRATE (PF) 100 MCG/2ML IJ SOLN
INTRAMUSCULAR | Status: DC | PRN
  Administered 2017-01-07 (×5): 50 ug via INTRAVENOUS

## 2017-01-07 MED ORDER — LACTATED RINGERS IV SOLN
INTRAVENOUS | Status: DC
  Administered 2017-01-07: 50 mL/h via INTRAVENOUS

## 2017-01-07 MED ORDER — HYDROMORPHONE HCL 1 MG/ML IJ SOLN
1.0000 mg | INTRAMUSCULAR | Status: DC | PRN
  Administered 2017-01-08 (×2): 1 mg via INTRAVENOUS
  Filled 2017-01-07 (×2): qty 1

## 2017-01-07 MED ORDER — CEFAZOLIN SODIUM-DEXTROSE 2-4 GM/100ML-% IV SOLN
2.0000 g | Freq: Four times a day (QID) | INTRAVENOUS | Status: AC
  Administered 2017-01-07 – 2017-01-08 (×2): 2 g via INTRAVENOUS
  Filled 2017-01-07 (×2): qty 100

## 2017-01-07 MED ORDER — ANASTROZOLE 1 MG PO TABS
1.0000 mg | ORAL_TABLET | Freq: Every day | ORAL | Status: DC
  Administered 2017-01-07: 1 mg via ORAL
  Filled 2017-01-07 (×2): qty 1

## 2017-01-07 MED ORDER — METOCLOPRAMIDE HCL 5 MG/ML IJ SOLN: 5.0000 mg | Freq: Three times a day (TID) | INTRAMUSCULAR | Status: DC | PRN

## 2017-01-07 MED ORDER — PHENYLEPHRINE 40 MCG/ML (10ML) SYRINGE FOR IV PUSH (FOR BLOOD PRESSURE SUPPORT)
PREFILLED_SYRINGE | INTRAVENOUS | Status: DC | PRN
  Administered 2017-01-07: 80 ug via INTRAVENOUS
  Administered 2017-01-07: 120 ug via INTRAVENOUS
  Administered 2017-01-07 (×2): 80 ug via INTRAVENOUS

## 2017-01-07 MED ORDER — SERTRALINE HCL 100 MG PO TABS
100.0000 mg | ORAL_TABLET | Freq: Two times a day (BID) | ORAL | Status: DC
  Administered 2017-01-07 – 2017-01-08 (×2): 100 mg via ORAL
  Filled 2017-01-07 (×2): qty 1

## 2017-01-07 MED ORDER — EPHEDRINE 5 MG/ML INJ
INTRAVENOUS | Status: AC
  Filled 2017-01-07: qty 10

## 2017-01-07 MED ORDER — KETOROLAC TROMETHAMINE 30 MG/ML IJ SOLN
30.0000 mg | Freq: Once | INTRAMUSCULAR | Status: DC | PRN
  Administered 2017-01-07: 30 mg via INTRAVENOUS

## 2017-01-07 MED ORDER — FENTANYL CITRATE (PF) 100 MCG/2ML IJ SOLN
25.0000 ug | INTRAMUSCULAR | Status: DC | PRN
  Administered 2017-01-07: 50 ug via INTRAVENOUS

## 2017-01-07 MED ORDER — MIDAZOLAM HCL 2 MG/2ML IJ SOLN
INTRAMUSCULAR | Status: AC
  Filled 2017-01-07: qty 2

## 2017-01-07 MED ORDER — CELECOXIB 200 MG PO CAPS: 200.0000 mg | ORAL_CAPSULE | ORAL | Status: DC

## 2017-01-07 MED ORDER — ONDANSETRON HCL 4 MG/2ML IJ SOLN
INTRAMUSCULAR | Status: DC | PRN
  Administered 2017-01-07: 4 mg via INTRAVENOUS

## 2017-01-07 MED ORDER — RIVAROXABAN 10 MG PO TABS
10.0000 mg | ORAL_TABLET | Freq: Every day | ORAL | Status: DC
  Administered 2017-01-08: 10 mg via ORAL
  Filled 2017-01-07: qty 1

## 2017-01-07 MED ORDER — DEXTROSE 5 % IV SOLN
INTRAVENOUS | Status: DC | PRN
  Administered 2017-01-07: 50 ug/min via INTRAVENOUS

## 2017-01-07 MED ORDER — DOCUSATE SODIUM 100 MG PO CAPS
100.0000 mg | ORAL_CAPSULE | Freq: Two times a day (BID) | ORAL | Status: DC
  Administered 2017-01-07 – 2017-01-08 (×2): 100 mg via ORAL
  Filled 2017-01-07 (×2): qty 1

## 2017-01-07 MED ORDER — METOCLOPRAMIDE HCL 5 MG PO TABS: 5.0000 mg | ORAL_TABLET | Freq: Three times a day (TID) | ORAL | Status: DC | PRN

## 2017-01-07 MED ORDER — MIDAZOLAM HCL 5 MG/5ML IJ SOLN
INTRAMUSCULAR | Status: DC | PRN
  Administered 2017-01-07: 2 mg via INTRAVENOUS

## 2017-01-07 MED ORDER — SUMATRIPTAN SUCCINATE 100 MG PO TABS
100.0000 mg | ORAL_TABLET | ORAL | Status: DC | PRN
  Filled 2017-01-07: qty 1

## 2017-01-07 MED ORDER — MEPERIDINE HCL 25 MG/ML IJ SOLN: 6.2500 mg | INTRAMUSCULAR | Status: DC | PRN

## 2017-01-07 MED ORDER — CHLORHEXIDINE GLUCONATE CLOTH 2 % EX PADS: 6.0000 | MEDICATED_PAD | Freq: Once | CUTANEOUS | Status: DC

## 2017-01-07 MED ORDER — SENNA-DOCUSATE SODIUM 8.6-50 MG PO TABS: 2.0000 | ORAL_TABLET | Freq: Every day | ORAL | 1 refills | Status: DC

## 2017-01-07 MED ORDER — DEXTROSE 5 % IV SOLN
500.0000 mg | Freq: Four times a day (QID) | INTRAVENOUS | Status: DC | PRN
  Filled 2017-01-07: qty 5

## 2017-01-07 MED ORDER — KETOROLAC TROMETHAMINE 30 MG/ML IJ SOLN
INTRAMUSCULAR | Status: AC
  Filled 2017-01-07: qty 1

## 2017-01-07 MED ORDER — BUPIVACAINE IN DEXTROSE 0.75-8.25 % IT SOLN
INTRATHECAL | Status: DC | PRN
  Administered 2017-01-07: 15 mg via INTRATHECAL

## 2017-01-07 MED ORDER — PROPOFOL 10 MG/ML IV BOLUS
INTRAVENOUS | Status: DC | PRN
  Administered 2017-01-07: 30 mg via INTRAVENOUS
  Administered 2017-01-07: 20 mg via INTRAVENOUS
  Administered 2017-01-07 (×2): 30 mg via INTRAVENOUS
  Administered 2017-01-07: 20 mg via INTRAVENOUS

## 2017-01-07 MED ORDER — ONDANSETRON HCL 4 MG/2ML IJ SOLN
INTRAMUSCULAR | Status: AC
  Filled 2017-01-07: qty 2

## 2017-01-07 MED ORDER — MENTHOL 3 MG MT LOZG: 1.0000 | LOZENGE | OROMUCOSAL | Status: DC | PRN

## 2017-01-07 MED ORDER — DEXAMETHASONE SODIUM PHOSPHATE 4 MG/ML IJ SOLN
INTRAMUSCULAR | Status: DC | PRN
  Administered 2017-01-07: 8 mg via INTRAVENOUS

## 2017-01-07 MED ORDER — OXYCODONE HCL 5 MG PO TABS
10.0000 mg | ORAL_TABLET | ORAL | Status: DC | PRN
  Administered 2017-01-07 (×2): 10 mg via ORAL
  Filled 2017-01-07: qty 2

## 2017-01-07 MED ORDER — POTASSIUM CHLORIDE IN NACL 20-0.45 MEQ/L-% IV SOLN
INTRAVENOUS | Status: DC
  Administered 2017-01-07: 21:00:00 via INTRAVENOUS
  Filled 2017-01-07 (×2): qty 1000

## 2017-01-07 MED ORDER — DEXAMETHASONE SODIUM PHOSPHATE 10 MG/ML IJ SOLN
10.0000 mg | Freq: Once | INTRAMUSCULAR | Status: AC
  Administered 2017-01-08: 10 mg via INTRAVENOUS
  Filled 2017-01-07: qty 1

## 2017-01-07 MED ORDER — METHOCARBAMOL 500 MG PO TABS
ORAL_TABLET | ORAL | Status: AC
  Filled 2017-01-07: qty 1

## 2017-01-07 MED ORDER — EPHEDRINE SULFATE-NACL 50-0.9 MG/10ML-% IV SOSY
PREFILLED_SYRINGE | INTRAVENOUS | Status: DC | PRN
  Administered 2017-01-07: 5 mg via INTRAVENOUS

## 2017-01-07 MED ORDER — DIPHENHYDRAMINE HCL 12.5 MG/5ML PO ELIX: 12.5000 mg | ORAL_SOLUTION | ORAL | Status: DC | PRN

## 2017-01-07 MED ORDER — PROMETHAZINE HCL 25 MG/ML IJ SOLN: 6.2500 mg | INTRAMUSCULAR | Status: DC | PRN

## 2017-01-07 MED ORDER — KETAMINE HCL-SODIUM CHLORIDE 100-0.9 MG/10ML-% IV SOSY
PREFILLED_SYRINGE | INTRAVENOUS | Status: AC
  Filled 2017-01-07: qty 10

## 2017-01-07 MED ORDER — FAMOTIDINE 20 MG PO TABS
20.0000 mg | ORAL_TABLET | Freq: Two times a day (BID) | ORAL | Status: DC
  Administered 2017-01-07 – 2017-01-08 (×2): 20 mg via ORAL
  Filled 2017-01-07 (×2): qty 1

## 2017-01-07 MED ORDER — ACETAMINOPHEN 325 MG PO TABS: 650.0000 mg | ORAL_TABLET | ORAL | Status: DC | PRN

## 2017-01-07 MED ORDER — LIDOCAINE 2% (20 MG/ML) 5 ML SYRINGE
INTRAMUSCULAR | Status: AC
  Filled 2017-01-07: qty 5

## 2017-01-07 MED ORDER — POLYETHYLENE GLYCOL 3350 17 G PO PACK: 17.0000 g | PACK | Freq: Every day | ORAL | Status: DC | PRN

## 2017-01-07 MED ORDER — SENNA 8.6 MG PO TABS
1.0000 | ORAL_TABLET | Freq: Two times a day (BID) | ORAL | Status: DC
  Administered 2017-01-08: 8.6 mg via ORAL
  Filled 2017-01-07: qty 1

## 2017-01-07 MED ORDER — ALUM & MAG HYDROXIDE-SIMETH 200-200-20 MG/5ML PO SUSP: 30.0000 mL | ORAL | Status: DC | PRN

## 2017-01-07 MED ORDER — ONDANSETRON HCL 4 MG/2ML IJ SOLN: 4.0000 mg | Freq: Four times a day (QID) | INTRAMUSCULAR | Status: DC | PRN

## 2017-01-07 MED ORDER — ACETAMINOPHEN 650 MG RE SUPP
650.0000 mg | RECTAL | Status: DC | PRN
Start: 2017-01-07 — End: 2017-01-08

## 2017-01-07 MED ORDER — LIDOCAINE 2% (20 MG/ML) 5 ML SYRINGE
INTRAMUSCULAR | Status: DC | PRN
  Administered 2017-01-07: 40 mg via INTRAVENOUS

## 2017-01-07 MED ORDER — OXYCODONE HCL 5 MG PO TABS: 5.0000 mg | ORAL_TABLET | ORAL | 0 refills | Status: DC | PRN

## 2017-01-07 MED ORDER — DEXAMETHASONE SODIUM PHOSPHATE 10 MG/ML IJ SOLN
INTRAMUSCULAR | Status: AC
  Filled 2017-01-07: qty 1

## 2017-01-07 MED ORDER — RIVAROXABAN 10 MG PO TABS: 10.0000 mg | ORAL_TABLET | Freq: Every day | ORAL | 0 refills | Status: DC

## 2017-01-07 MED ORDER — ALPRAZOLAM 0.5 MG PO TABS
0.5000 mg | ORAL_TABLET | Freq: Three times a day (TID) | ORAL | Status: DC | PRN
  Administered 2017-01-07: 0.5 mg via ORAL
  Filled 2017-01-07: qty 1

## 2017-01-07 MED ORDER — ACETAMINOPHEN 500 MG PO TABS
1000.0000 mg | ORAL_TABLET | Freq: Four times a day (QID) | ORAL | Status: DC
  Administered 2017-01-08 (×2): 1000 mg via ORAL
  Filled 2017-01-07 (×2): qty 2

## 2017-01-07 MED ORDER — LACTATED RINGERS IV SOLN
INTRAVENOUS | Status: DC | PRN
  Administered 2017-01-07 (×2): via INTRAVENOUS

## 2017-01-07 MED ORDER — GABAPENTIN 300 MG PO CAPS
300.0000 mg | ORAL_CAPSULE | Freq: Three times a day (TID) | ORAL | Status: DC
  Administered 2017-01-07 – 2017-01-08 (×2): 300 mg via ORAL
  Filled 2017-01-07 (×2): qty 1

## 2017-01-07 MED ORDER — PROPOFOL 500 MG/50ML IV EMUL
INTRAVENOUS | Status: DC | PRN
  Administered 2017-01-07: 50 ug/kg/min via INTRAVENOUS

## 2017-01-07 MED ORDER — ZOLPIDEM TARTRATE 5 MG PO TABS
5.0000 mg | ORAL_TABLET | Freq: Every evening | ORAL | Status: DC | PRN
  Administered 2017-01-07: 5 mg via ORAL
  Filled 2017-01-07: qty 1

## 2017-01-07 MED ORDER — GABAPENTIN 300 MG PO CAPS: 300.0000 mg | ORAL_CAPSULE | ORAL | Status: DC

## 2017-01-07 MED ORDER — METHOCARBAMOL 500 MG PO TABS
500.0000 mg | ORAL_TABLET | Freq: Four times a day (QID) | ORAL | Status: DC | PRN
  Administered 2017-01-07: 500 mg via ORAL

## 2017-01-07 MED ORDER — BISACODYL 10 MG RE SUPP
10.0000 mg | Freq: Every day | RECTAL | Status: DC | PRN
Start: 2017-01-07 — End: 2017-01-08

## 2017-01-07 MED ORDER — MAGNESIUM CITRATE PO SOLN: 1.0000 | Freq: Once | ORAL | Status: DC | PRN

## 2017-01-07 MED ORDER — CYCLOBENZAPRINE HCL 10 MG PO TABS: 10.0000 mg | ORAL_TABLET | Freq: Three times a day (TID) | ORAL | 0 refills | Status: DC | PRN

## 2017-01-07 MED ORDER — FENTANYL CITRATE (PF) 250 MCG/5ML IJ SOLN
INTRAMUSCULAR | Status: AC
  Filled 2017-01-07: qty 5

## 2017-01-07 SURGICAL SUPPLY — 61 items
BIT DRILL 5/64X5 DISP (BIT) ×2 IMPLANT
BLADE SAW SAG 73X25 THK (BLADE) ×1
BLADE SAW SGTL 73X25 THK (BLADE) ×1 IMPLANT
CAPT HIP TOTAL 2 ×1 IMPLANT
CLSR STERI-STRIP ANTIMIC 1/2X4 (GAUZE/BANDAGES/DRESSINGS) ×3 IMPLANT
COVER SURGICAL LIGHT HANDLE (MISCELLANEOUS) ×2 IMPLANT
DRAPE INCISE IOBAN 66X45 STRL (DRAPES) ×1 IMPLANT
DRAPE ORTHO SPLIT 77X108 STRL (DRAPES) ×4
DRAPE SURG ORHT 6 SPLT 77X108 (DRAPES) ×2 IMPLANT
DRAPE U-SHAPE 47X51 STRL (DRAPES) ×2 IMPLANT
DRSG MEPILEX BORDER 4X12 (GAUZE/BANDAGES/DRESSINGS) ×1 IMPLANT
DRSG MEPILEX BORDER 4X8 (GAUZE/BANDAGES/DRESSINGS) IMPLANT
DURAPREP 26ML APPLICATOR (WOUND CARE) ×2 IMPLANT
ELECT BLADE 4.0 EZ CLEAN MEGAD (MISCELLANEOUS) ×2
ELECT CAUTERY BLADE 6.4 (BLADE) ×2 IMPLANT
ELECT REM PT RETURN 9FT ADLT (ELECTROSURGICAL) ×2
ELECTRODE BLDE 4.0 EZ CLN MEGD (MISCELLANEOUS) IMPLANT
ELECTRODE REM PT RTRN 9FT ADLT (ELECTROSURGICAL) ×1 IMPLANT
GLOVE BIOGEL PI IND STRL 6.5 (GLOVE) IMPLANT
GLOVE BIOGEL PI IND STRL 7.0 (GLOVE) IMPLANT
GLOVE BIOGEL PI INDICATOR 6.5 (GLOVE) ×1
GLOVE BIOGEL PI INDICATOR 7.0 (GLOVE) ×1
GLOVE BIOGEL PI ORTHO PRO SZ8 (GLOVE) ×2
GLOVE ORTHO TXT STRL SZ7.5 (GLOVE) ×1 IMPLANT
GLOVE PI ORTHO PRO STRL SZ8 (GLOVE) ×2 IMPLANT
GLOVE SURG ORTHO 8.0 STRL STRW (GLOVE) ×1 IMPLANT
GLOVE SURG SS PI 6.0 STRL IVOR (GLOVE) ×2 IMPLANT
GLOVE SURG SS PI 7.5 STRL IVOR (GLOVE) ×2 IMPLANT
GLOVE SURG SS PI 8.0 STRL IVOR (GLOVE) ×1 IMPLANT
GOWN STRL REUS W/ TWL LRG LVL3 (GOWN DISPOSABLE) IMPLANT
GOWN STRL REUS W/ TWL XL LVL3 (GOWN DISPOSABLE) ×1 IMPLANT
GOWN STRL REUS W/TWL 2XL LVL3 (GOWN DISPOSABLE) ×2 IMPLANT
GOWN STRL REUS W/TWL LRG LVL3 (GOWN DISPOSABLE) ×4
GOWN STRL REUS W/TWL XL LVL3 (GOWN DISPOSABLE) ×2
HOOD PEEL AWAY FACE SHEILD DIS (HOOD) ×3 IMPLANT
HOOD PEEL AWAY FLYTE STAYCOOL (MISCELLANEOUS) ×2 IMPLANT
KIT BASIN OR (CUSTOM PROCEDURE TRAY) ×2 IMPLANT
KIT ROOM TURNOVER OR (KITS) ×2 IMPLANT
MANIFOLD NEPTUNE II (INSTRUMENTS) ×2 IMPLANT
NDL SAFETY ECLIPSE 18X1.5 (NEEDLE) ×1 IMPLANT
NEEDLE HYPO 18GX1.5 SHARP (NEEDLE)
NS IRRIG 1000ML POUR BTL (IV SOLUTION) ×2 IMPLANT
PACK TOTAL JOINT (CUSTOM PROCEDURE TRAY) ×2 IMPLANT
PAD ARMBOARD 7.5X6 YLW CONV (MISCELLANEOUS) ×4 IMPLANT
PILLOW ABDUCTION HIP (SOFTGOODS) ×2 IMPLANT
PRESSURIZER FEMORAL UNIV (MISCELLANEOUS) IMPLANT
RETRIEVER SUT HEWSON (MISCELLANEOUS) ×3 IMPLANT
SUCTION FRAZIER HANDLE 10FR (MISCELLANEOUS) ×1
SUCTION TUBE FRAZIER 10FR DISP (MISCELLANEOUS) ×1 IMPLANT
SUT FIBERWIRE #2 38 REV NDL BL (SUTURE) ×6
SUT VIC AB 0 CT1 27 (SUTURE) ×4
SUT VIC AB 0 CT1 27XBRD ANBCTR (SUTURE) ×1 IMPLANT
SUT VIC AB 2-0 CT1 27 (SUTURE) ×4
SUT VIC AB 2-0 CT1 TAPERPNT 27 (SUTURE) ×1 IMPLANT
SUT VIC AB 3-0 SH 8-18 (SUTURE) ×2 IMPLANT
SUTURE FIBERWR#2 38 REV NDL BL (SUTURE) ×3 IMPLANT
SYR CONTROL 10ML LL (SYRINGE) ×1 IMPLANT
TOWEL OR 17X24 6PK STRL BLUE (TOWEL DISPOSABLE) ×2 IMPLANT
TOWEL OR 17X26 10 PK STRL BLUE (TOWEL DISPOSABLE) ×2 IMPLANT
TRAY CATH 16FR W/PLASTIC CATH (SET/KITS/TRAYS/PACK) ×1 IMPLANT
TRAY FOLEY CATH SILVER 14FR (SET/KITS/TRAYS/PACK) IMPLANT

## 2017-01-07 NOTE — Transfer of Care (Signed)
Immediate Anesthesia Transfer of Care Note  Patient: Tina Ponce  Procedure(s) Performed: TOTAL HIP ARTHROPLASTY (Right Hip)  Patient Location: PACU  Anesthesia Type:Spinal  Level of Consciousness: awake, alert , oriented and patient cooperative  Airway & Oxygen Therapy: Patient Spontanous Breathing and Patient connected to face mask oxygen  Post-op Assessment: Report given to RN and Post -op Vital signs reviewed and stable  Post vital signs: Reviewed and stable  Last Vitals:  Vitals:   01/07/17 1208 01/07/17 1752  BP: 128/78 117/73  Pulse: 80 69  Resp: 18 11  Temp: 36.9 C 36.6 C  SpO2: 97% 100%    Last Pain:  Vitals:   01/07/17 1244  TempSrc:   PainSc: 7          Complications: No apparent anesthesia complications

## 2017-01-07 NOTE — Anesthesia Procedure Notes (Signed)
Procedure Name: MAC Date/Time: 01/07/2017 3:20 PM Performed by: Orlie Dakin Pre-anesthesia Checklist: Patient identified, Emergency Drugs available, Suction available, Patient being monitored and Timeout performed Patient Re-evaluated:Patient Re-evaluated prior to induction Oxygen Delivery Method: Nasal cannula Preoxygenation: Pre-oxygenation with 100% oxygen

## 2017-01-07 NOTE — Anesthesia Postprocedure Evaluation (Signed)
Anesthesia Post Note  Patient: ZHANNA MELIN  Procedure(s) Performed: TOTAL HIP ARTHROPLASTY (Right Hip)     Patient location during evaluation: PACU Anesthesia Type: Spinal Level of consciousness: oriented and awake and alert Pain management: pain level controlled Vital Signs Assessment: post-procedure vital signs reviewed and stable Respiratory status: spontaneous breathing, respiratory function stable and patient connected to nasal cannula oxygen Cardiovascular status: blood pressure returned to baseline and stable Postop Assessment: no headache, no backache and no apparent nausea or vomiting Anesthetic complications: no    Last Vitals:  Vitals:   01/07/17 1819 01/07/17 1835  BP: 111/63 112/79  Pulse: 61 72  Resp: 15 16  Temp: 36.7 C 36.6 C  SpO2: 96% 100%    Last Pain:  Vitals:   01/07/17 1857  TempSrc:   PainSc: 3                  Ryan P Ellender

## 2017-01-07 NOTE — Anesthesia Preprocedure Evaluation (Signed)
Anesthesia Evaluation  Patient identified by MRN, date of birth, ID band Patient awake    Reviewed: Allergy & Precautions, NPO status , Patient's Chart, lab work & pertinent test results  Airway Mallampati: II  TM Distance: >3 FB Neck ROM: Full    Dental no notable dental hx.    Pulmonary sleep apnea , former smoker,    Pulmonary exam normal breath sounds clear to auscultation       Cardiovascular hypertension, Pt. on medications Normal cardiovascular exam+ dysrhythmias  Rhythm:Regular Rate:Normal     Neuro/Psych  Headaches, PSYCHIATRIC DISORDERS Anxiety Depression    GI/Hepatic Neg liver ROS, GERD  ,  Endo/Other  negative endocrine ROS  Renal/GU negative Renal ROS     Musculoskeletal  (+) Arthritis ,   Abdominal (+) + obese,   Peds  Hematology negative hematology ROS (+)   Anesthesia Other Findings   Reproductive/Obstetrics negative OB ROS                             Anesthesia Physical Anesthesia Plan  ASA: II  Anesthesia Plan: Spinal   Post-op Pain Management:    Induction:   PONV Risk Score and Plan: 3 and Ondansetron, Dexamethasone, Midazolam and Treatment may vary due to age or medical condition  Airway Management Planned:   Additional Equipment:   Intra-op Plan:   Post-operative Plan:   Informed Consent: I have reviewed the patients History and Physical, chart, labs and discussed the procedure including the risks, benefits and alternatives for the proposed anesthesia with the patient or authorized representative who has indicated his/her understanding and acceptance.   Dental advisory given  Plan Discussed with: CRNA  Anesthesia Plan Comments:         Anesthesia Quick Evaluation

## 2017-01-07 NOTE — Op Note (Signed)
01/07/2017  5:04 PM  PATIENT:  Tina Ponce   MRN: [id number]  PRE-OPERATIVE DIAGNOSIS:  Primary localized osteoarthritis of right hip  POST-OPERATIVE DIAGNOSIS:  Primary localized osteoarthritis of right hip  PROCEDURE:  Procedure(s): TOTAL HIP ARTHROPLASTY  PREOPERATIVE INDICATIONS:    Tina Ponce is an 51 y.o. female who has a diagnosis of Primary localized osteoarthritis of right hip and elected for surgical management after failing conservative treatment.  The risks benefits and alternatives were discussed with the patient including but not limited to the risks of nonoperative treatment, versus surgical intervention including infection, bleeding, nerve injury, periprosthetic fracture, the need for revision surgery, dislocation, leg length discrepancy, blood clots, cardiopulmonary complications, morbidity, mortality, among others, and they were willing to proceed.     OPERATIVE REPORT     SURGEON:  Marchia Bond, MD    ASSISTANT:  Joya Gaskins, OPA-C  (Present throughout the entire procedure,  necessary for completion of procedure in a timely manner, assisting with retraction, instrumentation, and closure)     ANESTHESIA: Spinal  ESTIMATED BLOOD LOSS: 939 mL    COMPLICATIONS:  None.     UNIQUE ASPECTS OF THE CASE: I debated about the leg length, she had a little bit of shock, but was completely stable, I ended up using a +1.5 head, rather than the 5, because this 1.5 had better soft tissue connection for the posterior capsular repair, the 5 did not really reach.  I know that I am planning on doing her contralateral side when she recovers, however I did not think that she needed to be over lengthened with the current operation.  I had excellent press-fit of the cup, but did use a screw just for backup.  The hip was extremely stable throughout a functional range of motion.  COMPONENTS:  [employer] fit femur size 5 with a 36 mm + 1.5 head ball and a gription  acetabular shell size 54 with an apex hole eliminator and +4 neutral polyethylene liner.    PROCEDURE IN DETAIL:   The patient was met in the holding area and  identified.  The appropriate hip was identified and marked at the operative site.  The patient was then transported to the OR  and  placed under anesthesia.  At that point, the patient was  placed in the lateral decubitus position with the operative side up and  secured to the operating room table and all bony prominences padded.     The operative lower extremity was prepped from the iliac crest to the distal leg.  Sterile draping was performed.  Time out was performed prior to incision.      A routine posterolateral approach was utilized via sharp dissection  carried down to the subcutaneous tissue.  Gross bleeders were Bovie coagulated.  The iliotibial band was identified and incised along the length of the skin incision.  Self-retaining retractors were  inserted.  With the hip internally rotated, the short external rotators  were identified. The piriformis and capsule was tagged with FiberWire, and the hip capsule released in a T-type fashion.  The femoral neck was exposed, and I resected the femoral neck using the appropriate jig. This was performed at approximately a thumb's breadth above the lesser trochanter.    I then exposed the deep acetabulum, cleared out any tissue including the ligamentum teres.  A wing retractor was placed.  After adequate visualization, I excised the labrum, and then sequentially reamed.  I placed the trial acetabulum, which  seated nicely, and then impacted the real cup into place.  Appropriate version and inclination was confirmed clinically matching their bony anatomy, and also with the use of the jig.  A trial polyethylene liner was placed and the wing retractor removed.    I then prepared the proximal femur using the cookie-cutter, the lateralizing reamer, and then sequentially reamed and broached.  A  trial broach, neck, and head was utilized, and I reduced the hip and it was found to have excellent stability with functional range of motion. The trial components were then removed, and the real polyethylene liner was placed.  I then impacted the real femoral prosthesis into place into the appropriate version, slightly anteverted to the normal anatomy, and I impacted the real head ball into place. The hip was then reduced and taken through functional range of motion and found to have excellent stability. Leg lengths were restored.  I then used a 2 mm drill bits to pass the FiberWire suture from the capsule and piriformis through the greater trochanter, and secured this. Excellent posterior capsular repair was achieved. I also closed the T in the capsule.  I then irrigated the hip copiously again with pulse lavage, and repaired the fascia with Vicryl, followed by Vicryl for the subcutaneous tissue, Monocryl for the skin, Steri-Strips and sterile gauze. The wounds were injected. The patient was then awakened and returned to PACU in stable and satisfactory condition. There were no complications.  Marchia Bond, MD Orthopedic Surgeon [phone]   01/07/2017 5:04 PM

## 2017-01-07 NOTE — Anesthesia Procedure Notes (Signed)
Spinal  Patient location during procedure: OR Staffing Anesthesiologist: Nolon Nations Performed: anesthesiologist  Preanesthetic Checklist Completed: patient identified, site marked, surgical consent, pre-op evaluation, timeout performed, IV checked, risks and benefits discussed and monitors and equipment checked Spinal Block Patient position: sitting Prep: Betadine, site prepped and draped and DuraPrep Patient monitoring: heart rate, continuous pulse ox and blood pressure Approach: right paramedian Location: L3-4 Injection technique: single-shot Needle Needle type: Sprotte  Needle gauge: 24 G Needle length: 9 cm Additional Notes Expiration date of kit checked and confirmed. Patient tolerated procedure well, without complications.

## 2017-01-07 NOTE — Discharge Instructions (Signed)

## 2017-01-07 NOTE — H&P (Signed)
PREOPERATIVE H&P  Chief Complaint: RIGHT HIP DJD  HPI: Tina Ponce is a 51 y.o. female who presents for preoperative history and physical with a diagnosis of RIGHT HIP DJD. Symptoms are rated as moderate to severe, and have been worsening.  This is significantly impairing activities of daily living.  She has elected for surgical management.   She has failed injections, activity modification, anti-inflammatories, and assistive devices.  Preoperative X-rays demonstrate end stage degenerative changes with osteophyte formation, loss of joint space, subchondral sclerosis.   Past Medical History:  Diagnosis Date  . Anxiety   . Arthritis   . Cancer [facility])    dx 12/12/2016 with right breast cancer  . Depression   . Family history of breast cancer   . GERD (gastroesophageal reflux disease)   . Headache   . Hyperlipidemia   . Hypertension   . Left bundle branch block   . Sleep apnea    CPAP q night   . Unspecified vitamin D deficiency    Past Surgical History:  Procedure Laterality Date  . CESAREAN SECTION  11/03/1990   /w epidural   . DENTAL SURGERY     fillings and crown   Social History   Social History  . Marital status: Single    Spouse name: N/A  . Number of children: N/A  . Years of education: N/A   Social History Main Topics  . Smoking status: Former Smoker    Quit date: 03/20/2005  . Smokeless tobacco: Never Used  . Alcohol use No  . Drug use: No  . Sexual activity: Not Asked   Other Topics Concern  . None   Social History Narrative  . None   Family History  Problem Relation Age of Onset  . Hypertension Mother   . Diabetes Mother   . Cancer Father        lung; possibly prostate  . Breast cancer Sister 64       negative genetic testing @ [facility]  . Prostate cancer Maternal Grandfather 33  . Breast cancer Maternal Aunt        Dx 28s; possibly also colon cancer  . Cancer Maternal Uncle        one with lung; one with oral  . Breast cancer Sister 52        negative genetic testing @ [facility]  . Ovarian cancer Maternal Aunt        Dx 80s   Allergies  Allergen Reactions  . Avelox [Moxifloxacin Hcl In Nacl] Swelling and Other (See Comments)    ERYTHEMA  . Codeine Itching and Other (See Comments)    INTOLERANCE >  Hyperactivity and nightmares  . Latex Rash   Prior to Admission medications   Medication Sig Start Date End Date Taking? Authorizing Provider  acetaminophen (TYLENOL) 500 MG tablet Take 1,000 mg by mouth every 6 (six) hours as needed (for pain.).   Yes [provider]  ALPRAZolam Duanne Moron) 0.5 MG tablet Take 0.5 mg by mouth 3 (three) times daily as needed for sleep or anxiety.   Yes [provider]  anastrozole (ARIMIDEX) 1 MG tablet Take 1 tablet (1 mg total) by mouth daily. 12/30/16  Yes Nicholas Lose, MD  cyclobenzaprine (FLEXERIL) 10 MG tablet Take 10 mg by mouth every 8 (eight) hours as needed for muscle spasms.    Yes [provider]  meloxicam (MOBIC) 15 MG tablet Take 15 mg by mouth daily.   Yes [provider]  ranitidine (ZANTAC)  150 MG tablet Take 150 mg by mouth at bedtime as needed (for indigestion/heartburn.).    Yes [provider]  sertraline (ZOLOFT) 100 MG tablet Take 100 mg by mouth 2 (two) times daily.    Yes [provider]  SUMAtriptan (IMITREX) 100 MG tablet Take 100 mg by mouth every 2 (two) hours as needed for migraine. May repeat in 2 hours if headache persists or recurs.   Yes [provider]     Positive ROS: All other systems have been reviewed and were otherwise negative with the exception of those mentioned in the HPI and as above.  Physical Exam: General: Alert, no acute distress Cardiovascular: No pedal edema Respiratory: No cyanosis, no use of accessory musculature GI: No organomegaly, abdomen is soft and non-tender Skin: No lesions in the area of chief complaint Neurologic: Sensation intact distally Psychiatric: Patient is  competent for consent with normal mood and affect Lymphatic: No axillary or cervical lymphadenopathy  MUSCULOSKELETAL: Right hip has range of motion 0-90 degrees, no internal or external rotation and fairly significant pain with active and passive motion.  EHL intact.   Assessment: RIGHT HIP DJD   Plan: Plan for Procedure(s): TOTAL HIP ARTHROPLASTY  The risks benefits and alternatives were discussed with the patient including but not limited to the risks of nonoperative treatment, versus surgical intervention including infection, bleeding, nerve injury, periprosthetic fracture, the need for revision surgery, dislocation, leg length discrepancy, blood clots, cardiopulmonary complications, morbidity, mortality, among others, and they were willing to proceed.     Johnny Bridge, MD Cell [phone]   01/07/2017 2:10 PM

## 2017-01-08 ENCOUNTER — Encounter (HOSPITAL_COMMUNITY): Payer: Self-pay | Admitting: Orthopedic Surgery

## 2017-01-08 LAB — CBC
HCT: 34.3 % — ABNORMAL LOW (ref 36.0–46.0)
Hemoglobin: 11.3 g/dL — ABNORMAL LOW (ref 12.0–15.0)
MCH: 27.4 pg (ref 26.0–34.0)
MCHC: 32.9 g/dL (ref 30.0–36.0)
MCV: 83.3 fL (ref 78.0–100.0)
PLATELETS: 199 10*3/uL (ref 150–400)
RBC: 4.12 MIL/uL (ref 3.87–5.11)
RDW: 14.7 % (ref 11.5–15.5)
WBC: 11.3 10*3/uL — AB (ref 4.0–10.5)

## 2017-01-08 LAB — BASIC METABOLIC PANEL
ANION GAP: 10 (ref 5–15)
BUN: 14 mg/dL (ref 6–20)
CALCIUM: 8.7 mg/dL — AB (ref 8.9–10.3)
CO2: 23 mmol/L (ref 22–32)
Chloride: 100 mmol/L — ABNORMAL LOW (ref 101–111)
Creatinine, Ser: 0.73 mg/dL (ref 0.44–1.00)
GLUCOSE: 127 mg/dL — AB (ref 65–99)
POTASSIUM: 4.8 mmol/L (ref 3.5–5.1)
SODIUM: 133 mmol/L — AB (ref 135–145)

## 2017-01-08 NOTE — Progress Notes (Signed)
     Subjective:  Patient reports pain as moderate.  She was able to walk yesterday, and get out of bed and go to the bathroom.  She wants to go home today, although has not had physical therapy yet.  Objective:   VITALS:   Vitals:   01/07/17 1819 01/07/17 1835 01/07/17 2248 01/08/17 0356  BP: 111/63 112/79 110/60 123/60  Pulse: 61 72 85 76  Resp: 15 16 16 14   Temp: 98 F (36.7 C) 97.8 F (36.6 C) 97.6 F (36.4 C) 97.8 F (36.6 C)  TempSrc:  Oral Oral Oral  SpO2: 96% 100% 99% 100%  Weight:      Height:        Neurologically intact Dorsiflexion/Plantar flexion intact Incision: dressing C/D/I and no drainage   Lab Results  Component Value Date   WBC 11.3 (H) 01/08/2017   HGB 11.3 (L) 01/08/2017   HCT 34.3 (L) 01/08/2017   MCV 83.3 01/08/2017   PLT 199 01/08/2017   BMET    Component Value Date/Time   NA 133 (L) 01/08/2017 0519   NA 141 12/25/2016 1252   K 4.8 01/08/2017 0519   K 4.4 12/25/2016 1252   CL 100 (L) 01/08/2017 0519   CO2 23 01/08/2017 0519   CO2 27 12/25/2016 1252   GLUCOSE 127 (H) 01/08/2017 0519   GLUCOSE 98 12/25/2016 1252   BUN 14 01/08/2017 0519   BUN 11.9 12/25/2016 1252   CREATININE 0.73 01/08/2017 0519   CREATININE 0.8 12/25/2016 1252   CALCIUM 8.7 (L) 01/08/2017 0519   CALCIUM 9.5 12/25/2016 1252   GFRNONAA >60 01/08/2017 0519   GFRAA >60 01/08/2017 0519     Assessment/Plan: 1 Day Post-Op   Principal Problem:   Primary localized osteoarthritis of right hip   Advance diet Up with therapy Discharge home with home health if passes physical therapy and pain control.   Tina Ponce P 01/08/2017, 8:06 AM   Marchia Bond, MD Cell [phone]

## 2017-01-08 NOTE — Evaluation (Signed)
Physical Therapy Evaluation Patient Details Name: Tina Ponce MRN: [id number] DOB: 1966-01-30 Today's Date: 01/08/2017   History of Present Illness  51 yo female with OA R hip and now posterior approach THA R hip.    Clinical Impression  Pt is up to walk and go up stairs with PT after being instructed with HEP.  Her plan is to dc home now and will be seen by HHPT for followup of exercises, to ck safety with home and progress her gait and balance as tolerated.  Will continue acutely only if DC is held, but do not expect a change.    Follow Up Recommendations Home health PT;Supervision - Intermittent    Equipment Recommendations  Rolling walker with 5" wheels (if hers is not in good repair)    Recommendations for Other Services       Precautions / Restrictions Precautions Precautions: Posterior Hip Precaution Comments: Reviewed all THA precautions which pt then demonstrated for PT Restrictions Weight Bearing Restrictions: Yes RLE Weight Bearing: Weight bearing as tolerated      Mobility  Bed Mobility Overal bed mobility: Needs Assistance Bed Mobility: Supine to Sit     Supine to sit: Min guard;Min assist     General bed mobility comments: minor help to get to EOB and to support RLE briefly for transition  Transfers Overall transfer level: Modified independent Equipment used: Rolling walker (2 wheeled);1 person hand held assist             General transfer comment: used hand placement on bed and initially tried to use walker  Ambulation/Gait Ambulation/Gait assistance: Min guard (for safety) Ambulation Distance (Feet): 200 Feet (60+140) Assistive device: Rolling walker (2 wheeled);1 person hand held assist Gait Pattern/deviations: Step-through pattern;Step-to pattern;Shuffle;Decreased stride length;Decreased weight shift to right Gait velocity: reduced Gait velocity interpretation: Below normal speed for age/gender    Stairs Stairs: Yes Stairs  assistance: Min guard Stair Management: One rail Right;Step to pattern;Forwards Number of Stairs: 4 General stair comments: used single rail due to configuration of stairs  Wheelchair Mobility    Modified Rankin (Stroke Patients Only)       Balance Overall balance assessment: Needs assistance Sitting-balance support: Feet supported Sitting balance-Leahy Scale: Good     Standing balance support: Bilateral upper extremity supported;During functional activity Standing balance-Leahy Scale: Fair Standing balance comment: fair to wash hands after trip to [place]                             Pertinent Vitals/Pain Pain Assessment: Faces Faces Pain Scale: Hurts even more Pain Location: R hip Pain Descriptors / Indicators: Operative site guarding Pain Intervention(s): Monitored during session;Premedicated before session;Repositioned;Patient requesting pain meds-RN notified    Home Living Family/patient expects to be discharged to:: Private residence Living Arrangements: Children (adult children) Available Help at Discharge: Family;Available PRN/intermittently Type of Home: House Home Access: Stairs to enter Entrance Stairs-Rails: Right;Left;Can reach both Entrance Stairs-Number of Steps: 4 Home Layout: One level Home Equipment: Walker - 2 wheels;Cane - single point;Wheelchair - manual Additional Comments: was  relying on WC just before surgery due to pain level    Prior Function Level of Independence: Independent with assistive device(s)               Hand Dominance        Extremity/Trunk Assessment   Upper Extremity Assessment Upper Extremity Assessment: Overall WFL for tasks assessed    Lower Extremity Assessment Lower Extremity  Assessment: RLE deficits/detail RLE Deficits / Details: hip weakness with reduced WB on RLE during gait RLE: Unable to fully assess due to pain RLE Coordination: decreased fine motor;decreased gross motor    Cervical / Trunk  Assessment Cervical / Trunk Assessment: Normal  Communication   Communication: No difficulties  Cognition Arousal/Alertness: Awake/alert Behavior During Therapy: WFL for tasks assessed/performed Overall Cognitive Status: Within Functional Limits for tasks assessed                                        General Comments      Exercises Total Joint Exercises Ankle Circles/Pumps: AROM;Both;5 reps Quad Sets: AROM;Both;10 reps Gluteal Sets: AROM;Both;10 reps Heel Slides: AROM;Both;10 reps Hip ABduction/ADduction: AROM;Both;10 reps   Assessment/Plan    PT Assessment Patient needs continued PT services  PT Problem List Decreased strength;Decreased range of motion;Decreased activity tolerance;Decreased balance;Decreased mobility;Decreased coordination;Decreased knowledge of use of DME;Obesity;Decreased skin integrity;Pain       PT Treatment Interventions DME instruction;Stair training;Gait training;Functional mobility training;Therapeutic activities;Therapeutic exercise;Balance training;Neuromuscular re-education;Patient/family education    PT Goals (Current goals can be found in the Care Plan section)  Acute Rehab PT Goals Patient Stated Goal: to go home and get stronger, back to work PT Goal Formulation: With patient Time For Goal Achievement: 01/22/17 Potential to Achieve Goals: Good    Frequency 7X/week   Barriers to discharge Inaccessible home environment;Decreased caregiver support home alone at times with stairs to enter house    Co-evaluation               AM-PAC PT "6 Clicks" Daily Activity  Outcome Measure Difficulty turning over in bed (including adjusting bedclothes, sheets and blankets)?: A Little Difficulty moving from lying on back to sitting on the side of the bed? : A Little Difficulty sitting down on and standing up from a chair with arms (e.g., wheelchair, bedside commode, etc,.)?: A Little Help needed moving to and from a bed to chair  (including a wheelchair)?: A Little Help needed walking in hospital room?: A Little Help needed climbing 3-5 steps with a railing? : A Little 6 Click Score: 18    End of Session Equipment Utilized During Treatment: Gait belt Activity Tolerance: Patient tolerated treatment well;Patient limited by fatigue;Other (comment) (O2 sats supported during RA walking) Patient left: in chair;with call bell/phone within reach;with nursing/sitter in room Nurse Communication: Mobility status PT Visit Diagnosis: Unsteadiness on feet (R26.81);Muscle weakness (generalized) (M62.81);Difficulty in walking, not elsewhere classified (R26.2);Pain Pain - Right/Left: Right Pain - part of body: Hip    Time: 4098-1191 PT Time Calculation (min) (ACUTE ONLY): 31 min   Charges:   PT Evaluation $PT Eval Moderate Complexity: 1 Mod PT Treatments $Gait Training: 8-22 mins   PT G Codes:   PT G-Codes **NOT FOR INPATIENT CLASS** Functional Assessment Tool Used: AM-PAC 6 Clicks Basic Mobility    Ramond Dial 01/08/2017, 2:04 PM   Mee Hives, PT MS Acute Rehab Dept. Number: [place] and [place]

## 2017-01-08 NOTE — Care Management Note (Signed)
Case Management Note  Patient Details  Name: Tina Ponce MRN: [id number] Date of Birth: [birth year]  Subjective/Objective:   Pt admitted on 01/07/17 s/p total hip arthroplasty.  PTA, pt independent of ADLS.                    Action/Plan: Pt medically stable for dc home today.  She states son and sister to provide care at dc.  She has DME at home, as arranged by MD office.  [facility] to follow for HHPT, as per MD office arrangements.  Pt agreeable to [facility] and agency.    Expected Discharge Date:  01/08/17               Expected Discharge Plan:  [place]  In-House Referral:     Discharge planning Services  CM Consult  Post Acute Care Choice:    Choice offered to:  Patient  DME Arranged:    DME Agency:     HH Arranged:  PT [place]:  [facility] (now [facility])  Status of Service:  Completed, signed off  If discussed at [place] of Stay Meetings, dates discussed:    Additional Comments:  Reinaldo Raddle, RN, BSN  Trauma/Neuro ICU Case Manager [phone]

## 2017-01-08 NOTE — Discharge Summary (Signed)
Physician Discharge Summary  Patient ID: Tina Ponce MRN: [id number] DOB/AGE: [birth year] 51 y.o.  Admit date: 01/07/2017 Discharge date: 01/08/2017  Admission Diagnoses:  Primary localized osteoarthritis of right hip  Discharge Diagnoses:  Principal Problem:   Primary localized osteoarthritis of right hip   Past Medical History:  Diagnosis Date  . Anxiety   . Arthritis   . Cancer [facility])    dx 12/12/2016 with right breast cancer  . Depression   . Family history of breast cancer   . GERD (gastroesophageal reflux disease)   . Headache   . Hyperlipidemia   . Hypertension   . Left bundle branch block   . Primary localized osteoarthritis of right hip 01/07/2017  . Sleep apnea    CPAP q night   . Unspecified vitamin D deficiency     Surgeries: Procedure(s): TOTAL HIP ARTHROPLASTY on 01/07/2017   Consultants (if any):   Discharged Condition: Improved  Hospital Course: Tina Ponce is an 51 y.o. female who was admitted 01/07/2017 with a diagnosis of Primary localized osteoarthritis of right hip and went to the operating room on 01/07/2017 and underwent the above named procedures.    She was given perioperative antibiotics:  Anti-infectives    Start     Dose/Rate Route Frequency Ordered Stop   01/07/17 2000  ceFAZolin (ANCEF) IVPB 2g/100 mL premix     2 g 200 mL/hr over 30 Minutes Intravenous Every 6 hours 01/07/17 1834 01/08/17 0322   01/07/17 1330  ceFAZolin (ANCEF) IVPB 2g/100 mL premix     2 g 200 mL/hr over 30 Minutes Intravenous To ShortStay Surgical 01/06/17 1105 01/07/17 1527    .  She was given sequential compression devices, early ambulation, and Xarelto for DVT prophylaxis.  She benefited maximally from the hospital stay and there were no complications.    Recent vital signs:  Vitals:   01/07/17 2248 01/08/17 0356  BP: 110/60 123/60  Pulse: 85 76  Resp: 16 14  Temp: 97.6 F (36.4 C) 97.8 F (36.6 C)  SpO2: 99% 100%    Recent laboratory  studies:  Lab Results  Component Value Date   HGB 11.3 (L) 01/08/2017   HGB 13.0 12/25/2016   HGB 12.7 12/10/2016   Lab Results  Component Value Date   WBC 11.3 (H) 01/08/2017   PLT 199 01/08/2017   No results found for: INR Lab Results  Component Value Date   NA 133 (L) 01/08/2017   K 4.8 01/08/2017   CL 100 (L) 01/08/2017   CO2 23 01/08/2017   BUN 14 01/08/2017   CREATININE 0.73 01/08/2017   GLUCOSE 127 (H) 01/08/2017    Discharge Medications:   Allergies as of 01/08/2017      Reactions   Avelox [moxifloxacin Hcl In Nacl] Swelling, Other (See Comments)   ERYTHEMA   Codeine Itching, Other (See Comments)   INTOLERANCE >  Hyperactivity and nightmares   Latex Rash      Medication List    STOP taking these medications   meloxicam 15 MG tablet Commonly known as:  MOBIC     TAKE these medications   acetaminophen 500 MG tablet Commonly known as:  TYLENOL Take 1,000 mg by mouth every 6 (six) hours as needed (for pain.).   ALPRAZolam 0.5 MG tablet Commonly known as:  XANAX Take 0.5 mg by mouth 3 (three) times daily as needed for sleep or anxiety.   anastrozole 1 MG tablet Commonly known as:  ARIMIDEX Take 1 tablet (1  mg total) by mouth daily.   cyclobenzaprine 10 MG tablet Commonly known as:  FLEXERIL Take 1 tablet (10 mg total) by mouth every 8 (eight) hours as needed for muscle spasms.   ondansetron 4 MG tablet Commonly known as:  ZOFRAN Take 1 tablet (4 mg total) by mouth every 8 (eight) hours as needed for nausea or vomiting.   oxyCODONE 5 MG immediate release tablet Commonly known as:  ROXICODONE Take 1-2 tablets (5-10 mg total) by mouth every 4 (four) hours as needed for severe pain.   ranitidine 150 MG tablet Commonly known as:  ZANTAC Take 150 mg by mouth at bedtime as needed (for indigestion/heartburn.).   rivaroxaban 10 MG Tabs tablet Commonly known as:  XARELTO Take 1 tablet (10 mg total) by mouth daily.   sennosides-docusate sodium 8.6-50  MG tablet Commonly known as:  SENOKOT-S Take 2 tablets by mouth daily.   sertraline 100 MG tablet Commonly known as:  ZOLOFT Take 100 mg by mouth 2 (two) times daily.   SUMAtriptan 100 MG tablet Commonly known as:  IMITREX Take 100 mg by mouth every 2 (two) hours as needed for migraine. May repeat in 2 hours if headache persists or recurs.       Diagnostic Studies: Dg Hip Port Unilat With Pelvis 1v Right  Result Date: 01/07/2017 CLINICAL DATA:  Right hip arthroplasty EXAM: DG HIP (WITH OR WITHOUT PELVIS) 1V PORT RIGHT COMPARISON:  None. FINDINGS: Right hip replacement in satisfactory position. No fracture or immediate complication IMPRESSION: Satisfactory right hip replacement. Electronically Signed   By: Franchot Gallo M.D.   On: 01/07/2017 20:05    Disposition: Final discharge disposition not confirmed, plan for discharge home today if passes PT and pain control    Follow-up Information    Marchia Bond, MD. Schedule an appointment as soon as possible for a visit in 2 weeks.   Specialty:  Orthopedic Surgery Contact information: [address] [place] [address] [phone]            Signed: Johnny Bridge 01/08/2017, 8:08 AM

## 2017-01-10 ENCOUNTER — Ambulatory Visit: Payer: Self-pay | Admitting: Genetic Counselor

## 2017-01-10 ENCOUNTER — Encounter: Payer: Self-pay | Admitting: Genetic Counselor

## 2017-01-10 DIAGNOSIS — Z1379 Encounter for other screening for genetic and chromosomal anomalies: Secondary | ICD-10-CM

## 2017-01-10 NOTE — Progress Notes (Signed)
[facility]       Genetic Test Results    Patient Name: Tina Ponce Patient DOB: 1965-07-24 Patient Age: 51 y.o. Encounter Date: 01/10/2017  Referring Provider: Nicholas Lose, MD  Primary Care Provider: Shirline Frees, MD   [name] was called today to discuss genetic test results. Please see the Genetics note from her visit on 12/26/16 for a detailed discussion of her personal and family history.  Genetic Testing: At the time of [name] visit, she decided to pursue genetic testing of 9 genes that may be used to help guide treatment decisions. Once that test was completed, additional genes on a larger panel were analyzed. Testing which included sequencing and deletion/duplication analysis. Testing did not reveal any pathogenic mutation in any of these genes. A copy of the genetic test report will be scanned into Epic under the media tab.  The genes analyzed were the 23 genes on [facility]'s Breast/GYN panel (ATM, BARD1, BRCA1, BRCA2, BRIP1, CDH1, CHEK2, DICER1, EPCAM, MLH1,  MSH2, MSH6, NBN, NF1, PALB2, PMS2, PTEN, RAD50, RAD51C, RAD51D,SMARCA4, STK11, and TP53).  Since the current test is not perfect, it is possible that there may be a gene mutation that current testing cannot detect, but that chance is small. It is possible that a different genetic factor, which has not yet been discovered or is not on this panel, is responsible for the cancer diagnoses in the family. Again, the likelihood of this is low. No additional testing is recommended at this time for [name].  Cancer Screening: Given the personal and family histories, we must interpret these negative results with some caution. Families with features suggestive of hereditary risk tend to have multiple family members with cancer, diagnoses in multiple generations and diagnoses before the age of 32. [name] family exhibits some of these features. Cancer screenings will be deferred to  her providers  Family Members: Given that [name] and both of her sisters had breast cancer and all had normal testing, women in the family are recommended to speak with their own providers about when to initiate appropriate breast cancer screenings.  Any relative who had cancer at a young age or had a particularly rare cancer may also wish to pursue genetic testing. Genetic counselors can be located in other cities, by visiting the website of the [employer] ([employer]) and Field seismologist for a Dietitian by zip code.   Lastly, cancer genetics is a rapidly advancing field and it is possible that new genetic tests will be appropriate for [name] in the future. We encourage her to remain in contact with [place] on an annual basis so we can update her personal and family histories, and let her know of advances in cancer genetics that may benefit the family. Our contact number was provided. [name] is welcome to call anytime with additional questions.     Steele Berg, MS, [place] Certified Genetic Counselor phone: [phone]

## 2017-01-16 ENCOUNTER — Encounter (HOSPITAL_COMMUNITY): Payer: Self-pay

## 2017-01-16 ENCOUNTER — Encounter (HOSPITAL_COMMUNITY)
Admission: RE | Admit: 2017-01-16 | Discharge: 2017-01-16 | Disposition: A | Discharge status: Home / Self Care | Payer: PRIVATE HEALTH INSURANCE | Source: Ambulatory Visit | Attending: General Surgery | Admitting: General Surgery

## 2017-01-24 ENCOUNTER — Other Ambulatory Visit: Payer: Self-pay | Admitting: *Deleted

## 2017-01-24 DIAGNOSIS — C50211 Malignant neoplasm of upper-inner quadrant of right female breast: Secondary | ICD-10-CM

## 2017-01-24 DIAGNOSIS — Z170 Estrogen receptor positive status [ER+]: Principal | ICD-10-CM

## 2017-01-28 ENCOUNTER — Other Ambulatory Visit: Payer: Self-pay

## 2017-01-28 ENCOUNTER — Encounter: Payer: Self-pay | Admitting: Radiation Oncology

## 2017-01-28 ENCOUNTER — Ambulatory Visit: Payer: Self-pay | Admitting: General Surgery

## 2017-01-28 ENCOUNTER — Encounter (HOSPITAL_COMMUNITY): Payer: Self-pay

## 2017-01-28 ENCOUNTER — Encounter (HOSPITAL_COMMUNITY)
Admission: RE | Admit: 2017-01-28 | Discharge: 2017-01-28 | Disposition: A | Discharge status: Home / Self Care | Payer: PRIVATE HEALTH INSURANCE | Source: Ambulatory Visit | Attending: General Surgery | Admitting: General Surgery

## 2017-01-28 DIAGNOSIS — Z9889 Other specified postprocedural states: Secondary | ICD-10-CM | POA: Insufficient documentation

## 2017-01-28 DIAGNOSIS — E669 Obesity, unspecified: Secondary | ICD-10-CM | POA: Diagnosis not present

## 2017-01-28 DIAGNOSIS — Z87891 Personal history of nicotine dependence: Secondary | ICD-10-CM | POA: Diagnosis not present

## 2017-01-28 DIAGNOSIS — J45909 Unspecified asthma, uncomplicated: Secondary | ICD-10-CM | POA: Insufficient documentation

## 2017-01-28 DIAGNOSIS — M1990 Unspecified osteoarthritis, unspecified site: Secondary | ICD-10-CM | POA: Insufficient documentation

## 2017-01-28 DIAGNOSIS — F329 Major depressive disorder, single episode, unspecified: Secondary | ICD-10-CM | POA: Diagnosis not present

## 2017-01-28 DIAGNOSIS — Z6836 Body mass index (BMI) 36.0-36.9, adult: Secondary | ICD-10-CM | POA: Insufficient documentation

## 2017-01-28 DIAGNOSIS — K219 Gastro-esophageal reflux disease without esophagitis: Secondary | ICD-10-CM | POA: Diagnosis not present

## 2017-01-28 DIAGNOSIS — E785 Hyperlipidemia, unspecified: Secondary | ICD-10-CM | POA: Insufficient documentation

## 2017-01-28 DIAGNOSIS — C50211 Malignant neoplasm of upper-inner quadrant of right female breast: Secondary | ICD-10-CM

## 2017-01-28 DIAGNOSIS — F419 Anxiety disorder, unspecified: Secondary | ICD-10-CM | POA: Insufficient documentation

## 2017-01-28 DIAGNOSIS — I10 Essential (primary) hypertension: Secondary | ICD-10-CM | POA: Insufficient documentation

## 2017-01-28 DIAGNOSIS — Z01818 Encounter for other preprocedural examination: Secondary | ICD-10-CM | POA: Diagnosis not present

## 2017-01-28 DIAGNOSIS — Z7982 Long term (current) use of aspirin: Secondary | ICD-10-CM | POA: Insufficient documentation

## 2017-01-28 DIAGNOSIS — Z79899 Other long term (current) drug therapy: Secondary | ICD-10-CM | POA: Diagnosis not present

## 2017-01-28 DIAGNOSIS — Z853 Personal history of malignant neoplasm of breast: Secondary | ICD-10-CM | POA: Diagnosis not present

## 2017-01-28 DIAGNOSIS — Z170 Estrogen receptor positive status [ER+]: Principal | ICD-10-CM

## 2017-01-28 LAB — BASIC METABOLIC PANEL
ANION GAP: 11 (ref 5–15)
BUN: 10 mg/dL (ref 6–20)
CHLORIDE: 102 mmol/L (ref 101–111)
CO2: 25 mmol/L (ref 22–32)
Calcium: 9.8 mg/dL (ref 8.9–10.3)
Creatinine, Ser: 0.93 mg/dL (ref 0.44–1.00)
GFR calc non Af Amer: >60 mL/min (ref >60)
Glucose, Bld: 99 mg/dL (ref 65–99)
Potassium: 3.8 mmol/L (ref 3.5–5.1)
Sodium: 138 mmol/L (ref 135–145)

## 2017-01-28 LAB — CBC
HCT: 37.7 % (ref 36.0–46.0)
HEMOGLOBIN: 12.4 g/dL (ref 12.0–15.0)
MCH: 27.1 pg (ref 26.0–34.0)
MCHC: 32.9 g/dL (ref 30.0–36.0)
MCV: 82.5 fL (ref 78.0–100.0)
Platelets: 309 10*3/uL (ref 150–400)
RBC: 4.57 MIL/uL (ref 3.87–5.11)
RDW: 14.8 % (ref 11.5–15.5)
WBC: 7.2 10*3/uL (ref 4.0–10.5)

## 2017-01-28 NOTE — Progress Notes (Signed)
PCP: Dr. Kenton Kingfisher Cardiologist: Dr. Wynonia Lawman, last saw 10/2016 for surgical clearance for hip surgery on 01/07/17  EKG: 10/26/16. Dr. Thurman Coyer office, under media tab for 03/11/16 CXR: Denies ECHO: 05-02-14 Stress Test: 05-12-14 Cardiac Cath: Denies  Pt reports Dr. Mardelle Matte instructed her to continue taking Aspirin 325mg  2 tabs daily leading up to breast surgery for DVT prophylaxis.  Pt prescribed Xarelto post hip surgery, but reports never starting due to cost.  Pt wanted to make Dr. Marlou Starks aware, messaged Dr. Marlou Starks.  Pt instructed to bring CPAP mask with her DOS.   Patient denies shortness of breath, fever, cough, and chest pain at PAT appointment.  Patient verbalized understanding of instructions provided today at the PAT appointment.  Patient asked to review instructions at home and day of surgery.

## 2017-01-28 NOTE — Pre-Procedure Instructions (Signed)
Tina QUIZHPI  01/28/2017      [facility] [address] [place], [place] [address] Phone: [phone] Fax: [phone]    Your procedure is scheduled on Monday February 03, 2017.  Report to [facility] Admitting at 11:15 A.M.  Call this number if you have problems the morning of surgery:  [phone]   Remember:  Do not eat food or drink liquids after midnight.  Please complete your PRE-SURGERY ENSURE that was given to before you leave your house the morning of surgery.  Please, if able, drink it in one setting. DO NOT SIP.   Take these medicines the morning of surgery with A SIP OF WATER :  Sertraline (Zoloft) Acetaminophen (Tylenol)--if needed Alprazolam (Xanax)--if needed Cyclobenzaprine (Flexeril)--if needed Ranitidine (Zantac)--if needed Sumatriptan (Imitrex)--if needed  Follow your Doctor's instructions regarding your Xarelto and Aspirin.  7 days prior to surgery STOP taking any Aleve, Naproxen, Ibuprofen, Motrin, Advil, Goody's, BC's, all herbal medications, fish oil, and all vitamins   Do not wear jewelry, make-up or nail polish.  Do not wear lotions, powders, or perfumes, or deodorant.  Do not shave 48 hours prior to surgery.   Do not bring valuables to the hospital.   [facility] is not responsible for any belongings or valuables.  Contacts, dentures or bridgework may not be worn into surgery.  Leave your suitcase in the car.  After surgery it may be brought to your room.  For patients admitted to the hospital, discharge time will be determined by your treatment team.  Patients discharged the day of surgery will not be allowed to drive home.   Special instructions:   [facility]- Preparing For Surgery  Before surgery, you can play an important role. Because skin is not sterile, your skin needs to be as free of germs as possible. You can reduce the number of germs on  your skin by washing with CHG (chlorahexidine gluconate) Soap before surgery.  CHG is an antiseptic cleaner which kills germs and bonds with the skin to continue killing germs even after washing.  Please do not use if you have an allergy to CHG or antibacterial soaps. If your skin becomes reddened/irritated stop using the CHG.  Do not shave (including legs and underarms) for at least 48 hours prior to first CHG shower. It is OK to shave your face.  Please follow these instructions carefully.   1. Shower the NIGHT BEFORE SURGERY and the MORNING OF SURGERY with CHG.   2. If you chose to wash your hair, wash your hair first as usual with your normal shampoo.  3. After you shampoo, rinse your hair and body thoroughly to remove the shampoo.  4. Use CHG as you would any other liquid soap. You can apply CHG directly to the skin and wash gently with a scrungie or a clean washcloth.   5. Apply the CHG Soap to your body ONLY FROM THE NECK DOWN.  Do not use on open wounds or open sores. Avoid contact with your eyes, ears, mouth and genitals (private parts). Wash Face and genitals (private parts)  with your normal soap.  6. Wash thoroughly, paying special attention to the area where your surgery will be performed.  7. Thoroughly rinse your body with warm water from the neck down.  8. DO NOT shower/wash with your normal soap after using and rinsing off the CHG Soap.  9. Pat yourself dry  with a CLEAN TOWEL.  10. Wear CLEAN PAJAMAS to bed the night before surgery, wear comfortable clothes the morning of surgery  11. Place CLEAN SHEETS on your bed the night of your first shower and DO NOT SLEEP WITH PETS.    Day of Surgery: Do not apply any deodorants/lotions. Please wear clean clothes to the hospital/surgery center.      Please read over the following fact sheets that you were given.

## 2017-01-29 ENCOUNTER — Telehealth: Payer: Self-pay | Admitting: Hematology and Oncology

## 2017-01-29 LAB — GLUCOSE, CAPILLARY: GLUCOSE-CAPILLARY: 112 mg/dL — AB (ref 65–99)

## 2017-01-29 NOTE — Progress Notes (Signed)
Anesthesia Chart Review: Patient is a 51 year old female scheduled for right breast radioactive see localized lumpectomy and sentinel node mapping on 02/03/17 by Dr. Autumn Messing. 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  History includes former smoker (quit '07), HTN, HLD, left BBB, right breast cancer, GERD, anxiety, depression, asthma, arthritis, OSA (CPAP), dental surgery, right THA 01/07/17. BMI is consistent with obesity.  - PCP is Dr. Ernestine Conrad. - Cardiologist is Dr. Viona Gilmore. Tollie Eth. She was seen on 10/25/16 for a preoperative evaluation for right THA. He has seen her two years prior for preoperative evaluation in the setting of left BBB with negative stress test. He did not recommend any additional cardiac testing prior to right THA that was done 01/07/17. -HEM-ONC Dr. Nicholas Lose and RAD-ONC Dr. Eppie Gibson. Based on currently available notes, patient would benefit from breast conserving surgery and oncotype DX testing to determine if chemotherapy would be of any benefit. Adjuvant antiestrogen therapy and post-surgery radiation have also been discussed.  Meds include Xanax, anastrozole, ASA 325 mg, Flexeril, Zantac, Zoloft, Imitrex, tramadol. She was prescribed Xarelto following right THA, but never filled due to cost--instead was taking ASA 325 mg 2 tabs daily for DVT prophylaxis. I notified [facility] triage nurse Jan who discussed with Dr. Marlou Starks. Their office will contact patient and advise her to hold ASA for three days prior to procedure.   BP 100/60   Pulse 86   Temp 36.9 C   Resp 18   Ht 5\' 8"  (1.727 m)   Wt 239 lb 3.2 oz (108.5 kg)   SpO2 95%   BMI 36.37 kg/m   EKG 10/25/16 (Dr. Wynonia Lawman; scanned under Media tab, Correspondence 01/07/17): SR, left BBB (old).   Nuclear stress test 05/12/14 (Dr. Wynonia Lawman; scanned under Media tab, Correspondence 01/07/17): Impression: 1. Normal Lexiscan Myoview scan with no evidence of ischemia or infarction. 2.  Normal quantitative gated SPECT EF of 53% with normal wall motion and wall thickening. Recommendations: No evidence of myocardial ischemia noted. Okay to proceed with bariatric surgery from a cardiac viewpoint.[Patient never had bariatric surgery.]  Echo 05/02/14 (Dr. Wynonia Lawman; scanned under Media tab, Correspondence 01/07/17): Conclusions: - Mild concentric LVH with lower limits of normal LV systolic function. Abnormal septal motion c/w LBBB. Estimated EF 50%. - Moderate left atrial enlargement. - Mild mitral regurgitation. - Trace tricuspid and pulmonic regurgitation.  Dr. Thurman Coyer office does not have a Holter monitor result on file.   Preoperative CBC and BMET WNL.  If no acute changes then I anticipate that she can proceed as planned.  George [facility] Short Stay Center/Anesthesiology Phone [phone] 01/29/2017 10:02 AM

## 2017-01-29 NOTE — Telephone Encounter (Signed)
Scheduled appt per 11/16 sch message - patient is aware of appt date and time.

## 2017-02-03 ENCOUNTER — Ambulatory Visit (HOSPITAL_COMMUNITY): Payer: No Typology Code available for payment source | Admitting: Certified Registered Nurse Anesthetist

## 2017-02-03 ENCOUNTER — Encounter (HOSPITAL_COMMUNITY): Payer: Self-pay | Admitting: *Deleted

## 2017-02-03 ENCOUNTER — Encounter (HOSPITAL_COMMUNITY)
Admission: RE | Disposition: A | Discharge status: Home / Self Care | Payer: Self-pay | Source: Ambulatory Visit | Attending: General Surgery

## 2017-02-03 ENCOUNTER — Ambulatory Visit (HOSPITAL_COMMUNITY)
Admission: RE | Admit: 2017-02-03 | Discharge: 2017-02-03 | Disposition: A | Discharge status: Home / Self Care | Payer: No Typology Code available for payment source | Source: Ambulatory Visit | Attending: General Surgery | Admitting: General Surgery

## 2017-02-03 ENCOUNTER — Ambulatory Visit (HOSPITAL_COMMUNITY): Payer: No Typology Code available for payment source | Admitting: Vascular Surgery

## 2017-02-03 DIAGNOSIS — G43909 Migraine, unspecified, not intractable, without status migrainosus: Secondary | ICD-10-CM | POA: Insufficient documentation

## 2017-02-03 DIAGNOSIS — M1990 Unspecified osteoarthritis, unspecified site: Secondary | ICD-10-CM | POA: Diagnosis not present

## 2017-02-03 DIAGNOSIS — I10 Essential (primary) hypertension: Secondary | ICD-10-CM | POA: Diagnosis not present

## 2017-02-03 DIAGNOSIS — Z170 Estrogen receptor positive status [ER+]: Secondary | ICD-10-CM | POA: Diagnosis not present

## 2017-02-03 DIAGNOSIS — M549 Dorsalgia, unspecified: Secondary | ICD-10-CM | POA: Insufficient documentation

## 2017-02-03 DIAGNOSIS — F419 Anxiety disorder, unspecified: Secondary | ICD-10-CM | POA: Diagnosis not present

## 2017-02-03 DIAGNOSIS — Z6836 Body mass index (BMI) 36.0-36.9, adult: Secondary | ICD-10-CM | POA: Diagnosis not present

## 2017-02-03 DIAGNOSIS — K219 Gastro-esophageal reflux disease without esophagitis: Secondary | ICD-10-CM | POA: Insufficient documentation

## 2017-02-03 DIAGNOSIS — G4730 Sleep apnea, unspecified: Secondary | ICD-10-CM | POA: Insufficient documentation

## 2017-02-03 DIAGNOSIS — F329 Major depressive disorder, single episode, unspecified: Secondary | ICD-10-CM | POA: Insufficient documentation

## 2017-02-03 DIAGNOSIS — C50211 Malignant neoplasm of upper-inner quadrant of right female breast: Secondary | ICD-10-CM

## 2017-02-03 DIAGNOSIS — Z87891 Personal history of nicotine dependence: Secondary | ICD-10-CM | POA: Insufficient documentation

## 2017-02-03 HISTORY — PX: BREAST LUMPECTOMY WITH RADIOACTIVE SEED AND SENTINEL LYMPH NODE BIOPSY: SHX6550

## 2017-02-03 SURGERY — BREAST LUMPECTOMY WITH RADIOACTIVE SEED AND SENTINEL LYMPH NODE BIOPSY
Anesthesia: General | Site: Breast | Laterality: Right

## 2017-02-03 MED ORDER — 0.9 % SODIUM CHLORIDE (POUR BTL) OPTIME
TOPICAL | Status: DC | PRN
  Administered 2017-02-03: 1000 mL

## 2017-02-03 MED ORDER — LACTATED RINGERS IV SOLN
INTRAVENOUS | Status: DC
  Administered 2017-02-03 (×2): via INTRAVENOUS

## 2017-02-03 MED ORDER — HYDROMORPHONE HCL 1 MG/ML IJ SOLN
INTRAMUSCULAR | Status: AC
  Filled 2017-02-03: qty 1

## 2017-02-03 MED ORDER — PHENYLEPHRINE 40 MCG/ML (10ML) SYRINGE FOR IV PUSH (FOR BLOOD PRESSURE SUPPORT)
PREFILLED_SYRINGE | INTRAVENOUS | Status: DC | PRN
  Administered 2017-02-03: 40 ug via INTRAVENOUS
  Administered 2017-02-03: 80 ug via INTRAVENOUS
  Administered 2017-02-03: 40 ug via INTRAVENOUS
  Administered 2017-02-03: 80 ug via INTRAVENOUS
  Administered 2017-02-03: 40 ug via INTRAVENOUS

## 2017-02-03 MED ORDER — SUGAMMADEX SODIUM 200 MG/2ML IV SOLN
INTRAVENOUS | Status: AC
  Filled 2017-02-03: qty 2

## 2017-02-03 MED ORDER — CELECOXIB 200 MG PO CAPS
200.0000 mg | ORAL_CAPSULE | ORAL | Status: AC
  Administered 2017-02-03: 200 mg via ORAL
  Filled 2017-02-03: qty 1

## 2017-02-03 MED ORDER — ACETAMINOPHEN 500 MG PO TABS
1000.0000 mg | ORAL_TABLET | ORAL | Status: AC
  Administered 2017-02-03: 1000 mg via ORAL
  Filled 2017-02-03: qty 2

## 2017-02-03 MED ORDER — BUPIVACAINE-EPINEPHRINE 0.25% -1:200000 IJ SOLN
INTRAMUSCULAR | Status: DC | PRN
  Administered 2017-02-03: 28 mL

## 2017-02-03 MED ORDER — DEXAMETHASONE SODIUM PHOSPHATE 10 MG/ML IJ SOLN
INTRAMUSCULAR | Status: DC | PRN
  Administered 2017-02-03: 10 mg via INTRAVENOUS

## 2017-02-03 MED ORDER — ONDANSETRON HCL 4 MG/2ML IJ SOLN
INTRAMUSCULAR | Status: DC | PRN
  Administered 2017-02-03: 4 mg via INTRAVENOUS

## 2017-02-03 MED ORDER — PHENYLEPHRINE 40 MCG/ML (10ML) SYRINGE FOR IV PUSH (FOR BLOOD PRESSURE SUPPORT)
PREFILLED_SYRINGE | INTRAVENOUS | Status: AC
  Filled 2017-02-03: qty 20

## 2017-02-03 MED ORDER — ESMOLOL HCL 100 MG/10ML IV SOLN
INTRAVENOUS | Status: AC
  Filled 2017-02-03: qty 20

## 2017-02-03 MED ORDER — DEXAMETHASONE SODIUM PHOSPHATE 10 MG/ML IJ SOLN
INTRAMUSCULAR | Status: AC
  Filled 2017-02-03: qty 2

## 2017-02-03 MED ORDER — ROCURONIUM BROMIDE 100 MG/10ML IV SOLN
INTRAVENOUS | Status: DC | PRN
  Administered 2017-02-03: 50 mg via INTRAVENOUS

## 2017-02-03 MED ORDER — ESMOLOL HCL 100 MG/10ML IV SOLN
INTRAVENOUS | Status: DC | PRN
  Administered 2017-02-03 (×2): 20 mg via INTRAVENOUS

## 2017-02-03 MED ORDER — PROMETHAZINE HCL 25 MG/ML IJ SOLN: 6.2500 mg | INTRAMUSCULAR | Status: DC | PRN

## 2017-02-03 MED ORDER — TECHNETIUM TC 99M SULFUR COLLOID FILTERED
1.0000 | Freq: Once | INTRAVENOUS | Status: AC | PRN
  Administered 2017-02-03: 1 via INTRADERMAL

## 2017-02-03 MED ORDER — LIDOCAINE HCL (CARDIAC) 20 MG/ML IV SOLN
INTRAVENOUS | Status: DC | PRN
  Administered 2017-02-03: 80 mg via INTRAVENOUS

## 2017-02-03 MED ORDER — SUGAMMADEX SODIUM 200 MG/2ML IV SOLN
INTRAVENOUS | Status: DC | PRN
  Administered 2017-02-03: 200 mg via INTRAVENOUS

## 2017-02-03 MED ORDER — HYDROMORPHONE HCL 1 MG/ML IJ SOLN
INTRAMUSCULAR | Status: DC
Start: 2017-02-03 — End: 2017-02-03
  Filled 2017-02-03: qty 1

## 2017-02-03 MED ORDER — METHYLENE BLUE 0.5 % INJ SOLN
INTRAVENOUS | Status: AC
  Filled 2017-02-03: qty 10

## 2017-02-03 MED ORDER — MIDAZOLAM HCL 2 MG/2ML IJ SOLN
INTRAMUSCULAR | Status: AC
  Administered 2017-02-03: 2 mg
  Filled 2017-02-03: qty 2

## 2017-02-03 MED ORDER — PROPOFOL 10 MG/ML IV BOLUS
INTRAVENOUS | Status: DC | PRN
  Administered 2017-02-03: 150 mg via INTRAVENOUS
  Administered 2017-02-03: 50 mg via INTRAVENOUS

## 2017-02-03 MED ORDER — CEFAZOLIN SODIUM-DEXTROSE 2-4 GM/100ML-% IV SOLN
2.0000 g | INTRAVENOUS | Status: AC
  Administered 2017-02-03: 2 g via INTRAVENOUS
  Filled 2017-02-03: qty 100

## 2017-02-03 MED ORDER — EPHEDRINE 5 MG/ML INJ
INTRAVENOUS | Status: AC
  Filled 2017-02-03: qty 10

## 2017-02-03 MED ORDER — MIDAZOLAM HCL 2 MG/2ML IJ SOLN
INTRAMUSCULAR | Status: AC
  Filled 2017-02-03: qty 2

## 2017-02-03 MED ORDER — PROPOFOL 10 MG/ML IV BOLUS
INTRAVENOUS | Status: AC
  Filled 2017-02-03: qty 20

## 2017-02-03 MED ORDER — HYDROMORPHONE HCL 1 MG/ML IJ SOLN
0.2500 mg | INTRAMUSCULAR | Status: DC | PRN
  Administered 2017-02-03 (×3): 0.5 mg via INTRAVENOUS

## 2017-02-03 MED ORDER — MEPERIDINE HCL 25 MG/ML IJ SOLN: 6.2500 mg | INTRAMUSCULAR | Status: DC | PRN

## 2017-02-03 MED ORDER — HYDROCODONE-ACETAMINOPHEN 5-325 MG PO TABS: 1.0000 | ORAL_TABLET | Freq: Four times a day (QID) | ORAL | 0 refills | Status: DC | PRN

## 2017-02-03 MED ORDER — CHLORHEXIDINE GLUCONATE CLOTH 2 % EX PADS: 6.0000 | MEDICATED_PAD | Freq: Once | CUTANEOUS | Status: DC

## 2017-02-03 MED ORDER — OXYCODONE HCL 5 MG PO TABS
5.0000 mg | ORAL_TABLET | Freq: Once | ORAL | Status: DC | PRN
Start: 2017-02-03 — End: 2017-02-03

## 2017-02-03 MED ORDER — OXYCODONE HCL 5 MG/5ML PO SOLN: 5.0000 mg | Freq: Once | ORAL | Status: DC | PRN

## 2017-02-03 MED ORDER — ONDANSETRON HCL 4 MG/2ML IJ SOLN
INTRAMUSCULAR | Status: AC
  Filled 2017-02-03: qty 4

## 2017-02-03 MED ORDER — EPHEDRINE SULFATE-NACL 50-0.9 MG/10ML-% IV SOSY
PREFILLED_SYRINGE | INTRAVENOUS | Status: DC | PRN
  Administered 2017-02-03 (×3): 5 mg via INTRAVENOUS

## 2017-02-03 MED ORDER — BUPIVACAINE-EPINEPHRINE (PF) 0.25% -1:200000 IJ SOLN
INTRAMUSCULAR | Status: AC
  Filled 2017-02-03: qty 30

## 2017-02-03 MED ORDER — GABAPENTIN 300 MG PO CAPS
300.0000 mg | ORAL_CAPSULE | ORAL | Status: AC
  Administered 2017-02-03: 300 mg via ORAL
  Filled 2017-02-03: qty 1

## 2017-02-03 MED ORDER — FENTANYL CITRATE (PF) 100 MCG/2ML IJ SOLN
INTRAMUSCULAR | Status: DC | PRN
  Administered 2017-02-03: 25 ug via INTRAVENOUS
  Administered 2017-02-03: 50 ug via INTRAVENOUS
  Administered 2017-02-03 (×2): 25 ug via INTRAVENOUS

## 2017-02-03 MED ORDER — ROPIVACAINE HCL 5 MG/ML IJ SOLN
INTRAMUSCULAR | Status: DC | PRN
  Administered 2017-02-03: 30 mL via PERINEURAL

## 2017-02-03 MED ORDER — TRAMADOL HCL 50 MG PO TABS: 50.0000 mg | ORAL_TABLET | Freq: Four times a day (QID) | ORAL | 0 refills | Status: DC | PRN

## 2017-02-03 MED ORDER — FENTANYL CITRATE (PF) 100 MCG/2ML IJ SOLN
INTRAMUSCULAR | Status: AC
  Administered 2017-02-03: 100 ug
  Filled 2017-02-03: qty 2

## 2017-02-03 MED ORDER — FENTANYL CITRATE (PF) 250 MCG/5ML IJ SOLN
INTRAMUSCULAR | Status: AC
Start: 2017-02-03 — End: ?
  Filled 2017-02-03: qty 5

## 2017-02-03 SURGICAL SUPPLY — 44 items
ADH SKN CLS APL DERMABOND .7 (GAUZE/BANDAGES/DRESSINGS) ×1
APPLIER CLIP 9.375 MED OPEN (MISCELLANEOUS) ×2
APR CLP MED 9.3 20 MLT OPN (MISCELLANEOUS) ×1
BLADE SURG 15 STRL LF DISP TIS (BLADE) ×1 IMPLANT
BLADE SURG 15 STRL SS (BLADE) ×2
CANISTER SUCT 3000ML PPV (MISCELLANEOUS) ×2 IMPLANT
CHLORAPREP W/TINT 26ML (MISCELLANEOUS) ×2 IMPLANT
CLIP APPLIE 9.375 MED OPEN (MISCELLANEOUS) ×1 IMPLANT
CONT SPEC 4OZ CLIKSEAL STRL BL (MISCELLANEOUS) ×3 IMPLANT
COVER PROBE W GEL 5X96 (DRAPES) ×2 IMPLANT
COVER SURGICAL LIGHT HANDLE (MISCELLANEOUS) ×2 IMPLANT
DERMABOND ADVANCED (GAUZE/BANDAGES/DRESSINGS) ×1
DERMABOND ADVANCED .7 DNX12 (GAUZE/BANDAGES/DRESSINGS) ×1 IMPLANT
DRAPE CHEST BREAST 15X10 FENES (DRAPES) ×2 IMPLANT
DRAPE UTILITY XL STRL (DRAPES) ×2 IMPLANT
ELECT COATED BLADE 2.86 ST (ELECTRODE) ×2 IMPLANT
ELECT REM PT RETURN 9FT ADLT (ELECTROSURGICAL) ×2
ELECTRODE REM PT RTRN 9FT ADLT (ELECTROSURGICAL) ×1 IMPLANT
GLOVE BIO SURGEON STRL SZ7.5 (GLOVE) ×2 IMPLANT
GOWN STRL REUS W/ TWL LRG LVL3 (GOWN DISPOSABLE) ×2 IMPLANT
GOWN STRL REUS W/TWL LRG LVL3 (GOWN DISPOSABLE) ×4
KIT BASIN OR (CUSTOM PROCEDURE TRAY) ×2 IMPLANT
KIT MARKER MARGIN INK (KITS) ×2 IMPLANT
LIGHT WAVEGUIDE WIDE FLAT (MISCELLANEOUS) ×1 IMPLANT
NDL FILTER BLUNT 18X1 1/2 (NEEDLE) IMPLANT
NDL HYPO 25GX1X1/2 BEV (NEEDLE) ×1 IMPLANT
NDL SAFETY ECLIPSE 18X1.5 (NEEDLE) IMPLANT
NEEDLE FILTER BLUNT 18X 1/2SAF (NEEDLE)
NEEDLE FILTER BLUNT 18X1 1/2 (NEEDLE) IMPLANT
NEEDLE HYPO 18GX1.5 SHARP (NEEDLE)
NEEDLE HYPO 25GX1X1/2 BEV (NEEDLE) ×2 IMPLANT
NS IRRIG 1000ML POUR BTL (IV SOLUTION) ×2 IMPLANT
PACK SURGICAL SETUP 50X90 (CUSTOM PROCEDURE TRAY) ×2 IMPLANT
PENCIL BUTTON HOLSTER BLD 10FT (ELECTRODE) ×2 IMPLANT
SPONGE LAP 18X18 X RAY DECT (DISPOSABLE) ×2 IMPLANT
SUT MNCRL AB 4-0 PS2 18 (SUTURE) ×4 IMPLANT
SUT MON AB 4-0 PC3 18 (SUTURE) ×1 IMPLANT
SUT VIC AB 3-0 SH 18 (SUTURE) ×2 IMPLANT
SYR BULB 3OZ (MISCELLANEOUS) ×2 IMPLANT
SYR CONTROL 10ML LL (SYRINGE) ×2 IMPLANT
TOWEL OR 17X24 6PK STRL BLUE (TOWEL DISPOSABLE) ×2 IMPLANT
TOWEL OR 17X26 10 PK STRL BLUE (TOWEL DISPOSABLE) ×1 IMPLANT
TUBE CONNECTING 12X1/4 (SUCTIONS) ×2 IMPLANT
YANKAUER SUCT BULB TIP NO VENT (SUCTIONS) ×2 IMPLANT

## 2017-02-03 NOTE — Interval H&P Note (Signed)
History and Physical Interval Note:  02/03/2017 12:43 PM  Tina Ponce  has presented today for surgery, with the diagnosis of RIGHT BREAST CANCER  The various methods of treatment have been discussed with the patient and family. After consideration of risks, benefits and other options for treatment, the patient has consented to  Procedure(s): BREAST LUMPECTOMY WITH RADIOACTIVE SEED AND SENTINEL LYMPH NODE BIOPSY (Right) as a surgical intervention .  The patient's history has been reviewed, patient examined, no change in status, stable for surgery.  I have reviewed the patient's chart and labs.  Questions were answered to the patient's satisfaction.     TOTH III,PAUL S

## 2017-02-03 NOTE — Anesthesia Postprocedure Evaluation (Signed)
Anesthesia Post Note  Patient: Tina Ponce  Procedure(s) Performed: BREAST LUMPECTOMY WITH RADIOACTIVE SEED AND SENTINEL LYMPH NODE BIOPSY WITH ADDITIONAL TISSUE MARGINS (Right Breast)     Patient location during evaluation: PACU Anesthesia Type: General Level of consciousness: awake and alert Pain management: pain level controlled Vital Signs Assessment: post-procedure vital signs reviewed and stable Respiratory status: spontaneous breathing, nonlabored ventilation and respiratory function stable Cardiovascular status: blood pressure returned to baseline and stable Postop Assessment: no apparent nausea or vomiting Anesthetic complications: no    Last Vitals:  Vitals:   02/03/17 1659 02/03/17 1700  BP: 121/76   Pulse: 86 88  Resp: 19 20  Temp:  (!) 36.3 C  SpO2: 100% 97%    Last Pain:  Vitals:   02/03/17 1700  TempSrc:   PainSc: 3                  Lynda Rainwater

## 2017-02-03 NOTE — Anesthesia Procedure Notes (Signed)
Anesthesia Regional Block: Pectoralis block   Pre-Anesthetic Checklist: ,, timeout performed, Correct Patient, Correct Site, Correct Laterality, Correct Procedure, Correct Position, site marked, Risks and benefits discussed,  Surgical consent,  Pre-op evaluation,  At surgeon's request and post-op pain management  Laterality: Right  Prep: chloraprep       Needles:  Injection technique: Single-shot  Needle Type: Stimiplex     Needle Length: 9cm  Needle Gauge: 21     Additional Needles:   Procedures:,,,, ultrasound used (permanent image in chart),,,,  Narrative:  Start time: 02/03/2017 1:39 PM End time: 02/03/2017 1:44 PM Injection made incrementally with aspirations every 5 mL.  Performed by: Personally  Anesthesiologist: Lynda Rainwater, MD

## 2017-02-03 NOTE — Anesthesia Preprocedure Evaluation (Addendum)
Anesthesia Evaluation  Patient identified by MRN, date of birth, ID band Patient awake    Reviewed: Allergy & Precautions, NPO status , Patient's Chart, lab work & pertinent test results  Airway Mallampati: II  TM Distance: >3 FB Neck ROM: Full    Dental no notable dental hx.    Pulmonary sleep apnea , former smoker,    Pulmonary exam normal breath sounds clear to auscultation       Cardiovascular hypertension, Pt. on medications Normal cardiovascular exam+ dysrhythmias  Rhythm:Regular Rate:Normal     Neuro/Psych  Headaches, PSYCHIATRIC DISORDERS Anxiety Depression negative neurological ROS  negative psych ROS   GI/Hepatic Neg liver ROS, GERD  ,  Endo/Other  Morbid obesity  Renal/GU negative Renal ROS  negative genitourinary   Musculoskeletal negative musculoskeletal ROS (+) Arthritis , Osteoarthritis,    Abdominal (+) + obese,   Peds negative pediatric ROS (+)  Hematology negative hematology ROS (+)   Anesthesia Other Findings Breast Cancer  Reproductive/Obstetrics negative OB ROS                            Anesthesia Physical  Anesthesia Plan  ASA: III  Anesthesia Plan: General   Post-op Pain Management: GA combined w/ Regional for post-op pain   Induction:   PONV Risk Score and Plan: 3 and Ondansetron, Dexamethasone, Midazolam and Treatment may vary due to age or medical condition  Airway Management Planned: Oral ETT  Additional Equipment:   Intra-op Plan:   Post-operative Plan: Extubation in OR  Informed Consent: I have reviewed the patients History and Physical, chart, labs and discussed the procedure including the risks, benefits and alternatives for the proposed anesthesia with the patient or authorized representative who has indicated his/her understanding and acceptance.   Dental advisory given  Plan Discussed with: CRNA and Surgeon  Anesthesia Plan Comments:         Anesthesia Quick Evaluation

## 2017-02-03 NOTE — Anesthesia Procedure Notes (Signed)
Procedure Name: Intubation Date/Time: 02/03/2017 2:17 PM Performed by: Shirlyn Goltz, CRNA Pre-anesthesia Checklist: Patient identified, Emergency Drugs available, Suction available and Patient being monitored Patient Re-evaluated:Patient Re-evaluated prior to induction Oxygen Delivery Method: Circle system utilized Preoxygenation: Pre-oxygenation with 100% oxygen Induction Type: IV induction Ventilation: Mask ventilation without difficulty and Oral airway inserted - appropriate to patient size Laryngoscope Size: Mac and 3 Grade View: Grade I Tube type: Oral Tube size: 7.0 mm Number of attempts: 1 Airway Equipment and Method: Stylet Placement Confirmation: ETT inserted through vocal cords under direct vision,  positive ETCO2 and breath sounds checked- equal and bilateral Secured at: 20 cm Tube secured with: Tape Dental Injury: Teeth and Oropharynx as per pre-operative assessment

## 2017-02-03 NOTE — H&P (Signed)
Tina Ponce  Location: [facility] Patient #: [id number] DOB: [birth year] Single / Language: Cleophus Molt / Race: White Female   History of Present Illness  The patient is a 52 year old female who presents with breast cancer. we are asked to see the patient in consultation by Dr. Isidore Moos to evaluate her for a new right breast cancer. The patient is a 51 year old white female who presents with a screen detected 1.2 cm mass in the upper inner quadrant of the right breast. The axilla appeared negative. 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 She denies any breast pain or discharge from the nipple. She does not smoke. 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 Mardelle Matte   Past Surgical History  Breast Biopsy  Right. Cesarean Section - 1   Diagnostic Studies History  Colonoscopy  never Mammogram  within last year Pap Smear  1-5 years ago  Medication History  Medications Reconciled  Social History  Caffeine use  Carbonated beverages, Coffee. No alcohol use  Tobacco use  Former smoker.  Family History  Arthritis  Mother. Breast Cancer  Sister. Cerebrovascular Accident  Family Members In [place] Members In [place]  Mother, Sister. Depression  Sister. Diabetes Mellitus  Sister. Hypertension  Mother, Sister. Migraine Headache  Sister. Ovarian Cancer  Family Members In General. Respiratory Condition  Father. Thyroid problems  Sister.  Pregnancy / Birth History  Age at menarche  11 years. Age of menopause  10-50 Contraceptive History  Intrauterine device, Oral contraceptives. Gravida  1 Maternal age  1-25 Para  1  Other Problems Anxiety Disorder  Arthritis  Back Pain  Depression  Gastroesophageal Reflux Disease  Migraine Headache  Other  disease, cancer, significant illness  Sleep Apnea     Review of Systems General Not Present- Appetite Loss, Chills, Fatigue, Fever, Night Sweats, Weight Gain and Weight Loss. Skin Not Present- Change in Wart/Mole, Dryness, Hives, Jaundice, New Lesions, Non-Healing Wounds, Rash and Ulcer. HEENT Not Present- Earache, Hearing Loss, Hoarseness, Nose Bleed, Oral Ulcers, Ringing in the Ears, Seasonal Allergies, Sinus Pain, Sore Throat, Visual Disturbances, Wears glasses/contact lenses and Yellow Eyes. Respiratory Present- Snoring. Not Present- Bloody sputum, Chronic Cough, Difficulty Breathing and Wheezing. Breast Present- Breast Mass. Not Present- Breast Pain, Nipple Discharge and Skin Changes. Cardiovascular Not Present- Chest Pain, Difficulty Breathing Lying Down, Leg Cramps, Palpitations, Rapid Heart Rate, Shortness of Breath and Swelling of Extremities. Gastrointestinal Not Present- Abdominal Pain, Bloating, Bloody Stool, Change in Bowel Habits, Chronic diarrhea, Constipation, Difficulty Swallowing, Excessive gas, Gets full quickly at meals, Hemorrhoids, Indigestion, Nausea, Rectal Pain and Vomiting. Female Genitourinary Not Present- Frequency, Nocturia, Painful Urination, Pelvic Pain and Urgency. Musculoskeletal Present- Back Pain, Joint Pain and Muscle Pain. Not Present- Joint Stiffness, Muscle Weakness and Swelling of Extremities. Neurological Present- Trouble walking. Not Present- Decreased Memory, Fainting, Headaches, Numbness, Seizures, Tingling, Tremor and Weakness. Psychiatric Not Present- Anxiety, Bipolar, Change in Sleep Pattern, Depression, Fearful and Frequent crying. Endocrine Present- Hot flashes. Not Present- Cold Intolerance, Excessive Hunger, Hair Changes, Heat Intolerance and New Diabetes. Hematology Not Present- Blood Thinners, Easy Bruising, Excessive bleeding, Gland problems, HIV and Persistent Infections.   Physical Exam  General Mental Status-Alert. General  Appearance-Consistent with stated age. Hydration-Well hydrated. Voice-Normal.  Head and Neck Head-normocephalic, atraumatic with no lesions or  palpable masses. Trachea-midline. Thyroid Gland Characteristics - normal size and consistency.  Eye Eyeball - Bilateral-Extraocular movements intact. Sclera/Conjunctiva - Bilateral-No scleral icterus.  Chest and Lung Exam Chest and lung exam reveals -quiet, even and easy respiratory effort with no use of accessory muscles and on auscultation, normal breath sounds, no adventitious sounds and normal vocal resonance. Inspection Chest Wall - Normal. Back - normal.  Breast Note: there is no palpable mass in either breast. There is no palpable axillary, supraclavicular, or cervical lymphadenopathy   Cardiovascular Cardiovascular examination reveals -normal heart sounds, regular rate and rhythm with no murmurs and normal pedal pulses bilaterally.  Abdomen Inspection Inspection of the abdomen reveals - No Hernias. Skin - Scar - no surgical scars. Palpation/Percussion Palpation and Percussion of the abdomen reveal - Soft, Non Tender, No Rebound tenderness, No Rigidity (guarding) and No hepatosplenomegaly. Auscultation Auscultation of the abdomen reveals - Bowel sounds normal.  Neurologic Neurologic evaluation reveals -alert and oriented x 3 with no impairment of recent or remote memory. Mental Status-Normal.  Musculoskeletal Normal Exam - Left-Upper Extremity Strength Normal and Lower Extremity Strength Normal. Normal Exam - Right-Upper Extremity Strength Normal and Lower Extremity Strength Normal.  Lymphatic Head & Neck  General Head & Neck Lymphatics: Bilateral - Description - Normal. Axillary  General Axillary Region: Bilateral - Description - Normal. Tenderness - Non Tender. Femoral & Inguinal  Generalized Femoral & Inguinal Lymphatics: Bilateral - Description - Normal. Tenderness - Non  Tender.    Assessment & Plan MALIGNANT NEOPLASM OF UPPER-INNER QUADRANT OF RIGHT BREAST IN FEMALE, ESTROGEN RECEPTOR POSITIVE (C50.211) Impression: the patient appears to have a stage I cancer in the upper inner quadrant of the right breast. I have talked to her in detail about the options for treatment and at this point she favors breast conservation. She is also a good candidate for sentinel node mapping. She would require radioactive seed localization since the cancer is not palpable. I have discussed with her in detail the risks and benefits of the operation as well as some of the technical aspects and she understands and wishes to proceed Current Plans Pt Education - Breast cancer: discussed with patient and provided information.

## 2017-02-03 NOTE — Transfer of Care (Signed)
Immediate Anesthesia Transfer of Care Note  Patient: Tina Ponce  Procedure(s) Performed: BREAST LUMPECTOMY WITH RADIOACTIVE SEED AND SENTINEL LYMPH NODE BIOPSY WITH ADDITIONAL TISSUE MARGINS (Right Breast)  Patient Location: PACU  Anesthesia Type:GA combined with regional for post-op pain  Level of Consciousness: awake, alert , oriented and patient cooperative  Airway & Oxygen Therapy: Patient Spontanous Breathing and Patient connected to nasal cannula oxygen  Post-op Assessment: Report given to RN, Post -op Vital signs reviewed and stable and Patient moving all extremities X 4  Post vital signs: Reviewed and stable  Last Vitals:  Vitals:   02/03/17 1126 02/03/17 1600  BP: (!) 143/76   Pulse: 82   Resp: 20   Temp: 36.5 C (!) 36.4 C  SpO2: 99%     Last Pain:  Vitals:   02/03/17 1315  TempSrc:   PainSc: 7          Complications: No apparent anesthesia complications

## 2017-02-03 NOTE — Op Note (Signed)
02/03/2017  3:44 PM  PATIENT:  Tina Ponce  51 y.o. female  PRE-OPERATIVE DIAGNOSIS:  RIGHT BREAST CANCER  POST-OPERATIVE DIAGNOSIS:  RIGHT BREAST CANCER  PROCEDURE:  Procedure(s): BREAST LUMPECTOMY WITH RADIOACTIVE SEED AND DEEP RIGHT AXILLARY SENTINEL LYMPH NODE BIOPSY (Right)  SURGEON:  Surgeon(s) and Role:    * Jovita Kussmaul, MD - Primary  PHYSICIAN ASSISTANT:   ASSISTANTS: none   ANESTHESIA:   local and general  EBL:  15 mL   BLOOD ADMINISTERED:none  DRAINS: none   LOCAL MEDICATIONS USED:  MARCAINE     SPECIMEN:  Source of Specimen:  Right breast tissue with additional margins and sentinel nodes X 2  DISPOSITION OF SPECIMEN:  PATHOLOGY  COUNTS:  YES  TOURNIQUET:  * No tourniquets in log *  DICTATION: .Dragon Dictation   After informed consent was obtained the patient was brought to the operating room and placed in the supine position on the operating table.  After adequate induction of general anesthesia the patient's right chest, breast, and axillary area were prepped with ChloraPrep, allowed to dry, and draped in usual sterile manner.  An appropriate timeout was performed.  Previously an I-125 seed was placed in the upper inner right breast to mark an area of invasive breast cancer.  Earlier in the day the patient also underwent injection of 1 mCi of technetium sulfur colloid in the subareolar position on the right.  The neoprobe was initially set to technetium in an area of radioactivity was identified in the right axilla.  This area was infiltrated with quarter percent Marcaine.  A small transversely oriented incision was made with a 15 blade knife overlying the area of radioactivity.  The incision was carried through the skin and subcutaneous tissue sharply with electrocautery into the deep right axillary space was entered.  Blunt hemostat dissection was carried out under the direction of the neoprobe until I was able to identify 2 areas of increased  radioactivity.  These areas were excised sharply with the electrocautery and the lymphatics were controlled with clips.  Ex vivo counts on these 2 areas of sentinel node were approximately 100.  I suspect there are several nodes within this tissue.  No other hot or palpable lymph nodes were identified in the right axilla.  Hemostasis was achieved using the Bovie electrocautery.  The deep layer of the wound was then closed with interrupted 3-0 Vicryl stitches.  The skin was then closed with a running 4-0 Monocryl subcuticular stitch.  Attention was then turned to the right breast.  The neoprobe was set to I-125 in the area of radioactivity was readily identified.  The area around this was infiltrated with quarter percent Marcaine.  A curvilinear incision was then made along the upper inner edge of the area Ola of the right breast with a 15 blade knife.  The incision was carried through the skin and subcutaneous tissue sharply with the electrocautery.  The dissection was then carried towards the radioactive seed under the direction of the neoprobe.  Once I approached the radioactive seed I then removed a circular portion of breast tissue around the radioactive seed while checking the area of radioactivity frequently with the neoprobe.  Once the specimen was removed it was oriented with the appropriate paint colors.  A specimen radiograph was obtained that showed the clip and seed to be near the center of the specimen.  The specimen was then sent to pathology for further evaluation.  Additional anterior, posterior, medial, lateral, superior,  and inferior margins were excised sharply with the electrocautery marked appropriately and sent separately.  Hemostasis was achieved using the Bovie electrocautery.  The wound was then infiltrated with more quarter percent Marcaine and irrigated with saline.  The cavity was then marked with clips.  The deep layer of the wound was then closed with layers of interrupted 3-0 Vicryl  stitches.  The skin was then closed with interrupted 4-0 Monocryl subcuticular stitches.  Dermabond dressings were applied.  The patient tolerated the procedure well.  At the end of the case all needle sponge and instrument counts were correct.  The patient was then awakened and taken to recovery in stable condition.  PLAN OF CARE: Discharge to home after PACU  PATIENT DISPOSITION:  PACU - hemodynamically stable.   Delay start of Pharmacological VTE agent (>24hrs) due to surgical blood loss or risk of bleeding: not applicable

## 2017-02-04 ENCOUNTER — Encounter (HOSPITAL_COMMUNITY): Payer: Self-pay | Admitting: General Surgery

## 2017-02-10 ENCOUNTER — Ambulatory Visit
Admission: RE | Admit: 2017-02-10 | Discharge: 2017-02-10 | Disposition: A | Discharge status: Home / Self Care | Payer: PRIVATE HEALTH INSURANCE | Source: Ambulatory Visit | Attending: Family Medicine | Admitting: Family Medicine

## 2017-02-10 ENCOUNTER — Ambulatory Visit (HOSPITAL_BASED_OUTPATIENT_CLINIC_OR_DEPARTMENT_OTHER): Payer: PRIVATE HEALTH INSURANCE | Admitting: Hematology and Oncology

## 2017-02-10 ENCOUNTER — Other Ambulatory Visit: Payer: Self-pay | Admitting: Family Medicine

## 2017-02-10 DIAGNOSIS — R52 Pain, unspecified: Secondary | ICD-10-CM

## 2017-02-10 DIAGNOSIS — C50211 Malignant neoplasm of upper-inner quadrant of right female breast: Secondary | ICD-10-CM | POA: Diagnosis not present

## 2017-02-10 DIAGNOSIS — Z170 Estrogen receptor positive status [ER+]: Secondary | ICD-10-CM

## 2017-02-10 NOTE — Progress Notes (Signed)
Patient Care Team: Shirline Frees, MD as PCP - General (Family Medicine)  DIAGNOSIS:  Encounter Diagnosis  Name Primary?  . Malignant neoplasm of upper-inner quadrant of right breast in female, estrogen receptor positive ([place])     SUMMARY OF ONCOLOGIC HISTORY:   Malignant neoplasm of upper-inner quadrant of right breast in female, estrogen receptor positive ([place])   12/16/2016 Initial Diagnosis    Screening detected right breast mass 1.2 cm by ultrasound, axilla negative: Biopsy IDC grade 1 with DCIS ER 95% PR 95%, KI 40%,HER-2 negative ratio 1.33, T1 BN 0 stage IA clinical stage AJCC 8      02/03/2017 Surgery    Right lumpectomy: IDC grade 1, 1.2 cm, low-grade DCIS, margins negative, 0/2 lymph nodes negative, ER 95%, PR 95%, HER-2 negative ratio 1.33, Ki-67 10%, T1CN0 stage I A       CHIEF COMPLIANT: Follow-up after recent right breast surgery  INTERVAL HISTORY: Tina Ponce is a 50 year old with above-mentioned history of right breast cancer underwent lumpectomy and is here today to discuss the pathology report.  She done extremely well from recent surgery.  Prior to her breast surgery she had knee surgery and is recovering from that.  She tells me that she will be losing her job because she is unable to get back to her job at this time.  With the loss of job she will also lose her insurance this is been bothering her significantly.  REVIEW OF SYSTEMS:   Constitutional: Denies fevers, chills or abnormal weight loss Eyes: Denies blurriness of vision Ears, nose, mouth, throat, and face: Denies mucositis or sore throat Respiratory: Denies cough, dyspnea or wheezes Cardiovascular: Denies palpitation, chest discomfort Gastrointestinal:  Denies nausea, heartburn or change in bowel habits Skin: Denies abnormal skin rashes Lymphatics: Denies new lymphadenopathy or easy bruising Neurological:Denies numbness, tingling or new weaknesses Behavioral/Psych: Mood is stable, no new  changes  Extremities: No lower extremity edema Breast:  denies any pain or lumps or nodules in either breasts All other systems were reviewed with the patient and are negative.  I have reviewed the past medical history, past surgical history, social history and family history with the patient and they are unchanged from previous note.  ALLERGIES:  is allergic to avelox [moxifloxacin hcl in nacl]; codeine; and latex.  MEDICATIONS:  Current Outpatient Medications  Medication Sig Dispense Refill  . acetaminophen (TYLENOL) 650 MG CR tablet Take 1,300 mg daily by mouth.    . ALPRAZolam (XANAX) 0.5 MG tablet Take 0.5 mg by mouth 3 (three) times daily as needed for sleep or anxiety.    Marland Kitchen anastrozole (ARIMIDEX) 1 MG tablet Take 1 tablet (1 mg total) by mouth daily. (Patient taking differently: Take 1 mg at bedtime by mouth. ) 30 tablet 3  . cyclobenzaprine (FLEXERIL) 10 MG tablet Take 1 tablet (10 mg total) by mouth every 8 (eight) hours as needed for muscle spasms. 50 tablet 0  . ranitidine (ZANTAC) 150 MG tablet Take 150 mg by mouth at bedtime as needed (for indigestion/heartburn.).     Marland Kitchen sertraline (ZOLOFT) 100 MG tablet Take 100 mg by mouth 2 (two) times daily.     . SUMAtriptan (IMITREX) 100 MG tablet Take 100 mg by mouth every 2 (two) hours as needed for migraine. May repeat in 2 hours if headache persists or recurs.    . traMADol (ULTRAM) 50 MG tablet Take 50 mg every 6 (six) hours as needed by mouth.     No current facility-administered  medications for this visit.     PHYSICAL EXAMINATION: ECOG PERFORMANCE STATUS: 1 - Symptomatic but completely ambulatory  Vitals:   02/10/17 1523  BP: 123/76  Pulse: 94  Resp: 19  Temp: 97.6 F (36.4 C)  SpO2: 98%   Filed Weights   02/10/17 1523  Weight: 244 lb 8 oz (110.9 kg)    GENERAL:alert, no distress and comfortable SKIN: skin color, texture, turgor are normal, no rashes or significant lesions EYES: normal, Conjunctiva are pink and  non-injected, sclera clear OROPHARYNX:no exudate, no erythema and lips, buccal mucosa, and tongue normal  NECK: supple, thyroid normal size, non-tender, without nodularity LYMPH:  no palpable lymphadenopathy in the cervical, axillary or inguinal LUNGS: clear to auscultation and percussion with normal breathing effort HEART: regular rate & rhythm and no murmurs and no lower extremity edema ABDOMEN:abdomen soft, non-tender and normal bowel sounds MUSCULOSKELETAL:no cyanosis of digits and no clubbing  NEURO: alert & oriented x 3 with fluent speech, no focal motor/sensory deficits EXTREMITIES: No lower extremity edema   LABORATORY DATA:  I have reviewed the data as listed   Chemistry      Component Value Date/Time   NA 138 01/28/2017 1615   NA 141 12/25/2016 1252   K 3.8 01/28/2017 1615   K 4.4 12/25/2016 1252   CL 102 01/28/2017 1615   CO2 25 01/28/2017 1615   CO2 27 12/25/2016 1252   BUN 10 01/28/2017 1615   BUN 11.9 12/25/2016 1252   CREATININE 0.93 01/28/2017 1615   CREATININE 0.8 12/25/2016 1252      Component Value Date/Time   CALCIUM 9.8 01/28/2017 1615   CALCIUM 9.5 12/25/2016 1252   ALKPHOS 68 12/25/2016 1252   AST 22 12/25/2016 1252   ALT 20 12/25/2016 1252   BILITOT 0.36 12/25/2016 1252       Lab Results  Component Value Date   WBC 7.2 01/28/2017   HGB 12.4 01/28/2017   HCT 37.7 01/28/2017   MCV 82.5 01/28/2017   PLT 309 01/28/2017   NEUTROABS 4.1 12/25/2016    ASSESSMENT & PLAN:  Malignant neoplasm of upper-inner quadrant of right breast in female, estrogen receptor positive ([facility]) Right lumpectomy: IDC grade 1, 1.2 cm, low-grade DCIS, margins negative, 0/2 lymph nodes negative, ER 95%, PR 95%, HER-2 negative ratio 1.33, Ki-67 10%, T1CN0 stage I A  Pathology counseling: I discussed the final pathology report of the patient provided  a copy of this report. I discussed the margins as well as lymph node surgeries. We also discussed the final staging along  with previously performed ER/PR and HER-2/neu testing.  Recommendation: 1. Oncotype DX testing to determine if she would benefit from chemotherapy 2. Adjuvant radiation 3. Followed by adjuvant antiestrogen therapy.   Return to clinic based upon Oncotype test result   I spent 25 minutes talking to the patient of which more than half was spent in counseling and coordination of care.  No orders of the defined types were placed in this encounter.  The patient has a good understanding of the overall plan. she agrees with it. she will call with any problems that may develop before the next visit here.   Rulon Eisenmenger, MD 02/10/17

## 2017-02-10 NOTE — Assessment & Plan Note (Signed)
Right lumpectomy: IDC grade 1, 1.2 cm, low-grade DCIS, margins negative, 0/2 lymph nodes negative, ER 95%, PR 95%, HER-2 negative ratio 1.33, Ki-67 10%, T1CN0 stage I A  Pathology counseling: I discussed the final pathology report of the patient provided  a copy of this report. I discussed the margins as well as lymph node surgeries. We also discussed the final staging along with previously performed ER/PR and HER-2/neu testing.  Recommendation: 1. Oncotype DX testing to determine if she would benefit from chemotherapy 2. Adjuvant radiation 3. Followed by adjuvant antiestrogen therapy.   Return to clinic based upon Oncotype test result

## 2017-02-11 ENCOUNTER — Telehealth: Payer: Self-pay | Admitting: *Deleted

## 2017-02-11 ENCOUNTER — Telehealth: Payer: Self-pay

## 2017-02-11 NOTE — Telephone Encounter (Signed)
Ordered oncotype per Dr. Gudena.  Faxed requisition to pathology and confirmed receipt. 

## 2017-02-11 NOTE — Telephone Encounter (Signed)
No return visit or los at this time. Per 12/3 los

## 2017-02-12 NOTE — Progress Notes (Signed)
Location of Breast Cancer: Right Breast  Histology per Pathology Report:  12/16/16 Diagnosis Breast, right, needle core biopsy, 1:00 o'clock, 6cmfn INVASIVE DUCTAL CARCINOMA, GRADE 1/2 DUCTAL CARCINOMA IN SITU  Receptor Status: ER(95%), PR (95%), Her2-neu (NEG), Ki-(10%)  02/03/17 Diagnosis 1. Breast, lumpectomy, Right - INVASIVE DUCTAL CARCINOMA, GRADE I/III, SPANNING 1.2 CM. - DUCTAL CARCINOMA IN SITU, LOW GRADE. - THE SURGICAL RESECTION MARGINS ARE NEGATIVE FOR CARCINOMA. - SEE ONCOLOGY TABLE BELOW. 2. Lymph node, sentinel, biopsy, Right - THERE IS NO EVIDENCE OF CARCINOMA IN 1 OF 1 LYMPH NODE (0/1). 3. Lymph node, sentinel, biopsy, Right - THERE IS NO EVIDENCE OF CARCINOMA IN 1 OF 1 LYMPH NODE (0/1). 4. Lymph node, sentinel, biopsy, Right - BENIGN FIBROADIPOSE TISSUE. - LYMPH NODAL TISSUE IS NOT IDENTIFIED. - THERE IS NO EVIDENCE OF MALIGNANCY. 5. Breast, excision, Right anterior margin - BENIGN FIBROADIPOSE TISSUE. - THERE IS NO EVIDENCE OF MALIGNANCY. - SEE COMMENT. 6. Breast, excision, Right lateral margin - BENIGN BREAST PARENCHYMA WITH CALCIFICATIONS. - THERE IS NO EVIDENCE OF MALIGNANCY. - SEE COMMENT 7. Breast, excision, Right superior margin - BENIGN BREAST PARENCHYMA. - THERE IS NO EVIDENCE OF MALIGNANCY. - SEE COMMENT. 8. Breast, excision, Right medial margin - BENIGN BREAST PARENCHYMA. - THERE IS NO EVIDENCE OF MALIGNANCY. - SEE COMMENT. 9. Breast, excision, Right inferior margin - BENIGN BREAST PARENCHYMA. - THERE IS NO EVIDENCE OF MALIGNANCY. - SEE COMMENT. 10. Breast, excision, Right posterior margin - BENIGN BREAST PARENCHYMA. - THERE IS NO EVIDENCE OF MALIGNANCY. - SEE COMMENT  Did patient present with symptoms or was this found on screening mammography?: It was found on a screening mammogram.   Past/Anticipated interventions by surgeon, if any: 01/03/17 PROCEDURE:  Procedure(s): BREAST LUMPECTOMY WITH RADIOACTIVE SEED AND DEEP RIGHT  AXILLARY SENTINEL LYMPH NODE BIOPSY (Right) SURGEON:  Surgeon(s) and Role:    Jovita Kussmaul, MD - Primary  Past/Anticipated interventions by medical oncology, if any:  02/10/17 Dr. Lindi Adie Recommendation: 1. Oncotype DX testing to determine if she would benefit from chemotherapy 2. Adjuvant radiation 3. Followed by adjuvant antiestrogen therapy.   She started Anastrozole at the direction of Dr. Lindi Adie the end of October before her Right Hip surgery   Lymphedema issues, if any:  She denies. She can raise her right arm over her head but does have pain when she does this.   Pain issues, if any:  She has pain to her right hip after hip surgery 01/07/17 and pain to her Left hip with a future surgery planned. She has pain to her lymph node removal site to her Right Breast.   SAFETY ISSUES:  Prior radiation? No  Pacemaker/ICD? No  Possible current pregnancy? No. She is post menopausal.   Is the patient on methotrexate? No  Current Complaints / other details:     BP 134/89   Pulse 78   Temp 98.2 F (36.8 C)   Ht '5\' 8"'$  (1.727 m)   Wt 233 lb (105.7 kg)   SpO2 98% Comment: room air  BMI 35.43 kg/m    Wt Readings from Last 3 Encounters:  02/18/17 233 lb (105.7 kg)  02/10/17 244 lb 8 oz (110.9 kg)  02/03/17 240 lb (108.9 kg)   Kayleana Waites, Stephani Police, RN 02/12/2017,10:49 AM

## 2017-02-14 ENCOUNTER — Other Ambulatory Visit: Payer: Self-pay

## 2017-02-14 DIAGNOSIS — C50211 Malignant neoplasm of upper-inner quadrant of right female breast: Secondary | ICD-10-CM

## 2017-02-14 DIAGNOSIS — Z170 Estrogen receptor positive status [ER+]: Principal | ICD-10-CM

## 2017-02-14 MED ORDER — ANASTROZOLE 1 MG PO TABS: 1.0000 mg | ORAL_TABLET | Freq: Every day | ORAL | 3 refills | Status: DC

## 2017-02-18 ENCOUNTER — Ambulatory Visit
Admission: RE | Admit: 2017-02-18 | Discharge: 2017-02-18 | Disposition: A | Discharge status: Home / Self Care | Payer: PRIVATE HEALTH INSURANCE | Source: Ambulatory Visit | Attending: Radiation Oncology | Admitting: Radiation Oncology

## 2017-02-18 ENCOUNTER — Encounter: Payer: Self-pay | Admitting: Radiation Oncology

## 2017-02-18 ENCOUNTER — Encounter: Payer: Self-pay | Admitting: General Practice

## 2017-02-18 VITALS — BP 134/89 | HR 78 | Temp 98.2°F | Ht 68.0 in | Wt 233.0 lb

## 2017-02-18 DIAGNOSIS — Z170 Estrogen receptor positive status [ER+]: Secondary | ICD-10-CM | POA: Diagnosis not present

## 2017-02-18 DIAGNOSIS — Z881 Allergy status to other antibiotic agents status: Secondary | ICD-10-CM | POA: Diagnosis not present

## 2017-02-18 DIAGNOSIS — Z885 Allergy status to narcotic agent status: Secondary | ICD-10-CM | POA: Diagnosis not present

## 2017-02-18 DIAGNOSIS — Z79899 Other long term (current) drug therapy: Secondary | ICD-10-CM | POA: Diagnosis not present

## 2017-02-18 DIAGNOSIS — Z510 Encounter for antineoplastic radiation therapy: Secondary | ICD-10-CM | POA: Diagnosis not present

## 2017-02-18 DIAGNOSIS — C50211 Malignant neoplasm of upper-inner quadrant of right female breast: Secondary | ICD-10-CM | POA: Diagnosis not present

## 2017-02-18 DIAGNOSIS — Z79811 Long term (current) use of aromatase inhibitors: Secondary | ICD-10-CM | POA: Diagnosis not present

## 2017-02-18 DIAGNOSIS — Z91040 Latex allergy status: Secondary | ICD-10-CM | POA: Diagnosis not present

## 2017-02-18 NOTE — Progress Notes (Signed)
Radiation Oncology         [phone] ________________________________  Name: Tina Ponce MRN: [id number]  Date: 02/18/2017  DOB: 1965/12/04  Follow-Up Visit Note  Outpatient  CC: Shirline Frees, MD  Nicholas Lose, MD  Diagnosis:      ICD-10-CM   1. 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     Since consultation, she underwent lumpectomy and SLN bx on 11-26 with Dr Marlou Starks.  Tumor was 1.2cm IDC, nodes were negative, margins negative by at least 28m.  She is waiting for Oncotype results but states she does not intend to accept chemotherapy regardless of results.  She is stressed emotionally and physically - waiting for hip surgery (pain related to hip issues) and lost employment recently; losing health insurance. Has spoken with financial advocates. May apply for Medicaid.             ALLERGIES:  is allergic to avelox [moxifloxacin hcl in nacl]; codeine; and latex.  Meds: Current Outpatient Medications  Medication Sig Dispense Refill  . acetaminophen (TYLENOL) 650 MG CR tablet Take 1,300 mg daily by mouth.    . ALPRAZolam (XANAX) 0.5 MG tablet Take 0.5 mg by mouth 3 (three) times daily as needed for sleep or anxiety.    .Marland Kitchenanastrozole (ARIMIDEX) 1 MG tablet Take 1 tablet (1 mg total) by mouth daily. 90 tablet 3  . cyclobenzaprine (FLEXERIL) 10 MG tablet Take 1 tablet (10 mg total) by mouth every 8 (eight) hours as needed for muscle spasms. 50 tablet 0  . fluconazole (DIFLUCAN) 150 MG tablet     . hydrochlorothiazide (HYDRODIURIL) 25 MG tablet     . ranitidine (ZANTAC) 150 MG tablet Take 150 mg by mouth at bedtime as needed (for  indigestion/heartburn.).     .Marland Kitchensertraline (ZOLOFT) 100 MG tablet Take 100 mg by mouth 2 (two) times daily.     . SUMAtriptan (IMITREX) 100 MG tablet Take 100 mg by mouth every 2 (two) hours as needed for migraine. May repeat in 2 hours if headache persists or recurs.    . traMADol (ULTRAM) 50 MG tablet Take 50 mg every 6 (six) hours as needed by mouth.    . EQ STOOL SOFTENER/LAXATIVE 8.6-50 MG tablet     . HYDROcodone-acetaminophen (NORCO/VICODIN) 5-325 MG tablet     . meloxicam (MOBIC) 15 MG tablet     . ondansetron (ZOFRAN) 4 MG tablet     . oxyCODONE (OXY IR/ROXICODONE) 5 MG immediate release tablet TAKE 1 TO 2 TABLETS BY MOUTH EVERY 4 HOURS AS NEEDED FOR SEVERE PAIN  0  . promethazine-dextromethorphan (PROMETHAZINE-DM) 6.25-15 MG/5ML syrup      No current facility-administered medications for this encounter.     Physical Findings:  height is 5' 8" (1.727 m) and weight is 233 lb (105.7 kg). Her temperature is 98.2 F (36.8 C). Her blood pressure is 134/89 and her pulse is 78. Her oxygen saturation is 98%. .     General: Alert and oriented, tearful Breast exam reveals good healing of lumpectomy and axillary scar, right   Lab Findings: Lab Results  Component Value Date   WBC 7.2 01/28/2017   HGB 12.4  01/28/2017   HCT 37.7 01/28/2017   MCV 82.5 01/28/2017   PLT 309 01/28/2017      Radiographic Findings: Dg Chest 2 View  Result Date: 02/10/2017 CLINICAL DATA:  History of right-sided breast carcinoma. Bronchitis. EXAM: CHEST  2 VIEW COMPARISON:  April 28, 2014 FINDINGS: Lungs are clear. Heart size and pulmonary vascularity are normal. No adenopathy. Surgical clips are noted in the right breast region. There is mild degenerative change in the thoracic spine. There are no blastic or lytic bone lesions. IMPRESSION: No edema or consolidation. Electronically Signed   By: Lowella Grip III M.D.   On: 02/10/2017 15:47   Dg Knee 1-2 Views Left  Result Date: 02/10/2017 CLINICAL  DATA:  Ionic bilateral knee pain with no known trauma. EXAM: LEFT KNEE - 1-2 VIEW COMPARISON:  MRI of the left knee in March 17, 2007 FINDINGS: The bones are subjectively adequately mineralized. The joint spaces are well maintained. There is beaking of the tibial spines. Spurs arise from the superior and inferior articular margins of the patella. There is no acute fracture or dislocation. There is no joint effusion. IMPRESSION: Mild osteoarthritic beaking of the tibial spines and of the articular margins of the patella. There is no acute bony abnormality. Electronically Signed   By: David  Martinique M.D.   On: 02/10/2017 15:53   Dg Knee 1-2 Views Right  Result Date: 02/10/2017 CLINICAL DATA:  Chronic pain EXAM: RIGHT KNEE - 1-2 VIEW COMPARISON:  Right knee MRI March 17, 2007 FINDINGS: Standing frontal and standing lateral views were obtained. No fracture or dislocation. No joint effusion. No appreciable joint space narrowing or erosion. No intra-articular calcification. IMPRESSION: No fracture or joint effusion. No appreciable arthropathic change by radiography. Electronically Signed   By: Lowella Grip III M.D.   On: 02/10/2017 15:49   Nm Sentinel Node Inj-no Rpt (breast)  Result Date: 02/03/2017 Sulfur colloid was injected by the nuclear medicine technologist for melanoma sentinel node.   Dg Shoulder Left  Result Date: 02/10/2017 CLINICAL DATA:  Left shoulder pain for months, no known injury, initial encounter EXAM: LEFT SHOULDER - 2+ VIEW COMPARISON:  None. FINDINGS: Degenerative changes of the acromioclavicular joint are seen. No acute fracture or dislocation is noted. No soft tissue abnormality is seen. IMPRESSION: Degenerative change without acute abnormality. Electronically Signed   By: Inez Catalina M.D.   On: 02/10/2017 15:59    Impression/Plan: Right breast cancer.  I asked for Social Worker to see her today due to concerns about financial stress.  We discussed adjuvant radiotherapy  today.  I recommend radiotherapy to the right breast over 4 weeks in order to reduce risk of locoregional recurrence by 2/3.  The risks, benefits and side effects of this treatment were discussed in detail.  She understands that radiotherapy is associated with skin irritation and fatigue in the acute setting. Late effects can include cosmetic changes and rare injury to internal organs.   She is enthusiastic about proceeding with treatment. A consent form has been signed and placed in her chart.  A total of 3 medically necessary complex treatment devices will be fabricated and supervised by me: 2 fields with MLCs for custom blocks to protect heart, and lungs;  and, a Vac-lok. MORE COMPLEX DEVICES MAY BE MADE IN DOSIMETRY FOR FIELD IN FIELD BEAMS FOR DOSE HOMOGENEITY.  I have requested : 3D Simulation which is medically necessary to give adequate dose to at risk tissues while sparing lungs and heart.  I have requested  a DVH of the following structures: lungs, heart, right lumpectomy cavity.    The patient will receive 40.05 Gy in 15 fractions to the right breast with 2 fields.  This will be followed by a boost.  Of note, she denies possible pregnancy.  Has not been sexually active for >1 yr and has not menstruated since IUD removal in March.  She will tentatively undergo simulation on Dec 21st unless Oncotype result changes her mind. Med/onc informed of plan.     I spent 15 minutes   face to face with the patient and more than 50% of that time was spent in counseling and/or coordination of care. _____________________________________   Eppie Gibson, MD

## 2017-02-18 NOTE — Progress Notes (Signed)
[facility] Psychosocial Distress Screening Clinical Social Work  Clinical Social Work was referred by distress screening protocol.  The patient scored a 10 on the Psychosocial Distress Thermometer which indicates severe distress. Clinical Social Worker Edwyna Shell to assess for distress and other psychosocial needs. CSW and patient discussed common feeling and emotions when being diagnosed with cancer, and the importance of support during treatment. CSW informed patient of the support team and support services at [facility]. CSW provided contact information and encouraged patient to call with any questions or concerns.  Patient reports concerns about recent loss of job, status of long term disability, loss of insurance, concerns that she will not qualify for Medicaid.  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  Was out on FMLA for bilateral hip replacement; diagnosed w breast cancer at same time.  Has had one hip replaced; continues to have pain such that walking is difficult and uses wheelchair.  FMLA has run out for current year, lost job as result of inability to return to work successfully.  Concerned that she cannot afford COBRA costs to continue insurance, afraid she will not qualify for Medicaid.  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  Reports significant anxiety about future.  Limited social support including 27 year old son who lives in [place], and members of small church congregation.  CSW provided emotional support and validation of patient's feelings of distress.  Provided information and practice on grounding techniques to reduce anxiety.   Provided resources for [employer] including Pensions consultant.  Encouraged patient to contact [facility] financial counselors in order to explore options for financial help.  Provided information on various support programs available through the [facility], as well as  information on transportation help.  Encouraged patient to contact CSW after she has chance to review information.   ONCBCN DISTRESS SCREENING 02/18/2017  Screening Type Initial Screening  Distress experienced in past week (1-10) 10  Practical problem type Insurance;Work/school  Family Problem type   Emotional problem type Depression;Nervousness/Anxiety;Adjusting to appearance changes  Spiritual/Religous concerns type   Physical Problem type Pain;Nausea/vomiting;Getting around;Loss of appetitie;Tingling hands/feet;Skin dry/itchy  Referral to support programs     Clinical Social Worker follow up needed: No.  If yes, follow up plan:  CSW will recontact patient in one week to determine interest in various referrals provdied.

## 2017-02-19 ENCOUNTER — Encounter (HOSPITAL_COMMUNITY): Payer: Self-pay

## 2017-02-19 ENCOUNTER — Telehealth: Payer: Self-pay | Admitting: *Deleted

## 2017-02-19 NOTE — Telephone Encounter (Signed)
Received oncotype score of 13/4%. Physician team notified. Called pt with results and discussed no need for chemotherapy. Received verbal understanding.

## 2017-02-24 ENCOUNTER — Encounter: Payer: Self-pay | Admitting: Physical Therapy

## 2017-02-24 ENCOUNTER — Ambulatory Visit: Payer: PRIVATE HEALTH INSURANCE | Attending: General Surgery | Admitting: Physical Therapy

## 2017-02-24 DIAGNOSIS — R262 Difficulty in walking, not elsewhere classified: Secondary | ICD-10-CM | POA: Insufficient documentation

## 2017-02-24 DIAGNOSIS — C50211 Malignant neoplasm of upper-inner quadrant of right female breast: Secondary | ICD-10-CM | POA: Diagnosis present

## 2017-02-24 DIAGNOSIS — R293 Abnormal posture: Secondary | ICD-10-CM | POA: Diagnosis present

## 2017-02-24 DIAGNOSIS — Z170 Estrogen receptor positive status [ER+]: Secondary | ICD-10-CM | POA: Diagnosis present

## 2017-02-24 NOTE — Therapy (Signed)
[place], [place], [address] Phone: [phone]   Fax:  [phone]  Physical Therapy Evaluation  Patient Details  Name: Tina Ponce MRN: [id number] Date of Birth: [birth year] Referring Provider: Dr. Autumn Messing   Encounter Date: 02/24/2017  PT End of Session - 02/24/17 1331    Visit Number  2    Number of Visits  2    PT Start Time  2353    PT Stop Time  1338    PT Time Calculation (min)  40 min    Activity Tolerance  Patient tolerated treatment well    Behavior During Therapy  [facility] for tasks assessed/performed       Past Medical History:  Diagnosis Date  . Anxiety   . Arthritis   . Cancer [facility])    dx 12/12/2016 with right breast cancer  . Depression   . Family history of breast cancer   . Genetic testing 12/26/2016   Breast/GYN panel (23 genes) @ [facility] - No pathogenic mutations detected  . GERD (gastroesophageal reflux disease)   . Headache   . Hyperlipidemia   . Hypertension   . Left bundle branch block   . Primary localized osteoarthritis of right hip 01/07/2017  . Sleep apnea    CPAP q night   . Unspecified vitamin D deficiency     Past Surgical History:  Procedure Laterality Date  . BREAST LUMPECTOMY WITH RADIOACTIVE SEED AND SENTINEL LYMPH NODE BIOPSY Right 02/03/2017   Procedure: BREAST LUMPECTOMY WITH RADIOACTIVE SEED AND SENTINEL LYMPH NODE BIOPSY WITH ADDITIONAL TISSUE MARGINS;  Surgeon: Jovita Kussmaul, MD;  Location: [place];  Service: General;  Laterality: Right;  . CESAREAN SECTION  11/03/1990   /w epidural   . DENTAL SURGERY     fillings and crown  . TOTAL HIP ARTHROPLASTY Right 01/07/2017   Procedure: TOTAL HIP ARTHROPLASTY;  Surgeon: Marchia Bond, MD;  Location: [place];  Service: Orthopedics;  Laterality: Right;    There were no vitals filed for this visit.   Subjective Assessment - 02/24/17 1300    Subjective  Patient underwent a right lumpectomy and  sentinel node biopsy (2 nodes removed) on 02/03/17. Her Oncotype score was low and has no need for chemotherapy. She will undergo radiation and anti-estrogen therapy. She also had a right total hip replacement on 01/07/17.    Pertinent History  Patient was diagnosed on 12/11/16 with right grade 1-2 invasive ductal carcinoma breast cancer. It measures 1.2 cm and is located in the upper inner quadrant. It is ER/PR positive and HER2 negative with a Ki67 of 10%. She also had a right total hip replacement on 01/07/17.     Patient Stated Goals  Make sure arm is ok.    Currently in Pain?  No/denies         [facility] PT Assessment - 02/24/17 0001      Assessment   Medical Diagnosis  s/p right breast lumpectomy    Referring Provider  Dr. Autumn Messing    Onset Date/Surgical Date  02/03/17    Hand Dominance  Right    Prior Therapy  none      Precautions   Precautions  Other (comment)    Precaution Comments  right arm lymphedema risk; using walker s/p hip THR      Restrictions   Weight Bearing Restrictions  No      Balance Screen   Has the patient fallen in the past 6 months  No    Has the patient had a decrease in activity level because of a fear of falling?   No    Is the patient reluctant to leave their home because of a fear of falling?   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    Leisure  Unable to exercise due to recent THR      Cognition   Overall Cognitive Status  Within Functional Limits for tasks assessed      Posture/Postural Control   Posture/Postural Control  Postural limitations    Postural Limitations  Rounded  Shoulders;Forward head      ROM / Strength   AROM / PROM / Strength  AROM      AROM   AROM Assessment Site  Shoulder    Right/Left Shoulder  Right    Right Shoulder Extension  56 Degrees    Right Shoulder Flexion  136 Degrees    Right Shoulder ABduction  152 Degrees    Right Shoulder Internal Rotation  61 Degrees    Right Shoulder External Rotation  78 Degrees        LYMPHEDEMA/ONCOLOGY QUESTIONNAIRE - 02/24/17 1309      Type   Cancer Type  s/p right lumpectomy and SLNB      Surgeries   Lumpectomy Date  02/03/17    Sentinel Lymph Node Biopsy Date  02/03/17    Number Lymph Nodes Removed  2      Treatment   Active Chemotherapy Treatment  No    Past Chemotherapy Treatment  No    Active Radiation Treatment  No    Past Radiation Treatment  No    Current Hormone Treatment  Yes    Date  02/24/17    Drug Name  Arimidex    Past Hormone Therapy  No      What other symptoms do you have   Are you Having Heaviness or Tightness  No    Are you having Pain  No    Are you having pitting edema  No    Is it Hard or Difficult finding clothes that fit  No    Do you have infections  No    Is there Decreased scar mobility  Yes Around nipple incision    Stemmer Sign  No      Lymphedema Assessments   Lymphedema Assessments  Upper extremities      Right Upper Extremity Lymphedema   10 cm Proximal to Olecranon Process  36.5 cm    Olecranon Process  28.5 cm    10 cm Proximal to Ulnar Styloid Process  22.7 cm    Just Proximal to Ulnar Styloid Process  16.9 cm    Across Hand at [employer]  19.8 cm    At [place] of 2nd Digit  6.6 cm      Left Upper Extremity Lymphedema   10 cm Proximal to Olecranon Process  36.7 cm    Olecranon Process  27.3 cm    10 cm Proximal to Ulnar Styloid Process  22.5 cm    Just Proximal to Ulnar Styloid Process  16.6 cm    Across Hand at [employer]  19.3 cm    At [place] of 2nd Digit  6.4 cm        Katina Dung - 02/24/17 0001    Open a tight or new  jar  Moderate difficulty    Do heavy household chores (wash walls, wash floors)  Moderate difficulty    Carry a shopping bag or briefcase  Moderate difficulty    Wash your back  Moderate difficulty    Use a knife to cut food  No difficulty    Recreational activities in which you take some force or impact through your arm, shoulder, or hand (golf, hammering, tennis)  Mild difficulty    During the past week, to what extent has your arm, shoulder or hand problem interfered with your normal social activities with family, friends, neighbors, or groups?  Slightly    During the past week, to what extent has your arm, shoulder or hand problem limited your work or other regular daily activities  Slightly    Arm, shoulder, or hand pain.  None    Tingling (pins and needles) in your arm, shoulder, or hand  None    Difficulty Sleeping  No difficulty    DASH Score  25 %       Objective measurements completed on examination: See above findings.              PT Education - 02/24/17 1330    Education provided  Yes    Education Details  Lymphedema risk reduction and importance of ROM exercises during radiation    Person(s) Educated  Patient    Methods  Explanation    Comprehension  Verbalized understanding           Breast Clinic Goals - 12/25/16 1632      Patient will be able to verbalize understanding of pertinent lymphedema risk reduction practices relevant to her diagnosis specifically related to skin care.   Time  1    Period  Days    Status  Achieved      Patient will be able to return demonstrate and/or verbalize understanding of the post-op home exercise program related to regaining shoulder range of motion.   Time  1    Period  Days    Status  Achieved      Patient will be able to verbalize understanding of the importance of attending the postoperative After Breast Cancer Class for further lymphedema risk reduction education and therapeutic exercise.   Time  1    Period   Days    Status  Achieved            Plan - 02/24/17 1341    Clinical Impression Statement  Patient underwent a right lumpectomy and sentinel node biopsy on 02/03/17. Nodes were negative and Oncotype score was low indicating no need for chemotherapy. She will undergo radiation and is already taking anti-estrogen therapy. Her shoulder function and ROM is back to baseline and circumferential arm measurements indicate no lymphedema. She is doing very well with recovery and has no need for breast cancer related PT at this time. She will soon begin PT for her right hip replacement.    PT Treatment/Interventions  ADLs/Self Care Home Management;Therapeutic exercise;Patient/family education    PT Next Visit Plan  D/C pt; will start PT for right hip soon    PT Home Exercise Plan  Post op shoulder ROM HEP    Consulted and Agree with Plan of Care  Patient       Patient will benefit from skilled therapeutic intervention in order to improve the following deficits and impairments:  Decreased range of motion, Impaired UE functional use, Difficulty walking, Pain, Decreased knowledge of precautions, Postural dysfunction  Visit Diagnosis: Malignant neoplasm of upper-inner quadrant of right breast in female, estrogen receptor positive ([facility])  Abnormal posture  Difficulty in walking, not elsewhere classified     Problem List Patient Active Problem List   Diagnosis Date Noted  . Primary localized osteoarthritis of right hip 01/07/2017  . Genetic testing 12/26/2016  . Family history of breast cancer   . Malignant neoplasm of upper-inner quadrant of right breast in female, estrogen receptor positive ([place]) 12/19/2016   PHYSICAL THERAPY DISCHARGE SUMMARY  Visits from Start of Care: 2  Current functional level related to goals / functional outcomes: Goals met; right shoulder function and ROM is back to baseline following breast surgery.   Remaining deficits: None related to breast cancer; needs PT  s/p right THR.   Education / Equipment: Lymphedema risk reduction practices Plan: Patient agrees to discharge.  Patient goals were met. Patient is being discharged due to meeting the stated rehab goals.  ?????     Annia Friendly, [place] 02/24/17 1:45 PM  [place], [place], [address] Phone: [phone]   Fax:  [phone]  Name: Tina Ponce MRN: [id number] Date of Birth: 1965/12/25

## 2017-02-26 ENCOUNTER — Ambulatory Visit
Admission: RE | Admit: 2017-02-26 | Discharge: 2017-02-26 | Disposition: A | Discharge status: Home / Self Care | Payer: PRIVATE HEALTH INSURANCE | Source: Ambulatory Visit | Attending: Radiation Oncology | Admitting: Radiation Oncology

## 2017-02-26 ENCOUNTER — Ambulatory Visit: Payer: PRIVATE HEALTH INSURANCE | Admitting: Podiatry

## 2017-02-26 DIAGNOSIS — C50211 Malignant neoplasm of upper-inner quadrant of right female breast: Secondary | ICD-10-CM

## 2017-02-26 DIAGNOSIS — Z170 Estrogen receptor positive status [ER+]: Principal | ICD-10-CM

## 2017-02-26 DIAGNOSIS — Z510 Encounter for antineoplastic radiation therapy: Secondary | ICD-10-CM | POA: Diagnosis not present

## 2017-02-26 NOTE — Progress Notes (Signed)
  Radiation Oncology         [phone] ________________________________  Name: Tina Ponce MRN: [id number]  Date: 02/26/2017  DOB: 17-Apr-1965  SIMULATION AND TREATMENT PLANNING NOTE    Outpatient  DIAGNOSIS:     ICD-10-CM   1. Malignant neoplasm of upper-inner quadrant of right breast in female, estrogen receptor positive ([place]) C50.211    Z17.0     NARRATIVE:  The patient was brought to the [facility].  Identity was confirmed.  All relevant records and images related to the planned course of therapy were reviewed.  The patient freely provided informed written consent to proceed with treatment after reviewing the details related to the planned course of therapy. The consent form was witnessed and verified by the simulation staff.    Then, the patient was set-up in a stable reproducible supine position for radiation therapy with her ipsilateral arm over her head, and her upper body secured in a custom-made Vac-lok device.  CT images were obtained.  Surface markings were placed.  The CT images were loaded into the planning software.    TREATMENT PLANNING NOTE: Treatment planning then occurred.  The radiation prescription was entered and confirmed.     A total of 3 medically necessary complex treatment devices were fabricated and supervised by me: 2 fields with MLCs for custom blocks to protect heart, and lungs;  and, a Vac-lok. MORE COMPLEX DEVICES MAY BE MADE IN DOSIMETRY FOR FIELD IN FIELD BEAMS FOR DOSE HOMOGENEITY.  I have requested : 3D Simulation which is medically necessary to give adequate dose to at risk tissues while sparing lungs and heart.  I have requested a DVH of the following structures: lungs, heart, right lumpectomy cavity.    The patient will receive 40.05 Gy in 15 fractions to the right breast with 2 tangential fields.  . This will be followed by a boost.  Optical Surface Tracking Plan:  Since intensity modulated radiotherapy (IMRT) and 3D  conformal radiation treatment methods are predicated on accurate and precise positioning for treatment, intrafraction motion monitoring is medically necessary to ensure accurate and safe treatment delivery. The ability to quantify intrafraction motion without excessive ionizing radiation dose can only be performed with optical surface tracking. Accordingly, surface imaging offers the opportunity to obtain 3D measurements of patient position throughout IMRT and 3D treatments without excessive radiation exposure. I am ordering optical surface tracking for this patient's upcoming course of radiotherapy.  ________________________________   Reference:  Ursula Alert, J, et al. Surface imaging-based analysis of intrafraction motion for breast radiotherapy patients.Journal of [place], n. 6, nov. 2014. ISSN [id number].  Available at: <http://www.jacmp.org/index.php/jacmp/article/view/4957>.    -----------------------------------  Eppie Gibson, MD

## 2017-03-03 ENCOUNTER — Telehealth: Payer: Self-pay | Admitting: Hematology and Oncology

## 2017-03-03 NOTE — Telephone Encounter (Signed)
Called per 12/21 sch msg

## 2017-03-05 DIAGNOSIS — Z510 Encounter for antineoplastic radiation therapy: Secondary | ICD-10-CM | POA: Diagnosis not present

## 2017-03-12 ENCOUNTER — Ambulatory Visit
Admission: RE | Admit: 2017-03-12 | Discharge: 2017-03-12 | Disposition: A | Discharge status: Home / Self Care | Payer: PRIVATE HEALTH INSURANCE | Source: Ambulatory Visit | Attending: Radiation Oncology | Admitting: Radiation Oncology

## 2017-03-12 DIAGNOSIS — Z510 Encounter for antineoplastic radiation therapy: Secondary | ICD-10-CM | POA: Diagnosis not present

## 2017-03-13 ENCOUNTER — Ambulatory Visit
Admission: RE | Admit: 2017-03-13 | Discharge: 2017-03-13 | Disposition: A | Discharge status: Home / Self Care | Payer: PRIVATE HEALTH INSURANCE | Source: Ambulatory Visit | Attending: Radiation Oncology | Admitting: Radiation Oncology

## 2017-03-13 DIAGNOSIS — Z510 Encounter for antineoplastic radiation therapy: Secondary | ICD-10-CM | POA: Diagnosis not present

## 2017-03-14 ENCOUNTER — Ambulatory Visit
Admission: RE | Admit: 2017-03-14 | Discharge: 2017-03-14 | Disposition: A | Discharge status: Home / Self Care | Payer: PRIVATE HEALTH INSURANCE | Source: Ambulatory Visit | Attending: Radiation Oncology | Admitting: Radiation Oncology

## 2017-03-14 DIAGNOSIS — Z510 Encounter for antineoplastic radiation therapy: Secondary | ICD-10-CM | POA: Diagnosis not present

## 2017-03-17 ENCOUNTER — Ambulatory Visit
Admission: RE | Admit: 2017-03-17 | Discharge: 2017-03-17 | Disposition: A | Discharge status: Home / Self Care | Payer: PRIVATE HEALTH INSURANCE | Source: Ambulatory Visit | Attending: Radiation Oncology | Admitting: Radiation Oncology

## 2017-03-17 DIAGNOSIS — Z510 Encounter for antineoplastic radiation therapy: Secondary | ICD-10-CM | POA: Diagnosis not present

## 2017-03-17 DIAGNOSIS — Z170 Estrogen receptor positive status [ER+]: Principal | ICD-10-CM

## 2017-03-17 DIAGNOSIS — C50211 Malignant neoplasm of upper-inner quadrant of right female breast: Secondary | ICD-10-CM

## 2017-03-17 MED ORDER — ALRA NON-METALLIC DEODORANT (RAD-ONC)
1.0000 "application " | Freq: Once | TOPICAL | Status: AC
  Administered 2017-03-17: 1 via TOPICAL

## 2017-03-17 MED ORDER — RADIAPLEXRX EX GEL
Freq: Once | CUTANEOUS | Status: AC
  Administered 2017-03-17: 13:00:00 via TOPICAL

## 2017-03-17 NOTE — Progress Notes (Signed)

## 2017-03-18 ENCOUNTER — Ambulatory Visit
Admission: RE | Admit: 2017-03-18 | Discharge: 2017-03-18 | Disposition: A | Discharge status: Home / Self Care | Payer: PRIVATE HEALTH INSURANCE | Source: Ambulatory Visit | Attending: Radiation Oncology | Admitting: Radiation Oncology

## 2017-03-18 DIAGNOSIS — Z510 Encounter for antineoplastic radiation therapy: Secondary | ICD-10-CM | POA: Diagnosis not present

## 2017-03-19 ENCOUNTER — Ambulatory Visit
Admission: RE | Admit: 2017-03-19 | Discharge: 2017-03-19 | Disposition: A | Discharge status: Home / Self Care | Payer: PRIVATE HEALTH INSURANCE | Source: Ambulatory Visit | Attending: Radiation Oncology | Admitting: Radiation Oncology

## 2017-03-19 DIAGNOSIS — Z510 Encounter for antineoplastic radiation therapy: Secondary | ICD-10-CM | POA: Diagnosis not present

## 2017-03-20 ENCOUNTER — Ambulatory Visit
Admission: RE | Admit: 2017-03-20 | Discharge: 2017-03-20 | Disposition: A | Discharge status: Home / Self Care | Payer: PRIVATE HEALTH INSURANCE | Source: Ambulatory Visit | Attending: Radiation Oncology | Admitting: Radiation Oncology

## 2017-03-20 DIAGNOSIS — Z510 Encounter for antineoplastic radiation therapy: Secondary | ICD-10-CM | POA: Diagnosis not present

## 2017-03-21 ENCOUNTER — Ambulatory Visit
Admission: RE | Admit: 2017-03-21 | Discharge: 2017-03-21 | Disposition: A | Discharge status: Home / Self Care | Payer: PRIVATE HEALTH INSURANCE | Source: Ambulatory Visit | Attending: Radiation Oncology | Admitting: Radiation Oncology

## 2017-03-21 DIAGNOSIS — Z510 Encounter for antineoplastic radiation therapy: Secondary | ICD-10-CM | POA: Diagnosis not present

## 2017-03-24 ENCOUNTER — Ambulatory Visit
Admission: RE | Admit: 2017-03-24 | Discharge: 2017-03-24 | Disposition: A | Discharge status: Home / Self Care | Payer: PRIVATE HEALTH INSURANCE | Source: Ambulatory Visit | Attending: Radiation Oncology | Admitting: Radiation Oncology

## 2017-03-24 DIAGNOSIS — Z510 Encounter for antineoplastic radiation therapy: Secondary | ICD-10-CM | POA: Diagnosis not present

## 2017-03-25 ENCOUNTER — Ambulatory Visit
Admission: RE | Admit: 2017-03-25 | Discharge: 2017-03-25 | Disposition: A | Discharge status: Home / Self Care | Payer: No Typology Code available for payment source | Source: Ambulatory Visit | Attending: Radiation Oncology | Admitting: Radiation Oncology

## 2017-03-25 DIAGNOSIS — Z888 Allergy status to other drugs, medicaments and biological substances status: Secondary | ICD-10-CM | POA: Diagnosis not present

## 2017-03-25 DIAGNOSIS — Z510 Encounter for antineoplastic radiation therapy: Secondary | ICD-10-CM | POA: Insufficient documentation

## 2017-03-25 DIAGNOSIS — Z791 Long term (current) use of non-steroidal anti-inflammatories (NSAID): Secondary | ICD-10-CM | POA: Insufficient documentation

## 2017-03-25 DIAGNOSIS — Z79891 Long term (current) use of opiate analgesic: Secondary | ICD-10-CM | POA: Diagnosis not present

## 2017-03-25 DIAGNOSIS — C50211 Malignant neoplasm of upper-inner quadrant of right female breast: Secondary | ICD-10-CM | POA: Diagnosis not present

## 2017-03-25 DIAGNOSIS — Z170 Estrogen receptor positive status [ER+]: Secondary | ICD-10-CM | POA: Diagnosis not present

## 2017-03-25 DIAGNOSIS — Z79899 Other long term (current) drug therapy: Secondary | ICD-10-CM | POA: Diagnosis not present

## 2017-03-25 DIAGNOSIS — Z885 Allergy status to narcotic agent status: Secondary | ICD-10-CM | POA: Diagnosis not present

## 2017-03-25 DIAGNOSIS — Z91040 Latex allergy status: Secondary | ICD-10-CM | POA: Insufficient documentation

## 2017-03-26 ENCOUNTER — Ambulatory Visit
Admission: RE | Admit: 2017-03-26 | Discharge: 2017-03-26 | Disposition: A | Discharge status: Home / Self Care | Payer: PRIVATE HEALTH INSURANCE | Source: Ambulatory Visit | Attending: Radiation Oncology | Admitting: Radiation Oncology

## 2017-03-26 DIAGNOSIS — Z510 Encounter for antineoplastic radiation therapy: Secondary | ICD-10-CM | POA: Diagnosis not present

## 2017-03-27 ENCOUNTER — Ambulatory Visit
Admission: RE | Admit: 2017-03-27 | Discharge: 2017-03-27 | Disposition: A | Discharge status: Home / Self Care | Payer: No Typology Code available for payment source | Source: Ambulatory Visit | Attending: Radiation Oncology | Admitting: Radiation Oncology

## 2017-03-27 DIAGNOSIS — Z510 Encounter for antineoplastic radiation therapy: Secondary | ICD-10-CM | POA: Diagnosis not present

## 2017-03-28 ENCOUNTER — Ambulatory Visit
Admission: RE | Admit: 2017-03-28 | Discharge: 2017-03-28 | Disposition: A | Discharge status: Home / Self Care | Payer: PRIVATE HEALTH INSURANCE | Source: Ambulatory Visit | Attending: Radiation Oncology | Admitting: Radiation Oncology

## 2017-03-28 ENCOUNTER — Ambulatory Visit: Admission: RE | Admit: 2017-03-28 | Payer: PRIVATE HEALTH INSURANCE | Source: Ambulatory Visit

## 2017-03-28 DIAGNOSIS — Z510 Encounter for antineoplastic radiation therapy: Secondary | ICD-10-CM | POA: Diagnosis not present

## 2017-03-31 ENCOUNTER — Ambulatory Visit
Admission: RE | Admit: 2017-03-31 | Discharge: 2017-03-31 | Disposition: A | Discharge status: Home / Self Care | Payer: No Typology Code available for payment source | Source: Ambulatory Visit | Attending: Radiation Oncology | Admitting: Radiation Oncology

## 2017-03-31 DIAGNOSIS — Z510 Encounter for antineoplastic radiation therapy: Secondary | ICD-10-CM | POA: Diagnosis not present

## 2017-04-01 ENCOUNTER — Ambulatory Visit
Admission: RE | Admit: 2017-04-01 | Discharge: 2017-04-01 | Disposition: A | Discharge status: Home / Self Care | Payer: PRIVATE HEALTH INSURANCE | Source: Ambulatory Visit | Attending: Radiation Oncology | Admitting: Radiation Oncology

## 2017-04-01 DIAGNOSIS — Z510 Encounter for antineoplastic radiation therapy: Secondary | ICD-10-CM | POA: Diagnosis not present

## 2017-04-02 ENCOUNTER — Ambulatory Visit
Admission: RE | Admit: 2017-04-02 | Discharge: 2017-04-02 | Disposition: A | Discharge status: Home / Self Care | Payer: PRIVATE HEALTH INSURANCE | Source: Ambulatory Visit | Attending: Radiation Oncology | Admitting: Radiation Oncology

## 2017-04-02 DIAGNOSIS — Z510 Encounter for antineoplastic radiation therapy: Secondary | ICD-10-CM | POA: Diagnosis not present

## 2017-04-03 ENCOUNTER — Ambulatory Visit
Admission: RE | Admit: 2017-04-03 | Discharge: 2017-04-03 | Disposition: A | Discharge status: Home / Self Care | Payer: PRIVATE HEALTH INSURANCE | Source: Ambulatory Visit | Attending: Radiation Oncology | Admitting: Radiation Oncology

## 2017-04-03 DIAGNOSIS — Z510 Encounter for antineoplastic radiation therapy: Secondary | ICD-10-CM | POA: Diagnosis not present

## 2017-04-04 ENCOUNTER — Ambulatory Visit
Admission: RE | Admit: 2017-04-04 | Discharge: 2017-04-04 | Disposition: A | Discharge status: Home / Self Care | Payer: No Typology Code available for payment source | Source: Ambulatory Visit | Attending: Radiation Oncology | Admitting: Radiation Oncology

## 2017-04-04 DIAGNOSIS — Z510 Encounter for antineoplastic radiation therapy: Secondary | ICD-10-CM | POA: Diagnosis not present

## 2017-04-06 NOTE — Assessment & Plan Note (Signed)
Right lumpectomy: IDC grade 1, 1.2 cm, low-grade DCIS, margins negative, 0/2 lymph nodes negative, ER 95%, PR 95%, HER-2 negative ratio 1.33, Ki-67 10%, T1CN0 stage I A  Oncotype Dx 13: ROR: 6% Adj XRT: 03/16/17-04/07/17 Treatment Plan: Adj anti estrogen therapy with

## 2017-04-07 ENCOUNTER — Ambulatory Visit
Admission: RE | Admit: 2017-04-07 | Discharge: 2017-04-07 | Disposition: A | Discharge status: Home / Self Care | Payer: No Typology Code available for payment source | Source: Ambulatory Visit | Attending: Radiation Oncology | Admitting: Radiation Oncology

## 2017-04-07 ENCOUNTER — Inpatient Hospital Stay: Payer: PRIVATE HEALTH INSURANCE | Attending: Hematology and Oncology | Admitting: Hematology and Oncology

## 2017-04-07 ENCOUNTER — Telehealth: Payer: Self-pay | Admitting: Hematology and Oncology

## 2017-04-07 DIAGNOSIS — Z923 Personal history of irradiation: Secondary | ICD-10-CM

## 2017-04-07 DIAGNOSIS — Z510 Encounter for antineoplastic radiation therapy: Secondary | ICD-10-CM | POA: Diagnosis not present

## 2017-04-07 DIAGNOSIS — Z170 Estrogen receptor positive status [ER+]: Secondary | ICD-10-CM

## 2017-04-07 DIAGNOSIS — C50211 Malignant neoplasm of upper-inner quadrant of right female breast: Secondary | ICD-10-CM | POA: Insufficient documentation

## 2017-04-07 MED ORDER — LETROZOLE 2.5 MG PO TABS: 2.5000 mg | ORAL_TABLET | Freq: Every day | ORAL | 3 refills | Status: DC

## 2017-04-07 NOTE — Progress Notes (Signed)
Patient Care Team: Shirline Frees, MD as PCP - General (Family Medicine)  DIAGNOSIS:  Encounter Diagnosis  Name Primary?  . Malignant neoplasm of upper-inner quadrant of right breast in female, estrogen receptor positive ([place])     SUMMARY OF ONCOLOGIC HISTORY:   Malignant neoplasm of upper-inner quadrant of right breast in female, estrogen receptor positive ([place])   12/16/2016 Initial Diagnosis    Screening detected right breast mass 1.2 cm by ultrasound, axilla negative: Biopsy IDC grade 1 with DCIS ER 95% PR 95%, KI 40%,HER-2 negative ratio 1.33, T1 BN 0 stage IA clinical stage AJCC 8      02/03/2017 Surgery    Right lumpectomy: IDC grade 1, 1.2 cm, low-grade DCIS, margins negative, 0/2 lymph nodes negative, ER 95%, PR 95%, HER-2 negative ratio 1.33, Ki-67 10%, T1CN0 stage I A      02/04/2017 Oncotype testing    Oncotype Dx 13: ROR 6%      03/13/2017 - 04/06/2017 Radiation Therapy    Adj XRT       CHIEF COMPLIANT: Follow-up after radiation is complete  INTERVAL HISTORY: Tina Ponce is a 52 year old with above-mentioned history of right breast cancer treated with lumpectomy and finished with adjuvant radiation therapy.  She is here today to discuss adjuvant treatment options.  She reports to me severe pain underneath the breast related to recent radiation therapy.  She has not stopped her antiestrogen therapy throughout radiation.  She is currently taking anastrozole 1 mg daily.  She does says that the tumor in the breast has shrunk significantly in size.  REVIEW OF SYSTEMS:   Constitutional: Denies fevers, chills or abnormal weight loss Eyes: Denies blurriness of vision Ears, nose, mouth, throat, and face: Denies mucositis or sore throat Respiratory: Denies cough, dyspnea or wheezes Cardiovascular: Denies palpitation, chest discomfort Gastrointestinal:  Denies nausea, heartburn or change in bowel habits Skin: Denies abnormal skin rashes Lymphatics: Denies new  lymphadenopathy or easy bruising Neurological:Denies numbness, tingling or new weaknesses Behavioral/Psych: Mood is stable, no new changes  Extremities: No lower extremity edema Breast: Pain related to radiation therapy All other systems were reviewed with the patient and are negative.  I have reviewed the past medical history, past surgical history, social history and family history with the patient and they are unchanged from previous note.  ALLERGIES:  is allergic to avelox [moxifloxacin hcl in nacl]; codeine; and latex.  MEDICATIONS:  Current Outpatient Medications  Medication Sig Dispense Refill  . acetaminophen (TYLENOL) 650 MG CR tablet Take 1,300 mg daily by mouth.    . ALPRAZolam (XANAX) 0.5 MG tablet Take 0.5 mg by mouth 3 (three) times daily as needed for sleep or anxiety.    Marland Kitchen anastrozole (ARIMIDEX) 1 MG tablet Take 1 tablet (1 mg total) by mouth daily. 90 tablet 3  . cyclobenzaprine (FLEXERIL) 10 MG tablet Take 1 tablet (10 mg total) by mouth every 8 (eight) hours as needed for muscle spasms. 50 tablet 0  . EQ STOOL SOFTENER/LAXATIVE 8.6-50 MG tablet     . fluconazole (DIFLUCAN) 150 MG tablet     . hydrochlorothiazide (HYDRODIURIL) 25 MG tablet     . HYDROcodone-acetaminophen (NORCO/VICODIN) 5-325 MG tablet     . meloxicam (MOBIC) 15 MG tablet     . ondansetron (ZOFRAN) 4 MG tablet     . oxyCODONE (OXY IR/ROXICODONE) 5 MG immediate release tablet TAKE 1 TO 2 TABLETS BY MOUTH EVERY 4 HOURS AS NEEDED FOR SEVERE PAIN  0  . promethazine-dextromethorphan (PROMETHAZINE-DM)  6.25-15 MG/5ML syrup     . ranitidine (ZANTAC) 150 MG tablet Take 150 mg by mouth at bedtime as needed (for indigestion/heartburn.).     Marland Kitchen sertraline (ZOLOFT) 100 MG tablet Take 100 mg by mouth 2 (two) times daily.     . SUMAtriptan (IMITREX) 100 MG tablet Take 100 mg by mouth every 2 (two) hours as needed for migraine. May repeat in 2 hours if headache persists or recurs.    . traMADol (ULTRAM) 50 MG tablet  Take 50 mg every 6 (six) hours as needed by mouth.     No current facility-administered medications for this visit.     PHYSICAL EXAMINATION: ECOG PERFORMANCE STATUS: 1 - Symptomatic but completely ambulatory  Vitals:   04/07/17 1515  BP: (!) 121/93  Pulse: 81  Resp: 19  Temp: 98.4 F (36.9 C)  SpO2: 100%   Filed Weights   04/07/17 1515  Weight: 236 lb 8 oz (107.3 kg)    GENERAL:alert, no distress and comfortable SKIN: skin color, texture, turgor are normal, no rashes or significant lesions EYES: normal, Conjunctiva are pink and non-injected, sclera clear OROPHARYNX:no exudate, no erythema and lips, buccal mucosa, and tongue normal  NECK: supple, thyroid normal size, non-tender, without nodularity LYMPH:  no palpable lymphadenopathy in the cervical, axillary or inguinal LUNGS: clear to auscultation and percussion with normal breathing effort HEART: regular rate & rhythm and no murmurs and no lower extremity edema ABDOMEN:abdomen soft, non-tender and normal bowel sounds MUSCULOSKELETAL:no cyanosis of digits and no clubbing  NEURO: alert & oriented x 3 with fluent speech, no focal motor/sensory deficits EXTREMITIES: No lower extremity edema Breast: Radiation dermatitis  LABORATORY DATA:  I have reviewed the data as listed CMP Latest Ref Rng & Units 01/28/2017 01/08/2017 12/25/2016  Glucose 65 - 99 mg/dL 99 127(H) 98  BUN 6 - 20 mg/dL 10 14 11.9  Creatinine 0.44 - 1.00 mg/dL 0.93 0.73 0.8  Sodium 135 - 145 mmol/L 138 133(L) 141  Potassium 3.5 - 5.1 mmol/L 3.8 4.8 4.4  Chloride 101 - 111 mmol/L 102 100(L) -  CO2 22 - 32 mmol/L _0 Calcium 8.9 - 10.3 mg/dL 9.8 8.7(L) 9.5  Total Protein 6.4 - 8.3 g/dL - - 7.3  Total Bilirubin 0.20 - 1.20 mg/dL - - 0.36  Alkaline Phos 40 - 150 U/L - - 68  AST 5 - 34 U/L - - 22  ALT 0 - 55 U/L - - 20    Lab Results  Component Value Date   WBC 7.2 01/28/2017   HGB 12.4 01/28/2017   HCT 37.7 01/28/2017   MCV 82.5 01/28/2017    PLT 309 01/28/2017   NEUTROABS 4.1 12/25/2016    ASSESSMENT & PLAN:  Malignant neoplasm of upper-inner quadrant of right breast in female, estrogen receptor positive ([facility]) Right lumpectomy: IDC grade 1, 1.2 cm, low-grade DCIS, margins negative, 0/2 lymph nodes negative, ER 95%, PR 95%, HER-2 negative ratio 1.33, Ki-67 10%, T1CN0 stage I A  Oncotype Dx 13: ROR: 6% Adj XRT: 03/16/17-04/07/17 Treatment Plan: Patient was taking anastrozole and we switched her to letrozole therapy. She continued to take anastrozole throughout radiation. I instructed her to stop antiestrogen therapy at the intake a 3-week break before reinitiating therapy with letrozole. Return to clinic in 3 months for survivorship care plan visit  I spent 25 minutes talking to the patient of which more than half was spent in counseling and coordination of care.  No orders of the defined types  were placed in this encounter.  The patient has a good understanding of the overall plan. she agrees with it. she will call with any problems that may develop before the next visit here.   Harriette Ohara, MD 04/07/17

## 2017-04-07 NOTE — Telephone Encounter (Signed)
Gave patient avs and calendar with appts per 1/28 sch msg

## 2017-04-08 ENCOUNTER — Ambulatory Visit
Admission: RE | Admit: 2017-04-08 | Discharge: 2017-04-08 | Disposition: A | Discharge status: Home / Self Care | Payer: No Typology Code available for payment source | Source: Ambulatory Visit | Attending: Radiation Oncology | Admitting: Radiation Oncology

## 2017-04-08 ENCOUNTER — Other Ambulatory Visit: Payer: Self-pay | Admitting: Radiation Oncology

## 2017-04-08 DIAGNOSIS — Z510 Encounter for antineoplastic radiation therapy: Secondary | ICD-10-CM | POA: Diagnosis not present

## 2017-04-09 ENCOUNTER — Ambulatory Visit
Admission: RE | Admit: 2017-04-09 | Discharge: 2017-04-09 | Disposition: A | Discharge status: Home / Self Care | Payer: No Typology Code available for payment source | Source: Ambulatory Visit | Attending: Radiation Oncology | Admitting: Radiation Oncology

## 2017-04-09 DIAGNOSIS — Z510 Encounter for antineoplastic radiation therapy: Secondary | ICD-10-CM | POA: Diagnosis not present

## 2017-04-10 ENCOUNTER — Encounter: Payer: Self-pay | Admitting: Hematology and Oncology

## 2017-04-10 NOTE — Progress Notes (Signed)
Progress notes requested from Oncotype, 2nd request sent 04/10/17, sent office note from 04/07/17 to fax [phone], confirmation received 04/10/17

## 2017-04-11 ENCOUNTER — Encounter: Payer: Self-pay | Admitting: Radiation Oncology

## 2017-04-11 NOTE — Progress Notes (Signed)
  Radiation Oncology         [phone] ________________________________  Name: Tina Ponce MRN: [id number]  Date: 04/11/2017  DOB: Oct 05, 1965  End of Treatment Note  Diagnosis:   pT1c pN0 cM0 Stage I Grade I Right breast invasive ductal carcinoma, ER/PR +, Her2 neg  Indication for treatment:  Curative       Radiation treatment dates:   03/13/17-04/09/17  Site/dose:   1. Right breast, 2.67 Gy x 15 fractions for a total of 40.05 Gy           2. Right breast boost, 2 Gy x 5 fractions for a total of 10 Gy  Beams/energy:   1. 3D, 10X, 6X         2. Electron, 15E  Narrative: The patient tolerated radiation treatment relatively well. At the beginning of treatment, pt reported mild fatigue without pain or skin issues. On PE, she had mild erythema to her right breast. Towards the end of treatment, pt reported peeling at IM fold. On PE, she had superficial moist desquamation to right IM fold with erythema of right breast. Pt continued to use silvadene cream throughout her remaining treatments. Overall the pt was without complaints.    Plan: The patient has completed radiation treatment. The patient will return to radiation oncology clinic for routine followup in one month. I advised them to call or return sooner if they have any questions or concerns related to their recovery or treatment.  -----------------------------------  Eppie Gibson, MD  This document serves as a record of services personally performed by Eppie Gibson, MD. It was created on her behalf by Steva Colder, a trained medical scribe. The creation of this record is based on the scribe's personal observations and the provider's statements to them. This document has been checked and approved by the attending provider.

## 2017-05-09 ENCOUNTER — Ambulatory Visit
Admission: RE | Admit: 2017-05-09 | Discharge: 2017-05-09 | Disposition: A | Discharge status: Home / Self Care | Payer: PRIVATE HEALTH INSURANCE | Source: Ambulatory Visit | Attending: Radiation Oncology | Admitting: Radiation Oncology

## 2017-05-09 NOTE — Progress Notes (Incomplete)
Radiation Oncology         [phone] ________________________________  Name: Tina Ponce MRN: [id number]  Date: 05/09/2017  DOB: [birth year]  Follow-Up Visit Note  Outpatient  CC: Shirline Frees, MD  Nicholas Lose, MD  Diagnosis and Prior Radiotherapy: No diagnosis found.   pT1cpN0cM0 Stage I Grade I Right breastinvasive ductal carcinoma, ER/PR +, Her2 neg  Previous Radiation:  Radiation treatment dates:   03/13/17-04/09/17 Site/dose:   1. Right breast, 2.67 Gy in 15 fractions for a total of 40.05 Gy                      2. Right breast boost, 2 Gy in 5 fractions for a total of 10 Gy  CHIEF COMPLAINT: Here for follow-up and surveillance of right breast cancer  Narrative:  The patient returns today for routine follow-up.  ***                              ALLERGIES:  is allergic to avelox [moxifloxacin hcl in nacl]; codeine; and latex.  Meds: Current Outpatient Medications  Medication Sig Dispense Refill   acetaminophen (TYLENOL) 650 MG CR tablet Take 1,300 mg daily by mouth.     ALPRAZolam (XANAX) 0.5 MG tablet Take 0.5 mg by mouth 3 (three) times daily as needed for sleep or anxiety.     cyclobenzaprine (FLEXERIL) 10 MG tablet Take 1 tablet (10 mg total) by mouth every 8 (eight) hours as needed for muscle spasms. 50 tablet 0   EQ STOOL SOFTENER/LAXATIVE 8.6-50 MG tablet      hydrochlorothiazide (HYDRODIURIL) 25 MG tablet      letrozole (FEMARA) 2.5 MG tablet Take 1 tablet (2.5 mg total) by mouth daily. 90 tablet 3   meloxicam (MOBIC) 15 MG tablet      ranitidine (ZANTAC) 150 MG tablet Take 150 mg by mouth at bedtime as needed (for indigestion/heartburn.).      sertraline (ZOLOFT) 100 MG tablet Take 100 mg by mouth 2 (two) times daily.      SUMAtriptan (IMITREX) 100 MG tablet Take 100 mg by mouth every 2 (two) hours as needed for migraine. May repeat in 2 hours if headache persists or recurs.     traMADol (ULTRAM) 50 MG tablet Take 50 mg every 6 (six) hours  as needed by mouth.     No current facility-administered medications for this encounter.     Physical Findings: The patient is in no acute distress. Patient is alert and oriented.  vitals were not taken for this visit. .    Satisfactory skin healing in radiotherapy fields. ***   Lab Findings: Lab Results  Component Value Date   WBC 7.2 01/28/2017   HGB 12.4 01/28/2017   HCT 37.7 01/28/2017   MCV 82.5 01/28/2017   PLT 309 01/28/2017    Radiographic Findings: No results found.  Impression/Plan: Healing well from radiotherapy to the breast tissue.  Continue skin care with topical Vitamin E Oil and / or lotion for at least 2 more months for further healing.  I encouraged her to continue with yearly mammography as appropriate (for intact breast tissue) and followup with medical oncology. I will see her back on an as-needed basis. I have encouraged her to call if she has any issues or concerns in the future. I wished her the very best.   I spent {CHL ONC TIME VISIT - NOIBB:[id number]} face to face with the  patient and more than 50% of that time was spent in counseling and/or coordination of care. _____________________________________   Eppie Gibson, MD   This document serves as a record of services personally performed by Eppie Gibson, MD. It was created on her behalf by Arlyce Harman, a trained medical scribe. The creation of this record is based on the scribe's personal observations and the provider's statements to them. This document has been checked and approved by the attending provider.

## 2017-07-09 ENCOUNTER — Telehealth: Payer: Self-pay

## 2017-07-09 NOTE — Telephone Encounter (Signed)
Unable to LVM at this time for reminder of SCP visit 07/14/17 d/t vm is full.

## 2017-07-14 ENCOUNTER — Inpatient Hospital Stay: Payer: PRIVATE HEALTH INSURANCE | Attending: Hematology and Oncology | Admitting: Adult Health

## 2017-07-15 ENCOUNTER — Telehealth: Payer: Self-pay | Admitting: Medical Oncology

## 2017-07-15 ENCOUNTER — Telehealth: Payer: Self-pay

## 2017-07-15 NOTE — Telephone Encounter (Signed)
I will give a patient a call and discuss letrozole side effects and what she can do. Thanks

## 2017-07-15 NOTE — Telephone Encounter (Signed)
Letrozole -'I can't take this medicine . Main side effect is Headaches .  It causes breast changes, I have cellulitis ( Dr Marlou Starks) and my mood changes from anger to crying". She noticed these symptoms in late feb ,but just now has put it together that it is the Letrozole. Please advise

## 2017-07-15 NOTE — Telephone Encounter (Signed)
Attempted to call pt to address letrozole concerns, per Triage call. Pt phone mailbox is full and unable to leave vm at this time.

## 2017-07-15 NOTE — Telephone Encounter (Signed)
Patient could stop for 3 weeks and restart at 1/2 tab daily

## 2017-07-16 ENCOUNTER — Encounter: Payer: Self-pay | Admitting: Radiation Oncology

## 2017-07-16 NOTE — Progress Notes (Signed)
error 

## 2017-07-17 ENCOUNTER — Telehealth: Payer: Self-pay

## 2017-07-17 ENCOUNTER — Telehealth: Payer: Self-pay | Admitting: Adult Health

## 2017-07-17 NOTE — Telephone Encounter (Signed)
Returned pt's call regarding she wants to cancel Dr Pearlie Oyster appt tomorrow- noted that has already been cancelled. Reminded her of upcoming appointment on 5/28 at 10 am with Mendel Ryder NP. She said she will think about appt and if decides to cancel, she'll let us know.   Then pt reports she stopped the Letrozole due to "tremedous" headaches and she can't take that medicine again and has already tried anastrozole in the past.  She verbalized may be open to trying something else and to see what Dr Lindi Adie thinks. I let her know Dr Lindi Adie is not here right now but I will message him.

## 2017-07-17 NOTE — Telephone Encounter (Signed)
Tried to call regarding 5/28 patients voicemail was full

## 2017-07-18 ENCOUNTER — Ambulatory Visit
Admission: RE | Admit: 2017-07-18 | Discharge: 2017-07-18 | Disposition: A | Discharge status: Home / Self Care | Payer: PRIVATE HEALTH INSURANCE | Source: Ambulatory Visit | Attending: Radiation Oncology | Admitting: Radiation Oncology

## 2017-07-18 HISTORY — DX: Personal history of irradiation: Z92.3

## 2017-07-22 ENCOUNTER — Other Ambulatory Visit: Payer: Self-pay | Admitting: Orthopedic Surgery

## 2017-07-24 NOTE — Pre-Procedure Instructions (Signed)
   [name]  07/24/2017    [facility] [address], [place] [address] Phone: [phone] Fax: [phone]   Your procedure is scheduled on Tuesday, Aug 05, 2017  Report to [facility] Admitting at 11:30 A.M.  Call this number if you have problems the morning of surgery:  [phone]   Remember; Follow your surgeon's instructions regarding Aspirin  Do not eat food or drink liquids after midnight Monday, Aug 04, 2017  Take these medicines the morning of surgery with A SIP OF WATER :ALPRAZolam Duanne Moron), baclofen (LIORESAL), ranitidine (ZANTAC), sertraline (ZOLOFT), if needed: SUMAtriptan (IMITREX) for headache, traMADol (ULTRAM) for pain Stop taking vitamins, fish oil and herbal medications. Do not take any NSAIDs ie: Ibuprofen, Advil, Naproxen (Aleve), Meloxicam (Mobic), BC and Goody Powder; stop Tuesday, Jul 29, 2017  Do not wear jewelry, make-up or nail polish.  Do not wear lotions, powders, or perfumes, or deodorant.  Do not shave 48 hours prior to surgery.    Do not bring valuables to the hospital.  [facility] is not responsible for any belongings or valuables.  Contacts, dentures or bridgework may not be worn into surgery.  Leave your suitcase in the car.  After surgery it may be brought to your room. Special instructions: Shower the night before surgery and the morning of surgery with CHG. Please read over the following fact sheets that you were given. Pain Booklet, Coughing and Deep Breathing, MRSA Information and Surgical Site Infection Prevention

## 2017-07-25 ENCOUNTER — Encounter (HOSPITAL_COMMUNITY): Payer: Self-pay

## 2017-07-25 ENCOUNTER — Encounter (HOSPITAL_COMMUNITY)
Admission: RE | Admit: 2017-07-25 | Discharge: 2017-07-25 | Disposition: A | Discharge status: Home / Self Care | Payer: PRIVATE HEALTH INSURANCE | Source: Ambulatory Visit | Attending: Orthopedic Surgery | Admitting: Orthopedic Surgery

## 2017-07-25 DIAGNOSIS — Z96641 Presence of right artificial hip joint: Secondary | ICD-10-CM | POA: Diagnosis not present

## 2017-07-25 DIAGNOSIS — Z923 Personal history of irradiation: Secondary | ICD-10-CM | POA: Diagnosis not present

## 2017-07-25 DIAGNOSIS — Z87891 Personal history of nicotine dependence: Secondary | ICD-10-CM | POA: Diagnosis not present

## 2017-07-25 DIAGNOSIS — I10 Essential (primary) hypertension: Secondary | ICD-10-CM | POA: Insufficient documentation

## 2017-07-25 DIAGNOSIS — Z79899 Other long term (current) drug therapy: Secondary | ICD-10-CM | POA: Diagnosis not present

## 2017-07-25 DIAGNOSIS — Z79811 Long term (current) use of aromatase inhibitors: Secondary | ICD-10-CM | POA: Diagnosis not present

## 2017-07-25 DIAGNOSIS — M1612 Unilateral primary osteoarthritis, left hip: Secondary | ICD-10-CM | POA: Diagnosis not present

## 2017-07-25 DIAGNOSIS — Z791 Long term (current) use of non-steroidal anti-inflammatories (NSAID): Secondary | ICD-10-CM | POA: Diagnosis not present

## 2017-07-25 DIAGNOSIS — Z01812 Encounter for preprocedural laboratory examination: Secondary | ICD-10-CM | POA: Insufficient documentation

## 2017-07-25 DIAGNOSIS — G4733 Obstructive sleep apnea (adult) (pediatric): Secondary | ICD-10-CM | POA: Diagnosis not present

## 2017-07-25 DIAGNOSIS — I447 Left bundle-branch block, unspecified: Secondary | ICD-10-CM | POA: Insufficient documentation

## 2017-07-25 DIAGNOSIS — K219 Gastro-esophageal reflux disease without esophagitis: Secondary | ICD-10-CM | POA: Diagnosis not present

## 2017-07-25 DIAGNOSIS — E785 Hyperlipidemia, unspecified: Secondary | ICD-10-CM | POA: Insufficient documentation

## 2017-07-25 DIAGNOSIS — C50911 Malignant neoplasm of unspecified site of right female breast: Secondary | ICD-10-CM | POA: Insufficient documentation

## 2017-07-25 DIAGNOSIS — F329 Major depressive disorder, single episode, unspecified: Secondary | ICD-10-CM | POA: Diagnosis not present

## 2017-07-25 DIAGNOSIS — J45909 Unspecified asthma, uncomplicated: Secondary | ICD-10-CM | POA: Insufficient documentation

## 2017-07-25 DIAGNOSIS — F419 Anxiety disorder, unspecified: Secondary | ICD-10-CM | POA: Diagnosis not present

## 2017-07-25 DIAGNOSIS — E559 Vitamin D deficiency, unspecified: Secondary | ICD-10-CM | POA: Insufficient documentation

## 2017-07-25 LAB — SURGICAL PCR SCREEN
MRSA, PCR: NEGATIVE
Staphylococcus aureus: NEGATIVE

## 2017-07-25 LAB — BASIC METABOLIC PANEL
ANION GAP: 9 (ref 5–15)
BUN: 19 mg/dL (ref 6–20)
CHLORIDE: 103 mmol/L (ref 101–111)
CO2: 25 mmol/L (ref 22–32)
Calcium: 9.6 mg/dL (ref 8.9–10.3)
Creatinine, Ser: 0.9 mg/dL (ref 0.44–1.00)
GFR calc Af Amer: >60 mL/min (ref >60)
GFR calc non Af Amer: >60 mL/min (ref >60)
GLUCOSE: 105 mg/dL — AB (ref 65–99)
POTASSIUM: 4.1 mmol/L (ref 3.5–5.1)
Sodium: 137 mmol/L (ref 135–145)

## 2017-07-25 LAB — CBC
HEMATOCRIT: 40.2 % (ref 36.0–46.0)
HEMOGLOBIN: 13 g/dL (ref 12.0–15.0)
MCH: 26.2 pg (ref 26.0–34.0)
MCHC: 32.3 g/dL (ref 30.0–36.0)
MCV: 80.9 fL (ref 78.0–100.0)
Platelets: 226 10*3/uL (ref 150–400)
RBC: 4.97 MIL/uL (ref 3.87–5.11)
RDW: 15.2 % (ref 11.5–15.5)
WBC: 8.4 10*3/uL (ref 4.0–10.5)

## 2017-07-25 NOTE — Progress Notes (Signed)
Right hip 01-07-2017  Cleared by Dr. Wynonia Lawman  Pt. States that she saw Dr. Wynonia Lawman on 07-23-2017  EKG done at that time.  Requested EKG and  OV from Dr. Wynonia Lawman

## 2017-07-28 ENCOUNTER — Encounter (HOSPITAL_COMMUNITY): Payer: Self-pay | Admitting: Emergency Medicine

## 2017-07-28 NOTE — Progress Notes (Signed)
Anesthesia Chart Review:  Case:  [id number] Date/Time:  08/05/17 1315   Procedure:  LEFT TOTAL HIP ARTHROPLASTY (Left Hip)   Anesthesia type:  Choice   Pre-op diagnosis:  OA LEFT HIP   Location:  [facility] ROOM 04 / [facility]   Surgeon:  Marchia Bond, MD      DISCUSSION: Patient is a 52 year old female scheduled for the above procedure.   History includes former smoker (quit '07), HTN, HLD, left BBB, right breast cancer (invasive ductal carcinoma, stage 1; s/p right lumpectomy 02/03/17, s/p radiation 03/13/17-04/09/17, GERD, asthma, OSA (CPAP), right THA 01/07/17. BMI is consistent with obesity.  She has cardiology clearance from Dr. Wynonia Lawman. If no acute changes then I would anticipate that she can proceed as planned.   VS: BP (!) 142/77   Pulse 75   Temp 36.7 C   Resp 20   Ht 5\' 8"  (1.727 m)   Wt 235 lb 4.8 oz (106.7 kg)   SpO2 95%   BMI 35.78 kg/m   PROVIDERS: - PCP is Dr. Ernestine Conrad. - Cardiologist is Dr. Viona Gilmore. Tollie Eth. She was seen 07/23/17 for a preoperative evaluation He has seen her two years prior for preoperative evaluation in the setting of left BBB with negative stress test. He did not recommend any additional cardiac testing prior to left THA. PRN cardiology follow-up recommended.  -HEM-ONC is Dr. Nicholas Lose and RAD-ONC is Dr. Eppie Gibson.    LABS: Labs reviewed: Acceptable for surgery. (all labs ordered are listed, but only abnormal results are displayed)  Labs Reviewed  BASIC METABOLIC PANEL - Abnormal; Notable for the following components:      Result Value   Glucose, Bld 105 (*)    All other components within normal limits  SURGICAL PCR SCREEN  CBC     IMAGES: CXR 02/10/17:  IMPRESSION: No edema or consolidation.   EKG: EKG 07/23/17 (Dr. Wynonia Lawman): SR, left BBB (old).   CV: Nuclear stress test 05/12/14 (Dr. Wynonia Lawman; scanned under Media tab, Correspondence 01/07/17): Impression: 1. Normal Lexiscan Myoview scan with no evidence of ischemia or  infarction. 2. Normal quantitative gated SPECT EF of 53% with normal wall motion and wall thickening. Recommendations: No evidence of myocardial ischemia noted. Okay to proceed with bariatric surgery from a cardiac viewpoint.[Patient never had bariatric surgery.]  Echo 05/02/14 (Dr. Wynonia Lawman; scanned under Media tab, Correspondence 01/07/17): Conclusions: - Mild concentric LVH with lower limits of normal LV systolic function. Abnormal septal motion c/w LBBB. Estimated EF 50%. - Moderate left atrial enlargement. - Mild mitral regurgitation. - Trace tricuspid and pulmonic regurgitation.  Previously I was unable to get a copy of her 2016 Holter monitor results from Dr. Thurman Coyer office.   Past Medical History:  Diagnosis Date  . Anxiety   . Arthritis   . Cancer [facility])    dx 12/12/2016 with right breast cancer  . Depression   . Family history of breast cancer   . Genetic testing 12/26/2016   Breast/GYN panel (23 genes) @ [facility] - No pathogenic mutations detected  . GERD (gastroesophageal reflux disease)   . Headache   . History of radiation therapy 03/13/17- 04/09/17   Right Breast, 2.67 Gy  X 15 fractions, and right breast boost 2 Gy X 5 fractions.   . Hyperlipidemia   . Hypertension   . Left bundle branch block   . Primary localized osteoarthritis of right hip 01/07/2017  . Sleep apnea    CPAP q night   .  Unspecified vitamin D deficiency     Past Surgical History:  Procedure Laterality Date  . BREAST LUMPECTOMY WITH RADIOACTIVE SEED AND SENTINEL LYMPH NODE BIOPSY Right 02/03/2017   Procedure: BREAST LUMPECTOMY WITH RADIOACTIVE SEED AND SENTINEL LYMPH NODE BIOPSY WITH ADDITIONAL TISSUE MARGINS;  Surgeon: Jovita Kussmaul, MD;  Location: [facility];  Service: General;  Laterality: Right;  . CESAREAN SECTION  11/03/1990   /w epidural   . DENTAL SURGERY     fillings and crown  . TOTAL HIP ARTHROPLASTY Right 01/07/2017   Procedure: TOTAL HIP ARTHROPLASTY;  Surgeon: Marchia Bond, MD;   Location: [facility];  Service: Orthopedics;  Laterality: Right;    MEDICATIONS: . ALPRAZolam (XANAX) 0.5 MG tablet  . amitriptyline (ELAVIL) 10 MG tablet  . Azelaic Acid 15 % cream  . baclofen (LIORESAL) 10 MG tablet  . calcium carbonate (OS-CAL) 600 MG tablet  . Cholecalciferol (VITAMIN D3) 5000 units CAPS  . clotrimazole-betamethasone (LOTRISONE) cream  . cyclobenzaprine (FLEXERIL) 10 MG tablet  . hydrochlorothiazide (HYDRODIURIL) 25 MG tablet  . letrozole (FEMARA) 2.5 MG tablet  . Magnesium 250 MG TABS  . meloxicam (MOBIC) 15 MG tablet  . Potassium 99 MG TABS  . ranitidine (ZANTAC) 150 MG tablet  . sertraline (ZOLOFT) 100 MG tablet  . SUMAtriptan (IMITREX) 100 MG tablet  . traMADol (ULTRAM) 50 MG tablet  . zinc gluconate 50 MG tablet   No current facility-administered medications for this encounter.     George Hugh [facility] Short Stay Center/Anesthesiology Phone [phone] 07/28/2017 12:17 PM

## 2017-08-05 ENCOUNTER — Inpatient Hospital Stay: Admit: 2017-08-05 | Payer: PRIVATE HEALTH INSURANCE | Admitting: Orthopedic Surgery

## 2017-08-05 ENCOUNTER — Inpatient Hospital Stay: Payer: PRIVATE HEALTH INSURANCE | Admitting: Adult Health

## 2017-08-05 SURGERY — ARTHROPLASTY, HIP, TOTAL,POSTERIOR APPROACH
Anesthesia: Choice | Site: Hip | Laterality: Left

## 2017-12-22 IMAGING — CR DG KNEE 1-2V*R*
2 series · 2 of 2 positions shown · non-contrast
Comparison: Right knee MRI March 17, 2007

CLINICAL DATA: Chronic pain

EXAM:
RIGHT KNEE - 1-2 VIEW

[w knee ap right]
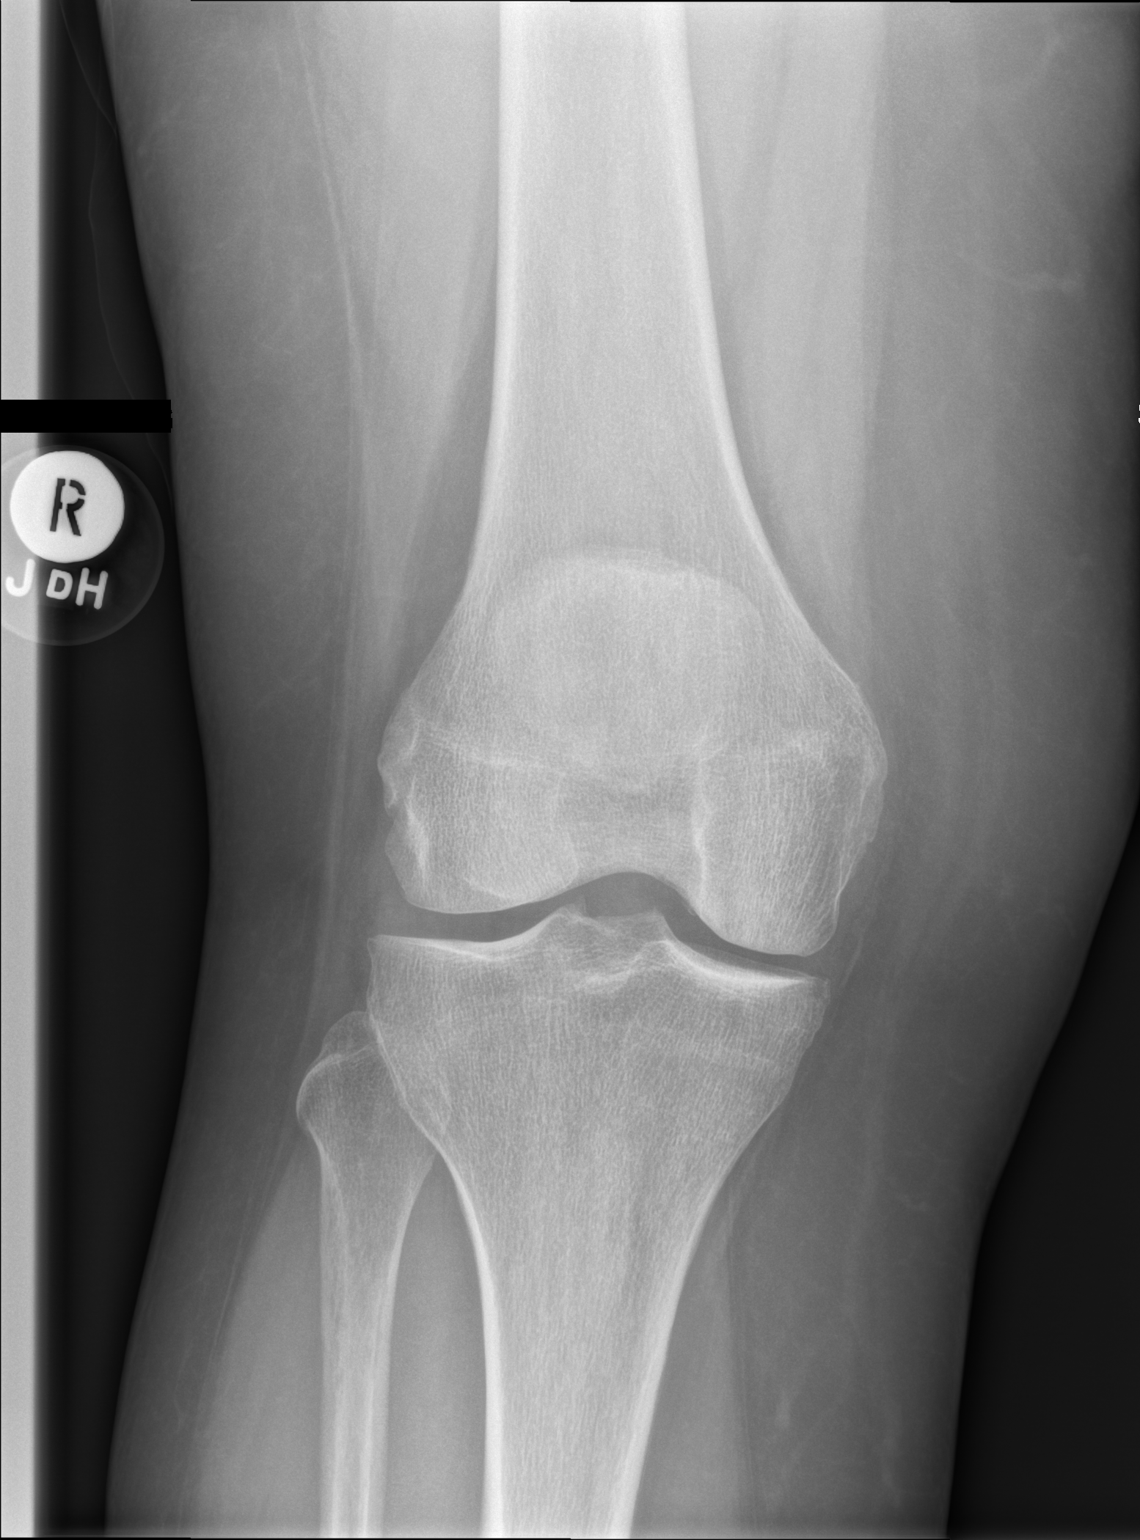

[w knee lat right]
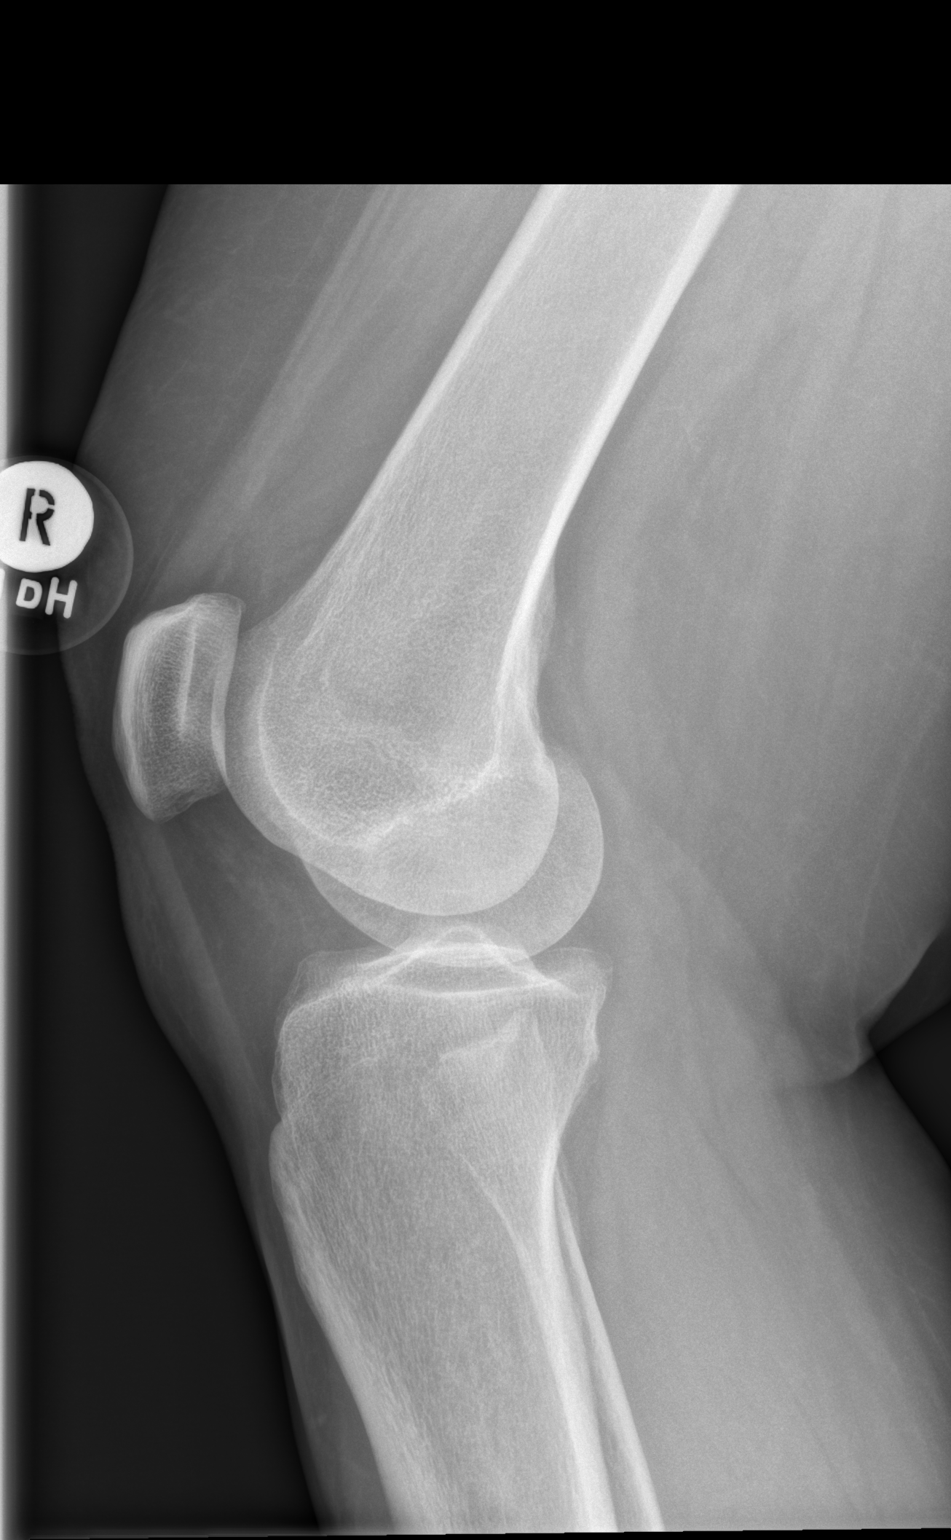

[2 of 2 positions shown; findings below may reference images not displayed]

FINDINGS: Standing frontal and standing lateral views were obtained. No
fracture or dislocation. No joint effusion. No appreciable joint
space narrowing or erosion. No intra-articular calcification.
IMPRESSION: No fracture or joint effusion. No appreciable arthropathic change by
radiography.

## 2017-12-22 IMAGING — CR DG KNEE 1-2V*L*
2 series · 2 of 2 positions shown · non-contrast
Comparison: MRI of the left knee in March 17, 2007

CLINICAL DATA: Ionic bilateral knee pain with no known trauma.

EXAM:
LEFT KNEE - 1-2 VIEW

[w knee ap left]
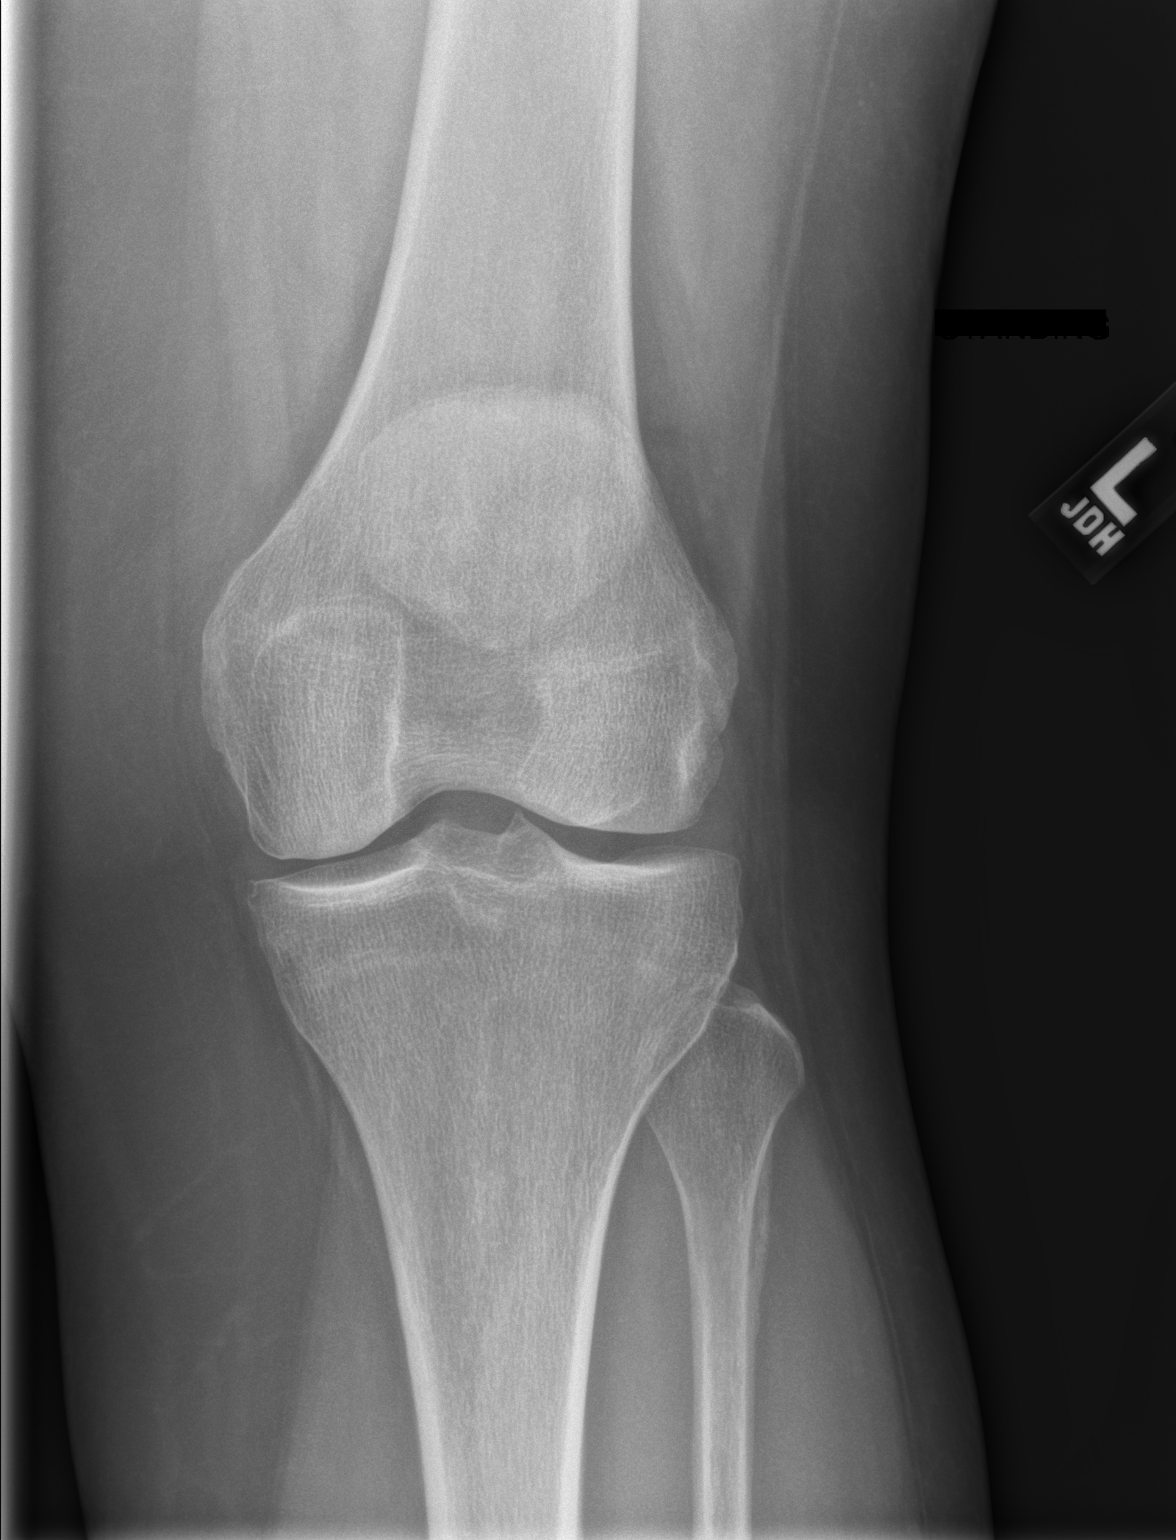

[w knee lat left]
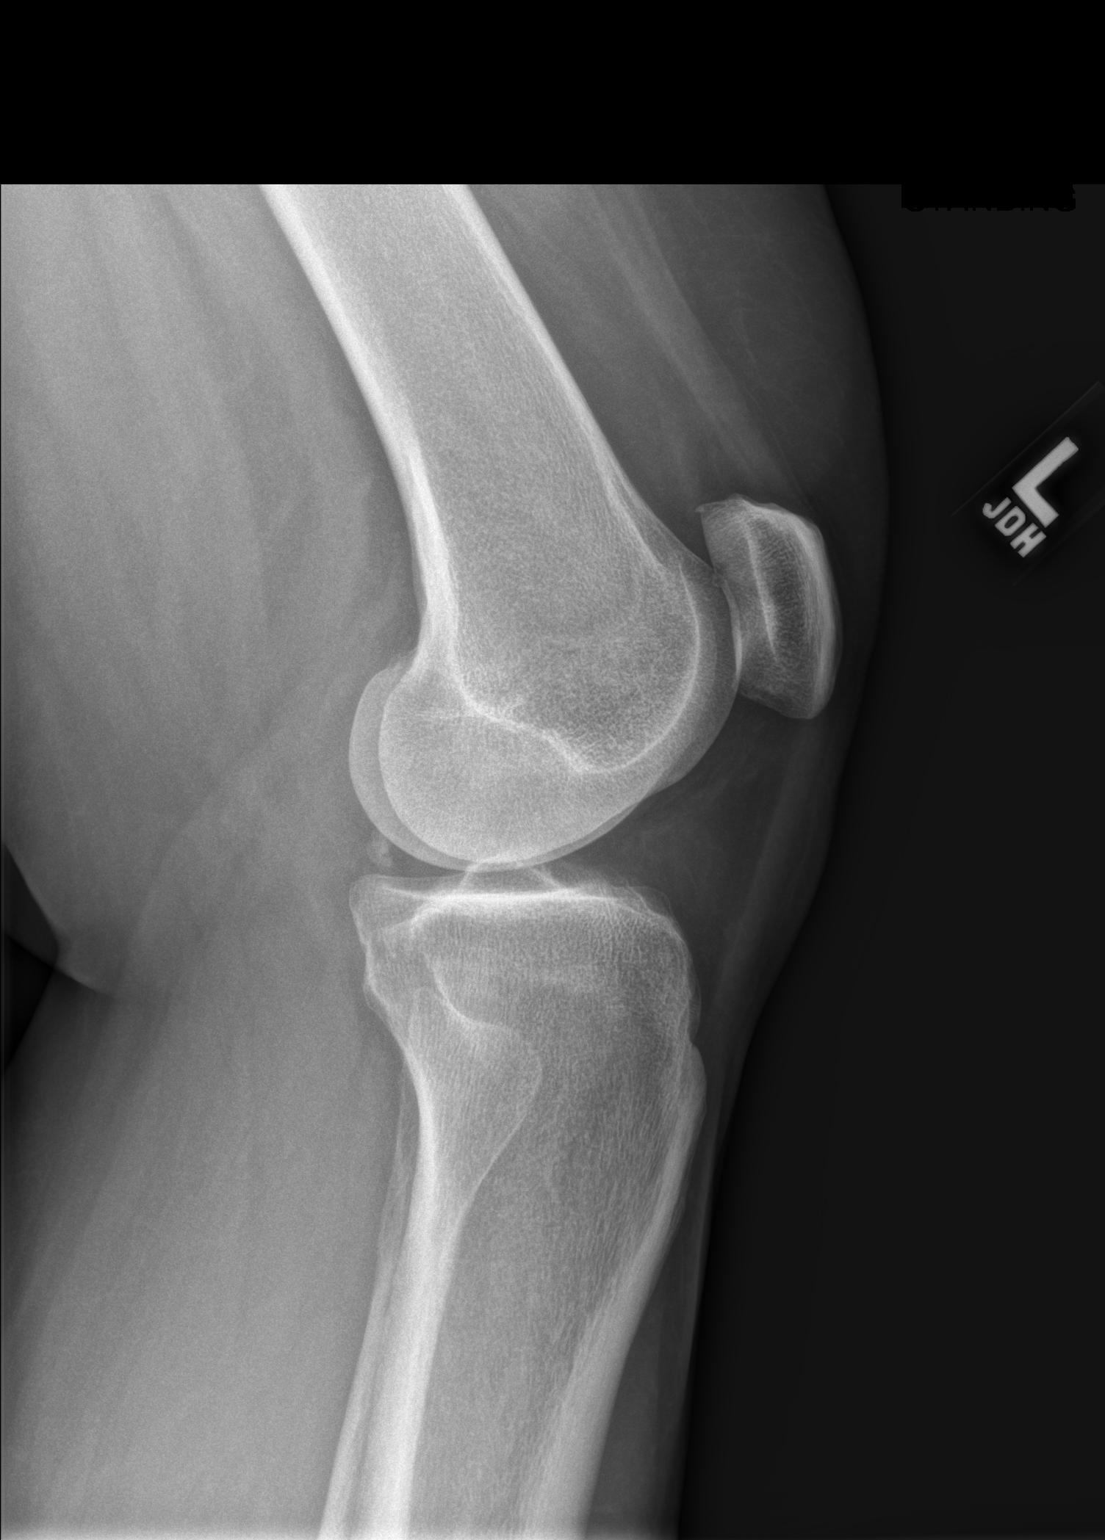

[2 of 2 positions shown; findings below may reference images not displayed]

FINDINGS: The bones are subjectively adequately mineralized. The joint spaces
are well maintained. There is beaking of the tibial spines. Spurs
arise from the superior and inferior articular margins of the
patella. There is no acute fracture or dislocation. There is no
joint effusion.
IMPRESSION: Mild osteoarthritic beaking of the tibial spines and of the
articular margins of the patella. There is no acute bony
abnormality.

## 2019-01-08 ENCOUNTER — Other Ambulatory Visit: Payer: Self-pay

## 2019-01-08 DIAGNOSIS — Z20822 Contact with and (suspected) exposure to covid-19: Secondary | ICD-10-CM

## 2019-01-09 LAB — NOVEL CORONAVIRUS, NAA: SARS-CoV-2, NAA: NOT DETECTED

## 2020-01-31 ENCOUNTER — Telehealth: Payer: Self-pay | Admitting: *Deleted

## 2020-01-31 NOTE — Telephone Encounter (Signed)
   [facility] Pre-operative Risk Assessment    HEARTCARE STAFF: - Please ensure there is not already an duplicate clearance open for this procedure. - Under Visit Info/Reason for Call, type in Other and utilize the format Clearance MM/DD/YY or Clearance TBD. Do not use dashes or single digits. - If request is for dental extraction, please clarify the # of teeth to be extracted.  Request for surgical clearance:  1. What type of surgery is being performed? LEFT TOTAL HIP REPLACEMENT   2. When is this surgery scheduled? TBD   3. What type of clearance is required (medical clearance vs. Pharmacy clearance to hold med vs. Both)? MEDICAL  4. Are there any medications that need to be held prior to surgery and how long? NONE LISTED    5. Practice name and name of physician performing surgery? [facility]; DR. Vonna Kotyk [place]  6. What is the office phone number? [phone]   7.   What is the office fax number? [place]   Anesthesia type (None, local, MAC, general) ? CHOICE   Julaine Hua 01/31/2020, 1:41 PM  _________________________________________________________________   (provider comments below)

## 2020-01-31 NOTE — Telephone Encounter (Signed)
Will forward to Dr. Quentin Ore for New Pt appt.

## 2020-02-09 ENCOUNTER — Ambulatory Visit: Payer: No Typology Code available for payment source | Admitting: Cardiology

## 2020-02-24 ENCOUNTER — Encounter (HOSPITAL_COMMUNITY): Payer: Self-pay | Admitting: *Deleted

## 2020-02-24 ENCOUNTER — Encounter: Payer: Self-pay | Admitting: Internal Medicine

## 2020-02-24 ENCOUNTER — Ambulatory Visit: Payer: Medicare Other | Admitting: Internal Medicine

## 2020-02-24 ENCOUNTER — Ambulatory Visit (HOSPITAL_COMMUNITY): Payer: Medicare Other | Attending: Cardiology

## 2020-02-24 ENCOUNTER — Other Ambulatory Visit: Payer: Self-pay

## 2020-02-24 VITALS — BP 140/90 | HR 75 | Ht 68.0 in | Wt 239.0 lb

## 2020-02-24 DIAGNOSIS — Z01818 Encounter for other preprocedural examination: Secondary | ICD-10-CM | POA: Insufficient documentation

## 2020-02-24 DIAGNOSIS — C50211 Malignant neoplasm of upper-inner quadrant of right female breast: Secondary | ICD-10-CM

## 2020-02-24 DIAGNOSIS — Z170 Estrogen receptor positive status [ER+]: Secondary | ICD-10-CM | POA: Insufficient documentation

## 2020-02-24 DIAGNOSIS — Z01810 Encounter for preprocedural cardiovascular examination: Secondary | ICD-10-CM | POA: Diagnosis not present

## 2020-02-24 DIAGNOSIS — I447 Left bundle-branch block, unspecified: Secondary | ICD-10-CM

## 2020-02-24 DIAGNOSIS — Z9189 Other specified personal risk factors, not elsewhere classified: Secondary | ICD-10-CM | POA: Insufficient documentation

## 2020-02-24 DIAGNOSIS — I10 Essential (primary) hypertension: Secondary | ICD-10-CM | POA: Diagnosis not present

## 2020-02-24 DIAGNOSIS — E785 Hyperlipidemia, unspecified: Secondary | ICD-10-CM | POA: Insufficient documentation

## 2020-02-24 DIAGNOSIS — Z9229 Personal history of other drug therapy: Secondary | ICD-10-CM

## 2020-02-24 LAB — ECHOCARDIOGRAM COMPLETE
Area-P 1/2: 2.51 cm2
Height: 68 in
S' Lateral: 4.1 cm
Weight: 3824 oz

## 2020-02-24 NOTE — Patient Instructions (Signed)
Medication Instructions:  Your provider recommends that you continue on your current medications as directed. Please refer to the Current Medication list given to you today.   *If you need a refill on your cardiac medications before your next appointment, please call your pharmacy*  Testing/Procedures: Your provider has requested that you have a lexiscan myoview. For further information please visit [email or web address]. Please follow instruction sheet, as given.  Your provider has requested that you have an echocardiogram. Echocardiography is a painless test that uses sound waves to create images of your heart. It provides your doctor with information about the size and shape of your heart and how well your heart's chambers and valves are working. This procedure takes approximately one hour. There are no restrictions for this procedure.  Follow-Up: At [facility], you and your health needs are our priority.  As part of our continuing mission to provide you with exceptional heart care, we have created designated Provider Care Teams.  These Care Teams include your primary Cardiologist (physician) and Advanced Practice Providers (APPs -  Physician Assistants and Nurse Practitioners) who all work together to provide you with the care you need, when you need it. Your next appointment:   3 month(s) The format for your next appointment:   In Person Provider:   Rudean Haskell, MD

## 2020-02-24 NOTE — Progress Notes (Signed)
Tina Ponce:    Date:  02/24/2020   ID:  [name], DOB Feb 23, 1966, MRN [id number]  PCP:  Tina Frees, MD  [facility] Cardiologist:  No primary care provider on file.  [facility] Electrophysiologist:  None   CC: Preoperative risk stratification Consulted for the evaluation of preoperative visit at the behest of Tina Frees, MD   History of Present Illness:    Tina Ponce is a 54 y.o. female with a hx of R Breast Cancer, Known LBBB, Morbid Obesity with HTN, OSA on CPAP, and HLD; distant smoker who presents for evaluation.  L Hip Raliegh Ip Unclear Anesthesia  Oncological History notable for: Malignancies: ER Positive R Breast Cancer Surgery: Lumpectomy 2018 Chemotherapy: N/A Cessations for Toxicity: N/A Radiation: 50 Gy in target that could include heart Oncology care spearheaded by: Tina Ponce  Patient notes that she is feeling constantly fatigued.  Has had no chest pain, chest pressure, chest tightness, chest stinging .  Has shortness of breath with minimal exertion.  Discomfort occurs with walking, thought is able to walk here today.  Notes that she is unable to walk one block or do basic house work because of hip pain; no prior interventions have helped this.   Patient exertion notable for just walking leading to shortness of breath (doing her aADLs).  Able to walk to visit without DOE.   No PND or orthopnea.  No bendopnea, weight gain, leg swelling , or abdominal swelling.  No syncope or near syncope.  Patient reports prior cardiac testing including negative  stress test 2016 Tina Ponce) prior to consideration for gastric bypass.  No history of pre-eclampsia (one prior child); had heart problems NOS as an infant.  Distant Fen-Phen; no drug use.  Ambulatory BP not done.   Past Medical History:  Diagnosis Date  . Anxiety   . Arthritis   . Cancer [facility])    dx 12/12/2016 with right breast cancer  . Depression   . Family history of breast  cancer   . Genetic testing 12/26/2016   Breast/GYN panel (23 genes) @ [facility] - No pathogenic mutations detected  . GERD (gastroesophageal reflux disease)   . Headache   . History of radiation therapy 03/13/17- 04/09/17   Right Breast, 2.67 Gy  X 15 fractions, and right breast boost 2 Gy X 5 fractions.   . Hyperlipidemia   . Hypertension   . Left bundle branch block   . Primary localized osteoarthritis of right hip 01/07/2017  . Sleep apnea    CPAP q night   . Unspecified vitamin D deficiency     Past Surgical History:  Procedure Laterality Date  . BREAST LUMPECTOMY WITH RADIOACTIVE SEED AND SENTINEL LYMPH NODE BIOPSY Right 02/03/2017   Procedure: BREAST LUMPECTOMY WITH RADIOACTIVE SEED AND SENTINEL LYMPH NODE BIOPSY WITH ADDITIONAL TISSUE MARGINS;  Surgeon: Jovita Kussmaul, MD;  Location: [place];  Service: General;  Laterality: Right;  . CESAREAN SECTION  11/03/1990   /w epidural   . DENTAL SURGERY     fillings and crown  . TOTAL HIP ARTHROPLASTY Right 01/07/2017   Procedure: TOTAL HIP ARTHROPLASTY;  Surgeon: Marchia Bond, MD;  Location: [place];  Service: Orthopedics;  Laterality: Right;    Current Medications: Current Meds  Medication Sig  . ALPRAZolam (XANAX) 0.5 MG tablet Take 0.5 mg by mouth 2 (two) times daily.  Marland Kitchen amitriptyline (ELAVIL) 25 MG tablet Take 25 mg by mouth at bedtime.  . Azelaic Acid 15 % cream Apply  1 application topically daily.  . baclofen (LIORESAL) 10 MG tablet Take 10 mg by mouth 3 (three) times daily.  . calcium carbonate (OS-CAL) 600 MG tablet Take 600 mg by mouth daily.  . Cholecalciferol (VITAMIN D3) 5000 units CAPS Take 5,000 Units by mouth daily.  . cyclobenzaprine (FLEXERIL) 10 MG tablet Take 1 tablet (10 mg total) by mouth every 8 (eight) hours as needed for muscle spasms.  . famotidine (PEPCID) 10 MG tablet Take 10 mg by mouth 2 (two) times daily.  . meloxicam (MOBIC) 15 MG tablet Take 15 mg by mouth daily.   . sertraline (ZOLOFT) 100 MG tablet  Take 100 mg by mouth 2 (two) times daily.   . SUMAtriptan (IMITREX) 100 MG tablet Take 100 mg by mouth every 2 (two) hours as needed for migraine. May repeat in 2 hours if headache persists or recurs.  . traMADol (ULTRAM) 50 MG tablet Take 50 mg by mouth every 6 (six) hours as needed for moderate pain.      Allergies:   Avelox [moxifloxacin hcl in nacl], Codeine, Moxifloxacin, Flagyl [metronidazole], Sulfamethoxazole-trimethoprim, and Latex   Social History   Socioeconomic History  . Marital status: Single    Spouse name: Not on file  . Number of children: Not on file  . Years of education: Not on file  . Highest education level: Not on file  Occupational History  . Not on file  Tobacco Use  . Smoking status: Former Smoker    Quit date: 03/20/2005    Years since quitting: 14.9  . Smokeless tobacco: Never Used  Vaping Use  . Vaping Use: Never used  Substance and Sexual Activity  . Alcohol use: No  . Drug use: No  . Sexual activity: Not on file  Other Topics Concern  . Not on file  Social History Narrative  . Not on file   Social Determinants of Health   Financial Resource Strain: Not on file  Food Insecurity: Not on file  Transportation Needs: Not on file  Physical Activity: Not on file  Stress: Not on file  Social Connections: Not on file    Family History: The patient's family history includes Breast cancer in her maternal aunt; Breast cancer (age of onset: 54) in her sister; Breast cancer (age of onset: 51) in her sister; Cancer in her father and maternal uncle; Diabetes in her mother; Hypertension in her mother; Ovarian cancer in her maternal aunt; Prostate cancer (age of onset: 74) in her maternal grandfather.  ROS:   Please see the history of present illness.     All other systems reviewed and are negative.  EKGs/Labs/Other Studies Reviewed:    The following studies were reviewed today:  EKG:  EKG is  ordered today.  The ekg ordered today demonstrates SR LBBB  QTc 480  Recent Labs: No results found for requested labs within last 8760 hours.  Recent Lipid Panel No results found for: CHOL, TRIG, HDL, CHOLHDL, VLDL, LDLCALC, LDLDIRECT  Risk Assessment/Calculations:     RCRI 0  Physical Exam:    VS:  BP 140/90   Pulse 75   Ht 5\' 8"  (1.727 m)   Wt 239 lb (108.4 kg)   SpO2 97%   BMI 36.34 kg/m     Wt Readings from Last 3 Encounters:  02/24/20 239 lb (108.4 kg)  07/25/17 235 lb 4.8 oz (106.7 kg)  04/07/17 236 lb 8 oz (107.3 kg)     GEN:  Obese well developed in no  acute distress HEENT: Normal NECK: No JVD; No carotid bruits LYMPHATICS: No lymphadenopathy CARDIAC: RRR, no murmurs, rubs, gallops RESPIRATORY:  Clear to auscultation without rales, wheezing or rhonchi  ABDOMEN: Soft, non-tender, non-distended MUSCULOSKELETAL:  No edema; No deformity  SKIN: Warm and dry NEUROLOGIC:  Alert and oriented x 3 resting termor bilateral hands PSYCHIATRIC:  Normal affect   ASSESSMENT:    1. Preoperative clearance   2. Malignant neoplasm of upper-inner quadrant of right breast in female, estrogen receptor positive ([place])   3. Morbid obesity ([place])   4. LBBB (left bundle branch block)   5. Essential hypertension   6. Hyperlipidemia, unspecified hyperlipidemia type   7. Fen-phen exposure    PLAN:    In order of problems listed above:  Preoperative Risk Assessment LBBB - The Revised Cardiac Risk Index = 0=0.4%: very low risk of perioperative myocardial infarction, pulmonary edema, ventricular fibrillation, cardiac arrest, or complete heart block.  - DASI score of 4.5 associated with 3.3 functional mets - Low fMETs and (SOB)symptoms of pharmacological NM  stress test is recommended prior to proceeding with their planned procedure.  - will reach out to Raliegh Ip after results - Our service is available as needed in the peri-operative period.    Prior Malignancy with Cardiotoxic Chemotherapy/Radiation Morbid Obesity with prior FenPhen  use  HTN, HLD - LV Function including GLS or other Strain, 3D Eval, MAPSE, or CMR: Unknown - with high dose radiotherapy (?30 Gy) where the heart is in the treatment field  - would get echocardiogram - Encouraged exercise on a regular basis and healthy dietary habits - post hip surgery, will evaluate lipids and readdress HTN, would recommend ambulatory BP monitoring  Three months follow up unless new symptoms or abnormal test results warranting change in plan  Would be reasonable for  APP Follow up   Shared Decision Making/Informed Consent The risks [chest pain, shortness of breath, cardiac arrhythmias, dizziness, blood pressure fluctuations, myocardial infarction, stroke/transient ischemic attack, nausea, vomiting, allergic reaction, radiation exposure, metallic taste sensation and life-threatening complications (estimated to be 1 in 10,000)], benefits (risk stratification, diagnosing coronary artery disease, treatment guidance) and alternatives of a nuclear stress test were discussed in detail with Tina Ponce and she agrees to proceed.  The risks [esophageal damage, perforation (1:10,000 risk), bleeding, pharyngeal hematoma as well as other potential complications associated with conscious sedation including aspiration, arrhythmia, respiratory failure and death], benefits (treatment guidance and diagnostic support) and alternatives of a transesophageal echocardiogram were discussed in detail with Tina Ponce and she is willing to proceed.       Medication Adjustments/Labs and Tests Ordered: Current medicines are reviewed at length with the patient today.  Concerns regarding medicines are outlined above.  Orders Placed This Encounter  Procedures  . MYOCARDIAL PERFUSION IMAGING  . EKG 12-Lead  . ECHOCARDIOGRAM COMPLETE   No orders of the defined types were placed in this encounter.   Patient Instructions  Medication Instructions:  Your provider recommends that you continue on your  current medications as directed. Please refer to the Current Medication list given to you today.   *If you need a refill on your cardiac medications before your next appointment, please call your pharmacy*  Testing/Procedures: Your provider has requested that you have a lexiscan myoview. For further information please visit [email or web address]. Please follow instruction sheet, as given.  Your provider has requested that you have an echocardiogram. Echocardiography is a painless test that uses sound waves to create images of your heart. It  provides your doctor with information about the size and shape of your heart and how well your heart's chambers and valves are working. This procedure takes approximately one hour. There are no restrictions for this procedure.  Follow-Up: At [facility], you and your health needs are our priority.  As part of our continuing mission to provide you with exceptional heart care, we have created designated Provider Care Teams.  These Care Teams include your primary Cardiologist (physician) and Advanced Practice Providers (APPs -  Physician Assistants and Nurse Practitioners) who all work together to provide you with the care you need, when you need it. Your next appointment:   3 month(s) The format for your next appointment:   In Person Provider:   Rudean Haskell, MD     Signed, Werner Lean, MD  02/24/2020 11:09 AM    [place]

## 2020-02-28 ENCOUNTER — Ambulatory Visit (HOSPITAL_COMMUNITY): Payer: Medicare Other | Attending: Cardiology

## 2020-02-28 ENCOUNTER — Other Ambulatory Visit: Payer: Self-pay

## 2020-02-28 DIAGNOSIS — Z170 Estrogen receptor positive status [ER+]: Secondary | ICD-10-CM | POA: Diagnosis not present

## 2020-02-28 DIAGNOSIS — I10 Essential (primary) hypertension: Secondary | ICD-10-CM | POA: Insufficient documentation

## 2020-02-28 DIAGNOSIS — Z01810 Encounter for preprocedural cardiovascular examination: Secondary | ICD-10-CM

## 2020-02-28 DIAGNOSIS — Z01818 Encounter for other preprocedural examination: Secondary | ICD-10-CM | POA: Diagnosis present

## 2020-02-28 DIAGNOSIS — I447 Left bundle-branch block, unspecified: Secondary | ICD-10-CM | POA: Diagnosis not present

## 2020-02-28 DIAGNOSIS — C50211 Malignant neoplasm of upper-inner quadrant of right female breast: Secondary | ICD-10-CM | POA: Diagnosis not present

## 2020-02-28 LAB — MYOCARDIAL PERFUSION IMAGING
LV dias vol: 146 mL (ref 46–106)
LV sys vol: 88 mL
Peak HR: 108 {beats}/min
Rest HR: 59 {beats}/min
SDS: 0
SRS: 4
SSS: 4
TID: 1.13

## 2020-02-28 MED ORDER — TECHNETIUM TC 99M TETROFOSMIN IV KIT
32.3000 | PACK | Freq: Once | INTRAVENOUS | Status: AC | PRN
  Administered 2020-02-28: 32.3 via INTRAVENOUS
  Filled 2020-02-28: qty 33

## 2020-02-28 MED ORDER — REGADENOSON 0.4 MG/5ML IV SOLN
0.4000 mg | Freq: Once | INTRAVENOUS | Status: AC
Start: 2020-02-28 — End: 2020-02-28
  Administered 2020-02-28: 0.4 mg via INTRAVENOUS

## 2020-02-28 MED ORDER — TECHNETIUM TC 99M TETROFOSMIN IV KIT
10.2000 | PACK | Freq: Once | INTRAVENOUS | Status: AC | PRN
  Administered 2020-02-28: 10.2 via INTRAVENOUS
  Filled 2020-02-28: qty 11

## 2020-03-21 ENCOUNTER — Ambulatory Visit: Payer: Medicare Other | Admitting: Internal Medicine

## 2020-03-22 DIAGNOSIS — Z1152 Encounter for screening for COVID-19: Secondary | ICD-10-CM | POA: Diagnosis not present

## 2020-04-13 ENCOUNTER — Other Ambulatory Visit: Payer: Self-pay

## 2020-04-13 ENCOUNTER — Ambulatory Visit: Payer: Medicare Other | Admitting: Internal Medicine

## 2020-04-13 ENCOUNTER — Encounter: Payer: Self-pay | Admitting: Internal Medicine

## 2020-04-13 ENCOUNTER — Telehealth: Payer: Self-pay | Admitting: *Deleted

## 2020-04-13 VITALS — BP 138/88 | HR 93 | Ht 68.0 in | Wt 239.0 lb

## 2020-04-13 DIAGNOSIS — I447 Left bundle-branch block, unspecified: Secondary | ICD-10-CM | POA: Diagnosis not present

## 2020-04-13 DIAGNOSIS — I10 Essential (primary) hypertension: Secondary | ICD-10-CM | POA: Diagnosis not present

## 2020-04-13 MED ORDER — ISOSORBIDE MONONITRATE ER 30 MG PO TB24: 15.0000 mg | ORAL_TABLET | Freq: Every day | ORAL | 1 refills | Status: DC

## 2020-04-13 MED ORDER — METOPROLOL TARTRATE 25 MG PO TABS: 12.5000 mg | ORAL_TABLET | Freq: Two times a day (BID) | ORAL | 3 refills | Status: DC

## 2020-04-13 NOTE — Telephone Encounter (Signed)
  Patient Consent for Virtual Visit         Tina Ponce has provided verbal consent on 04/13/2020 for a virtual visit (video or telephone).   CONSENT FOR VIRTUAL VISIT FOR:  Tina Ponce  By participating in this virtual visit I agree to the following:  I hereby voluntarily request, consent and authorize [facility] and its employed or contracted physicians, physician assistants, nurse practitioners or other licensed health care professionals (the Practitioner), to provide me with telemedicine health care services (the "Services") as deemed necessary by the treating Practitioner. I acknowledge and consent to receive the Services by the Practitioner via telemedicine. I understand that the telemedicine visit will involve communicating with the Practitioner through live audiovisual communication technology and the disclosure of certain medical information by electronic transmission. I acknowledge that I have been given the opportunity to request an in-person assessment or other available alternative prior to the telemedicine visit and am voluntarily participating in the telemedicine visit.  I understand that I have the right to withhold or withdraw my consent to the use of telemedicine in the course of my care at any time, without affecting my right to future care or treatment, and that the Practitioner or I may terminate the telemedicine visit at any time. I understand that I have the right to inspect all information obtained and/or recorded in the course of the telemedicine visit and may receive copies of available information for a reasonable fee.  I understand that some of the potential risks of receiving the Services via telemedicine include:  Marland Kitchen Delay or interruption in medical evaluation due to technological equipment failure or disruption; . Information transmitted may not be sufficient (e.g. poor resolution of images) to allow for appropriate medical decision making by the  Practitioner; and/or  . In rare instances, security protocols could fail, causing a breach of personal health information.  Furthermore, I acknowledge that it is my responsibility to provide information about my medical history, conditions and care that is complete and accurate to the best of my ability. I acknowledge that Practitioner's advice, recommendations, and/or decision may be based on factors not within their control, such as incomplete or inaccurate data provided by me or distortions of diagnostic images or specimens that may result from electronic transmissions. I understand that the practice of medicine is not an exact science and that Practitioner makes no warranties or guarantees regarding treatment outcomes. I acknowledge that a copy of this consent can be made available to me via my patient portal ([place]), or I can request a printed copy by calling the office of [facility].    I understand that my insurance will be billed for this visit.   I have read or had this consent read to me. . I understand the contents of this consent, which adequately explains the benefits and risks of the Services being provided via telemedicine.  . I have been provided ample opportunity to ask questions regarding this consent and the Services and have had my questions answered to my satisfaction. . I give my informed consent for the services to be provided through the use of telemedicine in my medical care

## 2020-04-13 NOTE — Progress Notes (Signed)
Cardiology Office Note:    Date:  04/13/2020   ID:  Tina Ponce, DOB 1965-07-07, MRN [id number]  PCP:  Shirline Frees, MD  [facility] Cardiologist:  No primary care provider on file.  [facility] Electrophysiologist:  None   CC: Positive Stress Test  History of Present Illness:    Tina Ponce is a 55 y.o. female with a hx of R Breast Cancer, Known LBBB, Morbid Obesity with HTN, OSA on CPAP, and HLD; distant smoker who presents for evaluation 02/24/20 for evaluation of L Hip Surgery ([facility]).  In interval had echo WNL, but evidence of a perfusion defect on NM Stress.  Started on ASA, had not received BB or and PRN nitroglycerin.  L Hip Raliegh Ip Unclear Anesthesia  Oncological History notable for: Malignancies: ER Positive R Breast Cancer Surgery: Lumpectomy 2018 Chemotherapy: N/A Cessations for Toxicity: N/A Radiation: 50 Gy in target that could include heart Oncology care spearheaded by: Lindi Adie  Patient notes that she is doing worse since last visit but has had Delta in late January.  Has had worsening refllux.  Worsening SOB since getting COVID but getting better.  There are no interval hospital/ED visit.    No chest pain or pressure.  DOE is persistent.  No weight gain or leg swelling.  No palpitations or syncope.  Ambulatory blood pressure 134/80.   Past Medical History:  Diagnosis Date  . Anxiety   . Arthritis   . Cancer [facility])    dx 12/12/2016 with right breast cancer  . Depression   . Family history of breast cancer   . Genetic testing 12/26/2016   Breast/GYN panel (23 genes) @ [facility] - No pathogenic mutations detected  . GERD (gastroesophageal reflux disease)   . Headache   . History of radiation therapy 03/13/17- 04/09/17   Right Breast, 2.67 Gy  X 15 fractions, and right breast boost 2 Gy X 5 fractions.   . Hyperlipidemia   . Hypertension   . Left bundle branch block   . Primary localized osteoarthritis of right hip 01/07/2017  .  Sleep apnea    CPAP q night   . Unspecified vitamin D deficiency     Past Surgical History:  Procedure Laterality Date  . BREAST LUMPECTOMY WITH RADIOACTIVE SEED AND SENTINEL LYMPH NODE BIOPSY Right 02/03/2017   Procedure: BREAST LUMPECTOMY WITH RADIOACTIVE SEED AND SENTINEL LYMPH NODE BIOPSY WITH ADDITIONAL TISSUE MARGINS;  Surgeon: Jovita Kussmaul, MD;  Location: [place];  Service: General;  Laterality: Right;  . CESAREAN SECTION  11/03/1990   /w epidural   . DENTAL SURGERY     fillings and crown  . TOTAL HIP ARTHROPLASTY Right 01/07/2017   Procedure: TOTAL HIP ARTHROPLASTY;  Surgeon: Marchia Bond, MD;  Location: [place];  Service: Orthopedics;  Laterality: Right;    Current Medications: Current Meds  Medication Sig  . ALPRAZolam (XANAX) 0.5 MG tablet Take 0.5 mg by mouth 2 (two) times daily.  Marland Kitchen amitriptyline (ELAVIL) 25 MG tablet Take 25 mg by mouth at bedtime.  . Azelaic Acid 15 % cream Apply 1 application topically daily.  . baclofen (LIORESAL) 10 MG tablet Take 10 mg by mouth 3 (three) times daily.  . calcium carbonate (OS-CAL) 600 MG tablet Take 600 mg by mouth daily.  . Cholecalciferol (VITAMIN D3) 5000 units CAPS Take 5,000 Units by mouth daily.  . famotidine (PEPCID) 10 MG tablet Take 10 mg by mouth 2 (two) times daily.  . isosorbide mononitrate (IMDUR) 30 MG  24 hr tablet Take 0.5 tablets (15 mg total) by mouth daily.  . meloxicam (MOBIC) 15 MG tablet Take 15 mg by mouth daily.   . metoprolol tartrate (LOPRESSOR) 25 MG tablet Take 0.5 tablets (12.5 mg total) by mouth 2 (two) times daily.  . sertraline (ZOLOFT) 100 MG tablet Take 100 mg by mouth 2 (two) times daily.   . traMADol (ULTRAM) 50 MG tablet Take 50 mg by mouth every 6 (six) hours as needed for moderate pain.   . [DISCONTINUED] SUMAtriptan (IMITREX) 100 MG tablet Take 100 mg by mouth every 2 (two) hours as needed for migraine. May repeat in 2 hours if headache persists or recurs.     Allergies:   Avelox [moxifloxacin  hcl in nacl], Codeine, Moxifloxacin, Flagyl [metronidazole], Sulfamethoxazole-trimethoprim, and Latex   Social History   Socioeconomic History  . Marital status: Single    Spouse name: Not on file  . Number of children: Not on file  . Years of education: Not on file  . Highest education level: Not on file  Occupational History  . Not on file  Tobacco Use  . Smoking status: Former Smoker    Quit date: 03/20/2005    Years since quitting: 15.0  . Smokeless tobacco: Never Used  Vaping Use  . Vaping Use: Never used  Substance and Sexual Activity  . Alcohol use: No  . Drug use: No  . Sexual activity: Not on file  Other Topics Concern  . Not on file  Social History Narrative  . Not on file   Social Determinants of Health   Financial Resource Strain: Not on file  Food Insecurity: Not on file  Transportation Needs: Not on file  Physical Activity: Not on file  Stress: Not on file  Social Connections: Not on file    Family History: The patient's family history includes Breast cancer in her maternal aunt; Breast cancer (age of onset: 82) in her sister; Breast cancer (age of onset: 61) in her sister; Cancer in her father and maternal uncle; Diabetes in her mother; Hypertension in her mother; Ovarian cancer in her maternal aunt; Prostate cancer (age of onset: 51) in her maternal grandfather.  ROS:   Please see the history of present illness.     All other systems reviewed and are negative.  EKGs/Labs/Other Studies Reviewed:    The following studies were reviewed today:  EKG:   04/13/20: SR rate 77 LBBB QTc 482 02/24/2020: SR LBBB QTc 480  Recent Labs: No results found for requested labs within last 8760 hours.  Recent Lipid Panel No results found for: CHOL, TRIG, HDL, CHOLHDL, VLDL, LDLCALC, LDLDIRECT  Risk Assessment/Calculations:    N/A  Physical Exam:    VS:  BP 138/88   Pulse 93   Ht 5\' 8"  (1.727 m)   Wt 239 lb (108.4 kg)   SpO2 98%   BMI 36.34 kg/m     Wt  Readings from Last 3 Encounters:  04/13/20 239 lb (108.4 kg)  02/28/20 239 lb (108.4 kg)  02/24/20 239 lb (108.4 kg)     GEN:  Obese well developed in no acute distress HEENT: Normal NECK: No JVD; No carotid bruits LYMPHATICS: No lymphadenopathy CARDIAC: RRR, no murmurs, rubs, gallops RESPIRATORY:  Clear to auscultation without rales, wheezing or rhonchi  ABDOMEN: Soft, non-tender, non-distended MUSCULOSKELETAL:  No edema; No deformity  SKIN: Warm and dry NEUROLOGIC:  Alert and oriented x 3 resting termor bilateral hands PSYCHIATRIC:  Normal affect   ASSESSMENT:  1. LBBB (left bundle branch block)   2. Essential hypertension    PLAN:    In order of problems listed above:  Positive stress test Concern for CAD HLD Migraine Recent COVID 19 Admission - symptomatic  - anatomy: unclear but possible distal disease  - continue ASA 81 mg - starting metoprolol 12.5 mg PO BID and Imdur 15 mg PO daily - stop sumitriptan  Preoperative Risk Assessment LBBB Preoperative Risk Assessment - The Revised Cardiac Risk Index = 1=0.9%: low risk  of perioperative myocardial infarction, pulmonary edema, ventricular fibrillation, cardiac arrest, or complete heart block.  - Patient is now asymptomatic on GDMT for CAD given small perfusion defect discussed at length medical management vs LHC; we will attempt medical mgmt and see if she improves; if not will pursue LHC over hip surgery - Our service is available as needed in the peri-operative period.    Prior Malignancy with Cardiotoxic Chemotherapy/Radiation - LV Function including GLS or other Strain, 3D Eval, MAPSE, or CMR: Unknown - with high dose radiotherapy (?30 Gy) where the heart is in the treatment field  - Encouraged exercise on a regular basis and healthy dietary habits - post hip surgery, will evaluate lipids and readdress HTN, would recommend ambulatory BP monitoring  Morbid Obesity with prior FenPhen use  - No evidence of  significant pulmonary hypertension on echocardiogram   Essential Hypertension - ambulatory blood pressure slightly elevated, will continue ambulatory BP monitoring; gave education on how to perform ambulatory blood pressure monitoring including the frequency and technique; goal ambulatory blood pressure < 135/85 on average - continue home medications with the exception of meds above suggestive of subclavian stenosis - discussed diet (DASH/low sodium), and exercise/weight loss interventions    05/17/20 Video visit follow up unless new symptoms or abnormal test results warranting change in plan    Medication Adjustments/Labs and Tests Ordered: Current medicines are reviewed at length with the patient today.  Concerns regarding medicines are outlined above.  Orders Placed This Encounter  Procedures  . EKG 12-Lead   Meds ordered this encounter  Medications  . isosorbide mononitrate (IMDUR) 30 MG 24 hr tablet    Sig: Take 0.5 tablets (15 mg total) by mouth daily.    Dispense:  90 tablet    Refill:  1  . metoprolol tartrate (LOPRESSOR) 25 MG tablet    Sig: Take 0.5 tablets (12.5 mg total) by mouth 2 (two) times daily.    Dispense:  90 tablet    Refill:  3    Patient Instructions  Medication Instructions:  Your physician has recommended you make the following change in your medication:  STOP:  Sumatriptan (imitrex)  START: isosorbide mononitrate (Imdur) 15mg  daily   START: metoprolol 12.5mg  twice a day  *If you need a refill on your cardiac medications before your next appointment, please call your pharmacy*   Lab Work: NONE If you have labs (blood work) drawn today and your tests are completely normal, you will receive your results only by: Marland Kitchen MyChart Message (if you have MyChart) OR . A paper copy in the mail If you have any lab test that is abnormal or we need to change your treatment, we will call you to review the results.   Testing/Procedures: NONE   Follow-Up: At  [facility], you and your health needs are our priority.  As part of our continuing mission to provide you with exceptional heart care, we have created designated Provider Care Teams.  These Care Teams include  your primary Cardiologist (physician) and Advanced Practice Providers (APPs -  Physician Assistants and Nurse Practitioners) who all work together to provide you with the care you need, when you need it.     Your next appointment:  05/17/2020 at 8:20am    The format for your next appointment:  MyChart Video Visit             Signed, Werner Lean, MD  04/13/2020 2:27 PM    [place]

## 2020-04-13 NOTE — Patient Instructions (Signed)
Medication Instructions:  Your physician has recommended you make the following change in your medication:  STOP:  Sumatriptan (imitrex)  START: isosorbide mononitrate (Imdur) 15mg  daily   START: metoprolol 12.5mg  twice a day  *If you need a refill on your cardiac medications before your next appointment, please call your pharmacy*   Lab Work: NONE If you have labs (blood work) drawn today and your tests are completely normal, you will receive your results only by: Marland Kitchen MyChart Message (if you have MyChart) OR . A paper copy in the mail If you have any lab test that is abnormal or we need to change your treatment, we will call you to review the results.   Testing/Procedures: NONE   Follow-Up: At [facility], you and your health needs are our priority.  As part of our continuing mission to provide you with exceptional heart care, we have created designated Provider Care Teams.  These Care Teams include your primary Cardiologist (physician) and Advanced Practice Providers (APPs -  Physician Assistants and Nurse Practitioners) who all work together to provide you with the care you need, when you need it.     Your next appointment:  05/17/2020 at 8:20am    The format for your next appointment:  MyChart Video Visit

## 2020-04-19 NOTE — Telephone Encounter (Signed)
Chandrasekhar, Mahesh A, MD  You 18 minutes ago (11:29 AM)   [place]  Which medication is making her feel bad? I presume this is the Imdur which we can stop. If it is both medication, she could stop both though this does increase her overall risk post operatively. May benefit from earlier follow up as if she cannot tolerate medical management for CAD we could consider Cath.   Mahesh   Message text     Dr. Gasper Sells, I advised her to stop the Imdur first and continue her other regimen, for she is unsure which med is causing the problem.  I instructed her to call us or mychart [place] on this Friday or next Monday to report how her symptoms are, since stopping the imdur.  I told her if symptoms still persist with the stop of Imdur, then we will need to bring her into the office for a sooner follow-up visit, to discuss further management, or consider arranging a cath.  Just wanted to update you on this.

## 2020-05-02 NOTE — Telephone Encounter (Signed)
Discontinued metoprolol from the pts med list, as Dr. Gasper Sells advised.  Sent the pt a mychart message telling her that Dr. Gasper Sells wants her to come in to be seen this week, to discuss symptoms and to arrange her left heart cath.  Provided the pt some open appt dates and times for this week.  Triage to await pts response on what date and time works best for her, and schedule accordingly thereafter.

## 2020-05-04 ENCOUNTER — Ambulatory Visit: Payer: Medicare Other | Admitting: Internal Medicine

## 2020-05-04 ENCOUNTER — Encounter: Payer: Self-pay | Admitting: Internal Medicine

## 2020-05-04 ENCOUNTER — Other Ambulatory Visit: Payer: Self-pay

## 2020-05-04 VITALS — BP 110/70 | HR 92 | Ht 68.0 in | Wt 235.0 lb

## 2020-05-04 DIAGNOSIS — I10 Essential (primary) hypertension: Secondary | ICD-10-CM

## 2020-05-04 DIAGNOSIS — Z9189 Other specified personal risk factors, not elsewhere classified: Secondary | ICD-10-CM | POA: Diagnosis not present

## 2020-05-04 DIAGNOSIS — Z170 Estrogen receptor positive status [ER+]: Secondary | ICD-10-CM

## 2020-05-04 DIAGNOSIS — I447 Left bundle-branch block, unspecified: Secondary | ICD-10-CM

## 2020-05-04 DIAGNOSIS — R9439 Abnormal result of other cardiovascular function study: Secondary | ICD-10-CM | POA: Diagnosis not present

## 2020-05-04 DIAGNOSIS — C50211 Malignant neoplasm of upper-inner quadrant of right female breast: Secondary | ICD-10-CM | POA: Diagnosis not present

## 2020-05-04 DIAGNOSIS — Z9229 Personal history of other drug therapy: Secondary | ICD-10-CM | POA: Diagnosis not present

## 2020-05-04 DIAGNOSIS — E785 Hyperlipidemia, unspecified: Secondary | ICD-10-CM | POA: Diagnosis not present

## 2020-05-04 DIAGNOSIS — Z8616 Personal history of COVID-19: Secondary | ICD-10-CM

## 2020-05-04 NOTE — Patient Instructions (Signed)
Medication Instructions:  Your physician recommends that you continue on your current medications as directed. Please refer to the Current Medication list given to you today.  *If you need a refill on your cardiac medications before your next appointment, please call your pharmacy*   Lab Work: TODAY: CBC and BMET If you have labs (blood work) drawn today and your tests are completely normal, you will receive your results only by: Marland Kitchen MyChart Message (if you have MyChart) OR . A paper copy in the mail If you have any lab test that is abnormal or we need to change your treatment, we will call you to review the results.   Testing/Procedures: Your physician has requested that you have a cardiac catheterization. Cardiac catheterization is used to diagnose and/or treat various heart conditions. Doctors may recommend this procedure for a number of different reasons. The most common reason is to evaluate chest pain. Chest pain can be a symptom of coronary artery disease (CAD), and cardiac catheterization can show whether plaque is narrowing or blocking your heart's arteries. This procedure is also used to evaluate the valves, as well as measure the blood flow and oxygen levels in different parts of your heart. Please follow instruction sheet, as given.        COVID TEST--______TBD_____-- Dennis Bast will go to [place]. [address] [place], [place] [address]  for your Covid testing.   This is a drive thru test site.   Be sure to share with the first checkpoint that you are there for pre-procedure/surgery testing. Stay in your car and the nurse team will come to your car to test you.  After you are tested please go home and self-quarantine until the day of your procedure.   Please upload proof of positive COVID test to mychart.      Follow-Up: At [facility], you and your health needs are our priority.  As part of our continuing mission to provide you with exceptional heart care, we have created designated  Provider Care Teams.  These Care Teams include your primary Cardiologist (physician) and Advanced Practice Providers (APPs -  Physician Assistants and Nurse Practitioners) who all work together to provide you with the care you need, when you need it.  We recommend signing up for the patient portal called "MyChart".  Sign up information is provided on this After Visit Summary.  MyChart is used to connect with patients for Virtual Visits (Telemedicine).  Patients are able to view lab/test results, encounter notes, upcoming appointments, etc.  Non-urgent messages can be sent to your provider as well.   To learn more about what you can do with MyChart, go to [email or web address].    Your next appointment:   3 week(s)  The format for your next appointment:   In Person  Provider:   You may see Gasper Sells, MD or one of the following Advanced Practice Providers on your designated Care Team:    Melina Copa, PA-C  Ermalinda Barrios, PA-C    Other Instructions    [facility] OFFICE [place], [place] [address] Dept: [phone] Loc: [place]  05/04/2020  You are scheduled for a Cardiac Catheterization on Monday, March 7 with Dr. Sherren Mocha.  1. Please arrive at the [facility] (Main Entrance A) at [facility]: [address] [place], [place] [address] at 5:30 AM (This time is two hours before your procedure to ensure your preparation).  Free valet parking service is available.   Special note: Every effort is made to have your procedure done on time. Please understand that emergencies sometimes delay scheduled procedures.  2. Diet: Do not eat solid foods after midnight.  The patient may have clear liquids until 5am upon the day of the procedure.  3. Labs: You will need to have blood drawn TODAY  4. Medication instructions in preparation for your  procedure:   Contrast Allergy: No   On the morning of your procedure, take your Aspirin 81mg  and any morning medicines NOT listed above.  You may use sips of water.  5. Plan for one night stay--bring personal belongings. 6. Bring a current list of your medications and current insurance cards. 7. You MUST have a responsible person to drive you home. 8. Someone MUST be with you the first 24 hours after you arrive home or your discharge will be delayed. 9. Please wear clothes that are easy to get on and off and wear slip-on shoes.  Thank you for allowing [place] to care for you!   -- [facility]

## 2020-05-04 NOTE — Addendum Note (Signed)
Addended by: Precious Gilding on: 05/04/2020 03:31 PM   Modules accepted: Orders

## 2020-05-04 NOTE — Progress Notes (Signed)
Cardiology Office Note:    Date:  05/04/2020   ID:  Tina Ponce, DOB 08-03-1965, MRN [id number]  PCP:  Shirline Frees, MD  [facility] Cardiologist:  Rudean Haskell MD [place] Electrophysiologist:  None   CC: Follow up medical management after positive stress test  History of Present Illness:    Tina Ponce is a 55 y.o. female with a hx of R Breast Cancer, Known LBBB, Morbid Obesity with HTN, OSA on CPAP, and HLD; distant smoker who presents for evaluation 02/24/20 for evaluation of L Hip Surgery (Murphy/Wainer).  In interval had echo WNL, but evidence of a perfusion defect on NM Stress.  Started on ASA, had not received BB or and PRN nitroglycerin seen 04/13/20.  In interim of this visit, patient had intolerance to Nitrates and BB (felt week and sick with terrible headache).    We have been trying to stratify her for surgery of L Hip with Murphy/Wainer.  Oncological History notable for: Malignancies: ER Positive R Breast Cancer Surgery: Lumpectomy 2018 Chemotherapy: N/A Cessations for Toxicity: N/A Radiation: 50 Gy in target that could include heart Oncology care spearheaded by: Lindi Adie  Notes issues with metoprolol with dizziness, stomach pain, and lethargy.  Notes issues with Imdur felt comatose with terrible headache and dizziness.  Notes chest pressure and squeezing.  Notes being out of breath easily. No weight gain or leg swelling.  No palpitations or syncope.  Ambulatory blood pressure 100/43  On metoprolol; 120/70 off.   Past Medical History:  Diagnosis Date  . Anxiety   . Arthritis   . Cancer [facility])    dx 12/12/2016 with right breast cancer  . Depression   . Family history of breast cancer   . Genetic testing 12/26/2016   Breast/GYN panel (23 genes) @ [facility] - No pathogenic mutations detected  . GERD (gastroesophageal reflux disease)   . Headache   . History of radiation therapy 03/13/17- 04/09/17   Right Breast, 2.67 Gy  X 15 fractions, and  right breast boost 2 Gy X 5 fractions.   . Hyperlipidemia   . Hypertension   . Left bundle branch block   . Primary localized osteoarthritis of right hip 01/07/2017  . Sleep apnea    CPAP q night   . Unspecified vitamin D deficiency     Past Surgical History:  Procedure Laterality Date  . BREAST LUMPECTOMY WITH RADIOACTIVE SEED AND SENTINEL LYMPH NODE BIOPSY Right 02/03/2017   Procedure: BREAST LUMPECTOMY WITH RADIOACTIVE SEED AND SENTINEL LYMPH NODE BIOPSY WITH ADDITIONAL TISSUE MARGINS;  Surgeon: Jovita Kussmaul, MD;  Location: [place];  Service: General;  Laterality: Right;  . CESAREAN SECTION  11/03/1990   /w epidural   . DENTAL SURGERY     fillings and crown  . TOTAL HIP ARTHROPLASTY Right 01/07/2017   Procedure: TOTAL HIP ARTHROPLASTY;  Surgeon: Marchia Bond, MD;  Location: [place];  Service: Orthopedics;  Laterality: Right;    Current Medications: Current Meds  Medication Sig  . ALPRAZolam (XANAX) 0.5 MG tablet Take 0.5 mg by mouth 2 (two) times daily.  Marland Kitchen amitriptyline (ELAVIL) 25 MG tablet Take 25 mg by mouth at bedtime.  . Azelaic Acid 15 % cream Apply 1 application topically daily.  . baclofen (LIORESAL) 10 MG tablet Take 10 mg by mouth 3 (three) times daily.  . calcium carbonate (OS-CAL) 600 MG tablet Take 600 mg by mouth daily.  . Cholecalciferol (VITAMIN D3) 5000 units CAPS Take 5,000 Units by mouth daily.  Marland Kitchen  famotidine (PEPCID) 10 MG tablet Take 10 mg by mouth 2 (two) times daily.  . meloxicam (MOBIC) 15 MG tablet Take 15 mg by mouth daily.   . sertraline (ZOLOFT) 100 MG tablet Take 100 mg by mouth 2 (two) times daily.   . traMADol (ULTRAM) 50 MG tablet Take 50 mg by mouth every 6 (six) hours as needed for moderate pain.      Allergies:   Avelox [moxifloxacin hcl in nacl], Codeine, Moxifloxacin, Flagyl [metronidazole], Sulfamethoxazole-trimethoprim, and Latex   Social History   Socioeconomic History  . Marital status: Single    Spouse name: Not on file  . Number  of children: Not on file  . Years of education: Not on file  . Highest education level: Not on file  Occupational History  . Not on file  Tobacco Use  . Smoking status: Former Smoker    Quit date: 03/20/2005    Years since quitting: 15.1  . Smokeless tobacco: Never Used  Vaping Use  . Vaping Use: Never used  Substance and Sexual Activity  . Alcohol use: No  . Drug use: No  . Sexual activity: Not on file  Other Topics Concern  . Not on file  Social History Narrative  . Not on file   Social Determinants of Health   Financial Resource Strain: Not on file  Food Insecurity: Not on file  Transportation Needs: Not on file  Physical Activity: Not on file  Stress: Not on file  Social Connections: Not on file    Family History: The patient's family history includes Breast cancer in her maternal aunt; Breast cancer (age of onset: 67) in her sister; Breast cancer (age of onset: 30) in her sister; Cancer in her father and maternal uncle; Diabetes in her mother; Hypertension in her mother; Ovarian cancer in her maternal aunt; Prostate cancer (age of onset: 92) in her maternal grandfather.  ROS:   Please see the history of present illness.     All other systems reviewed and are negative.  EKGs/Labs/Other Studies Reviewed:    The following studies were reviewed today:  EKG:   05/04/20: SR 92 LBBB rare PVCs 04/13/20: SR rate 77 LBBB QTc 482 02/24/2020: SR LBBB QTc 480  Recent Labs: No results found for requested labs within last 8760 hours.  Recent Lipid Panel No results found for: CHOL, TRIG, HDL, CHOLHDL, VLDL, LDLCALC, LDLDIRECT  Risk Assessment/Calculations:    N/A  Physical Exam:    VS:  BP 110/70   Pulse 92   Ht 5\' 8"  (1.727 m)   Wt 235 lb (106.6 kg)   SpO2 97%   BMI 35.73 kg/m     Wt Readings from Last 3 Encounters:  05/04/20 235 lb (106.6 kg)  04/13/20 239 lb (108.4 kg)  02/28/20 239 lb (108.4 kg)    GEN:  Obese well developed in no acute distress HEENT:  Normal NECK: No JVD; No carotid bruits LYMPHATICS: No lymphadenopathy CARDIAC: RRR, no murmurs, rubs, gallops RESPIRATORY:  Clear to auscultation without rales, wheezing or rhonchi  ABDOMEN: Soft, non-tender, non-distended MUSCULOSKELETAL:  No edema; No deformity  SKIN: Warm and dry NEUROLOGIC:  Alert and oriented x 3; resting termor bilateral hands PSYCHIATRIC:  Normal affect   ASSESSMENT:    1. Positive cardiac stress test   2. LBBB (left bundle branch block)   3. Hyperlipidemia, unspecified hyperlipidemia type   4. Essential hypertension   5. Morbid obesity ([place])   6. Fen-phen exposure   7. Malignant neoplasm  of upper-inner quadrant of right breast in female, estrogen receptor positive ([place])   8. History of COVID-19    PLAN:    In order of problems listed above:  Positive stress test Concern for CAD LBBB HLD History of COVID-19 (January 12th 2022) - symptomatic  - anatomy: unclear but possible distal disease  - continue ASA 81 mg - has not tolerated BB or Nitrates - Planned for LHC with possible CFR (Abbot Wire) Risks and benefits of cardiac catheterization have been discussed with the patient.  These include bleeding, infection, kidney damage, stroke, heart attack, death.  The patient understands these risks and is willing to proceed.  Prior Malignancy with Cardiotoxic Chemotherapy/Radiation - LV Function including GLS or other Strain, 3D Eval, MAPSE, or CMR: Unknown - with high dose radiotherapy (?30 Gy) where the heart is in the treatment field  - Encouraged exercise on a regular basis and healthy dietary habits - post hip surgery, will evaluate lipids and readdress HTN, would recommend ambulatory BP monitoring  Morbid Obesity with prior FenPhen use  - No evidence of significant pulmonary hypertension on echocardiogram   Essential Hypertension - ambulatory blood pressure at goal, will continue ambulatory BP monitoring; gave education on how to perform ambulatory  blood pressure monitoring including the frequency and technique; goal ambulatory blood pressure < 135/85 on average - continue home medications  - discussed diet (DASH/low sodium), and exercise/weight loss interventions   Post Cath follow up unless new symptoms or abnormal test results warranting change in plan  Would be reasonable for  APP Follow up   Medication Adjustments/Labs and Tests Ordered: Current medicines are reviewed at length with the patient today.  Concerns regarding medicines are outlined above.  Orders Placed This Encounter  Procedures  . EKG 12-Lead   No orders of the defined types were placed in this encounter.   Patient Instructions  Medication Instructions:  Your physician recommends that you continue on your current medications as directed. Please refer to the Current Medication list given to you today.  *If you need a refill on your cardiac medications before your next appointment, please call your pharmacy*   Lab Work: TODAY: CBC and BMET If you have labs (blood work) drawn today and your tests are completely normal, you will receive your results only by: Marland Kitchen MyChart Message (if you have MyChart) OR . A paper copy in the mail If you have any lab test that is abnormal or we need to change your treatment, we will call you to review the results.   Testing/Procedures: Your physician has requested that you have a cardiac catheterization. Cardiac catheterization is used to diagnose and/or treat various heart conditions. Doctors may recommend this procedure for a number of different reasons. The most common reason is to evaluate chest pain. Chest pain can be a symptom of coronary artery disease (CAD), and cardiac catheterization can show whether plaque is narrowing or blocking your heart's arteries. This procedure is also used to evaluate the valves, as well as measure the blood flow and oxygen levels in different parts of your heart. Please follow instruction sheet,  as given.        COVID TEST--______TBD_____-- Dennis Bast will go to [facility]. [place], [place] [address]  for your Covid testing.   This is a drive thru test site.   Be sure to share with the first checkpoint that you are there for pre-procedure/surgery testing. Stay in your car and the nurse team will come to your car  to test you.  After you are tested please go home and self-quarantine until the day of your procedure.   Please upload proof of positive COVID test to mychart.      Follow-Up: At [facility], you and your health needs are our priority.  As part of our continuing mission to provide you with exceptional heart care, we have created designated Provider Care Teams.  These Care Teams include your primary Cardiologist (physician) and Advanced Practice Providers (APPs -  Physician Assistants and Nurse Practitioners) who all work together to provide you with the care you need, when you need it.  We recommend signing up for the patient portal called "MyChart".  Sign up information is provided on this After Visit Summary.  MyChart is used to connect with patients for Virtual Visits (Telemedicine).  Patients are able to view lab/test results, encounter notes, upcoming appointments, etc.  Non-urgent messages can be sent to your provider as well.   To learn more about what you can do with MyChart, go to [email or web address].    Your next appointment:   3 week(s)  The format for your next appointment:   In Person  Provider:   You may see Gasper Sells, MD or one of the following Advanced Practice Providers on your designated Care Team:    Melina Copa, PA-C  Ermalinda Barrios, PA-C    Other Instructions    [facility] OFFICE [place], [place] [address] Dept: [phone] Loc: [place]  05/04/2020  You are scheduled for a Cardiac Catheterization on Monday,  March 7 with Dr. Sherren Mocha.  1. Please arrive at the [facility] (Main Entrance A) at [facility]: [address] [place], [place] [address] at 5:30 AM (This time is two hours before your procedure to ensure your preparation). Free valet parking service is available.   Special note: Every effort is made to have your procedure done on time. Please understand that emergencies sometimes delay scheduled procedures.  2. Diet: Do not eat solid foods after midnight.  The patient may have clear liquids until 5am upon the day of the procedure.  3. Labs: You will need to have blood drawn TODAY  4. Medication instructions in preparation for your procedure:   Contrast Allergy: No   On the morning of your procedure, take your Aspirin 81mg  and any morning medicines NOT listed above.  You may use sips of water.  5. Plan for one night stay--bring personal belongings. 6. Bring a current list of your medications and current insurance cards. 7. You MUST have a responsible person to drive you home. 8. Someone MUST be with you the first 24 hours after you arrive home or your discharge will be delayed. 9. Please wear clothes that are easy to get on and off and wear slip-on shoes.  Thank you for allowing [place] to care for you!   -- [facility] Invasive Cardiovascular services      Signed, Werner Lean, MD  05/04/2020 3:22 PM    [place]

## 2020-05-04 NOTE — H&P (View-Only) (Signed)
Cardiology Office Note:    Date:  05/04/2020   ID:  Tina Ponce, DOB 05/06/1965, MRN [id number]  PCP:  Shirline Frees, MD  [facility] Cardiologist:  Rudean Haskell MD [place] Electrophysiologist:  None   CC: Follow up medical management after positive stress test  History of Present Illness:    Tina Ponce is a 55 y.o. female with a hx of R Breast Cancer, Known LBBB, Morbid Obesity with HTN, OSA on CPAP, and HLD; distant smoker who presents for evaluation 02/24/20 for evaluation of L Hip Surgery (Murphy/Wainer).  In interval had echo WNL, but evidence of a perfusion defect on NM Stress.  Started on ASA, had not received BB or and PRN nitroglycerin seen 04/13/20.  In interim of this visit, patient had intolerance to Nitrates and BB (felt week and sick with terrible headache).    We have been trying to stratify her for surgery of L Hip with Murphy/Wainer.  Oncological History notable for: Malignancies: ER Positive R Breast Cancer Surgery: Lumpectomy 2018 Chemotherapy: N/A Cessations for Toxicity: N/A Radiation: 50 Gy in target that could include heart Oncology care spearheaded by: Lindi Adie  Notes issues with metoprolol with dizziness, stomach pain, and lethargy.  Notes issues with Imdur felt comatose with terrible headache and dizziness.  Notes chest pressure and squeezing.  Notes being out of breath easily. No weight gain or leg swelling.  No palpitations or syncope.  Ambulatory blood pressure 100/43  On metoprolol; 120/70 off.   Past Medical History:  Diagnosis Date  . Anxiety   . Arthritis   . Cancer [facility])    dx 12/12/2016 with right breast cancer  . Depression   . Family history of breast cancer   . Genetic testing 12/26/2016   Breast/GYN panel (23 genes) @ [facility] - No pathogenic mutations detected  . GERD (gastroesophageal reflux disease)   . Headache   . History of radiation therapy 03/13/17- 04/09/17   Right Breast, 2.67 Gy  X 15 fractions, and  right breast boost 2 Gy X 5 fractions.   . Hyperlipidemia   . Hypertension   . Left bundle branch block   . Primary localized osteoarthritis of right hip 01/07/2017  . Sleep apnea    CPAP q night   . Unspecified vitamin D deficiency     Past Surgical History:  Procedure Laterality Date  . BREAST LUMPECTOMY WITH RADIOACTIVE SEED AND SENTINEL LYMPH NODE BIOPSY Right 02/03/2017   Procedure: BREAST LUMPECTOMY WITH RADIOACTIVE SEED AND SENTINEL LYMPH NODE BIOPSY WITH ADDITIONAL TISSUE MARGINS;  Surgeon: Jovita Kussmaul, MD;  Location: [place];  Service: General;  Laterality: Right;  . CESAREAN SECTION  11/03/1990   /w epidural   . DENTAL SURGERY     fillings and crown  . TOTAL HIP ARTHROPLASTY Right 01/07/2017   Procedure: TOTAL HIP ARTHROPLASTY;  Surgeon: Marchia Bond, MD;  Location: [place];  Service: Orthopedics;  Laterality: Right;    Current Medications: Current Meds  Medication Sig  . ALPRAZolam (XANAX) 0.5 MG tablet Take 0.5 mg by mouth 2 (two) times daily.  Marland Kitchen amitriptyline (ELAVIL) 25 MG tablet Take 25 mg by mouth at bedtime.  . Azelaic Acid 15 % cream Apply 1 application topically daily.  . baclofen (LIORESAL) 10 MG tablet Take 10 mg by mouth 3 (three) times daily.  . calcium carbonate (OS-CAL) 600 MG tablet Take 600 mg by mouth daily.  . Cholecalciferol (VITAMIN D3) 5000 units CAPS Take 5,000 Units by mouth daily.  Marland Kitchen  famotidine (PEPCID) 10 MG tablet Take 10 mg by mouth 2 (two) times daily.  . meloxicam (MOBIC) 15 MG tablet Take 15 mg by mouth daily.   . sertraline (ZOLOFT) 100 MG tablet Take 100 mg by mouth 2 (two) times daily.   . traMADol (ULTRAM) 50 MG tablet Take 50 mg by mouth every 6 (six) hours as needed for moderate pain.      Allergies:   Avelox [moxifloxacin hcl in nacl], Codeine, Moxifloxacin, Flagyl [metronidazole], Sulfamethoxazole-trimethoprim, and Latex   Social History   Socioeconomic History  . Marital status: Single    Spouse name: Not on file  . Number  of children: Not on file  . Years of education: Not on file  . Highest education level: Not on file  Occupational History  . Not on file  Tobacco Use  . Smoking status: Former Smoker    Quit date: 03/20/2005    Years since quitting: 15.1  . Smokeless tobacco: Never Used  Vaping Use  . Vaping Use: Never used  Substance and Sexual Activity  . Alcohol use: No  . Drug use: No  . Sexual activity: Not on file  Other Topics Concern  . Not on file  Social History Narrative  . Not on file   Social Determinants of Health   Financial Resource Strain: Not on file  Food Insecurity: Not on file  Transportation Needs: Not on file  Physical Activity: Not on file  Stress: Not on file  Social Connections: Not on file    Family History: The patient's family history includes Breast cancer in her maternal aunt; Breast cancer (age of onset: 29) in her sister; Breast cancer (age of onset: 73) in her sister; Cancer in her father and maternal uncle; Diabetes in her mother; Hypertension in her mother; Ovarian cancer in her maternal aunt; Prostate cancer (age of onset: 3) in her maternal grandfather.  ROS:   Please see the history of present illness.     All other systems reviewed and are negative.  EKGs/Labs/Other Studies Reviewed:    The following studies were reviewed today:  EKG:   05/04/20: SR 92 LBBB rare PVCs 04/13/20: SR rate 77 LBBB QTc 482 02/24/2020: SR LBBB QTc 480  Recent Labs: No results found for requested labs within last 8760 hours.  Recent Lipid Panel No results found for: CHOL, TRIG, HDL, CHOLHDL, VLDL, LDLCALC, LDLDIRECT  Risk Assessment/Calculations:    N/A  Physical Exam:    VS:  BP 110/70   Pulse 92   Ht 5\' 8"  (1.727 m)   Wt 235 lb (106.6 kg)   SpO2 97%   BMI 35.73 kg/m     Wt Readings from Last 3 Encounters:  05/04/20 235 lb (106.6 kg)  04/13/20 239 lb (108.4 kg)  02/28/20 239 lb (108.4 kg)    GEN:  Obese well developed in no acute distress HEENT:  Normal NECK: No JVD; No carotid bruits LYMPHATICS: No lymphadenopathy CARDIAC: RRR, no murmurs, rubs, gallops RESPIRATORY:  Clear to auscultation without rales, wheezing or rhonchi  ABDOMEN: Soft, non-tender, non-distended MUSCULOSKELETAL:  No edema; No deformity  SKIN: Warm and dry NEUROLOGIC:  Alert and oriented x 3; resting termor bilateral hands PSYCHIATRIC:  Normal affect   ASSESSMENT:    1. Positive cardiac stress test   2. LBBB (left bundle branch block)   3. Hyperlipidemia, unspecified hyperlipidemia type   4. Essential hypertension   5. Morbid obesity ([place])   6. Fen-phen exposure   7. Malignant neoplasm  of upper-inner quadrant of right breast in female, estrogen receptor positive ([employer])   8. History of COVID-19    PLAN:    In order of problems listed above:  Positive stress test Concern for CAD LBBB HLD History of COVID-19 (January 12th 2022) - symptomatic  - anatomy: unclear but possible distal disease  - continue ASA 81 mg - has not tolerated BB or Nitrates - Planned for LHC with possible CFR (A[place]ot Wire) Risks and benefits of cardiac catheterization have been discussed with the patient.  These include bleeding, infection, kidney damage, stroke, heart attack, death.  The patient understands these risks and is willing to proceed.  Prior Malignancy with Cardiotoxic Chemotherapy/Radiation - LV Function including GLS or other Strain, 3D Eval, MAPSE, or CMR: Unknown - with high dose radiotherapy (?30 Gy) where the heart is in the treatment field  - Encouraged exercise on a regular basis and healthy dietary habits - post hip surgery, will evaluate lipids and readdress HTN, would recommend ambulatory BP monitoring  Morbid Obesity with prior FenPhen use  - No evidence of significant pulmonary hypertension on echocardiogram   Essential Hypertension - ambulatory blood pressure at goal, will continue ambulatory BP monitoring; gave education on how to perform ambulatory  blood pressure monitoring including the frequency and technique; goal ambulatory blood pressure < 135/85 on average - continue home medications  - discussed diet (DASH/low sodium), and exercise/weight loss interventions   Post Cath follow up unless new symptoms or abnormal test results warranting change in plan  Would be reasonable for  APP Follow up   Medication Adjustments/Labs and Tests Ordered: Current medicines are reviewed at length with the patient today.  Concerns regarding medicines are outlined above.  Orders Placed This Encounter  Procedures  . EKG 12-Lead   No orders of the defined types were placed in this encounter.   Patient Instructions  Medication Instructions:  Your physician recommends that you continue on your current medications as directed. Please refer to the Current Medication list given to you today.  *If you need a refill on your cardiac medications before your next appointment, please call your pharmacy*   Lab Work: TODAY: CBC and BMET If you have labs (blood work) drawn today and your tests are completely normal, you will receive your results only by: Marland Kitchen MyChart Message (if you have MyChart) OR . A paper copy in the mail If you have any lab test that is abnormal or we need to change your treatment, we will call you to review the results.   Testing/Procedures: Your physician has requested that you have a cardiac catheterization. Cardiac catheterization is used to diagnose and/or treat various heart conditions. Doctors may recommend this procedure for a number of different reasons. The most common reason is to evaluate chest pain. Chest pain can be a symptom of coronary artery disease (CAD), and cardiac catheterization can show whether plaque is narrowing or blocking your heart's arteries. This procedure is also used to evaluate the valves, as well as measure the blood flow and oxygen levels in different parts of your heart. Please follow instruction sheet,  as given.        COVID TEST--______TBD_____-- Dennis Bast will go to [facility]. [place], [place] [address]  for your Covid testing.   This is a drive thru test site.   Be sure to share with the first checkpoint that you are there for pre-procedure/surgery testing. Stay in your car and the nurse team will come to your car  to test you.  After you are tested please go home and self-quarantine until the day of your procedure.   Please upload proof of positive COVID test to mychart.      Follow-Up: At [facility], you and your health needs are our priority.  As part of our continuing mission to provide you with exceptional heart care, we have created designated Provider Care Teams.  These Care Teams include your primary Cardiologist (physician) and Advanced Practice Providers (APPs -  Physician Assistants and Nurse Practitioners) who all work together to provide you with the care you need, when you need it.  We recommend signing up for the patient portal called "MyChart".  Sign up information is provided on this After Visit Summary.  MyChart is used to connect with patients for Virtual Visits (Telemedicine).  Patients are able to view lab/test results, encounter notes, upcoming appointments, etc.  Non-urgent messages can be sent to your provider as well.   To learn more about what you can do with MyChart, go to [email or web address].    Your next appointment:   3 week(s)  The format for your next appointment:   In Person  Provider:   You may see Gasper Sells, MD or one of the following Advanced Practice Providers on your designated Care Team:    Melina Copa, PA-C  Ermalinda Barrios, PA-C    Other Instructions    [place] OFFICE [place], [place] [address] Dept: [phone] Loc: [place]  05/04/2020  You are scheduled for a Cardiac Catheterization on Monday,  March 7 with Dr. Sherren Mocha.  1. Please arrive at the [facility] (Main Entrance A) at [facility]: [address] [place], [place] [address] at 5:30 AM (This time is two hours before your procedure to ensure your preparation). Free valet parking service is available.   Special note: Every effort is made to have your procedure done on time. Please understand that emergencies sometimes delay scheduled procedures.  2. Diet: Do not eat solid foods after midnight.  The patient may have clear liquids until 5am upon the day of the procedure.  3. Labs: You will need to have blood drawn TODAY  4. Medication instructions in preparation for your procedure:   Contrast Allergy: No   On the morning of your procedure, take your Aspirin 81mg  and any morning medicines NOT listed above.  You may use sips of water.  5. Plan for one night stay--bring personal belongings. 6. Bring a current list of your medications and current insurance cards. 7. You MUST have a responsible person to drive you home. 8. Someone MUST be with you the first 24 hours after you arrive home or your discharge will be delayed. 9. Please wear clothes that are easy to get on and off and wear slip-on shoes.  Thank you for allowing [place] to care for you!   -- [facility] Cardiovascular services      Signed, Werner Lean, MD  05/04/2020 3:22 PM    [place]

## 2020-05-05 LAB — CBC
Hematocrit: 41.9 % (ref 34.0–46.6)
Hemoglobin: 13.8 g/dL (ref 11.1–15.9)
MCH: 26.7 pg (ref 26.6–33.0)
MCHC: 32.9 g/dL (ref 31.5–35.7)
MCV: 81 fL (ref 79–97)
Platelets: 252 10*3/uL (ref 150–450)
RBC: 5.17 x10E6/uL (ref 3.77–5.28)
RDW: 15.6 % — ABNORMAL HIGH (ref 11.7–15.4)
WBC: 10.5 10*3/uL (ref 3.4–10.8)

## 2020-05-05 LAB — BASIC METABOLIC PANEL
BUN/Creatinine Ratio: 22 (ref 9–23)
BUN: 17 mg/dL (ref 6–24)
CO2: 23 mmol/L (ref 20–29)
Calcium: 10.1 mg/dL (ref 8.7–10.2)
Chloride: 97 mmol/L (ref 96–106)
Creatinine, Ser: 0.78 mg/dL (ref 0.57–1.00)
GFR calc Af Amer: 99 mL/min/{1.73_m2} (ref >59)
GFR calc non Af Amer: 86 mL/min/{1.73_m2} (ref >59)
Glucose: 93 mg/dL (ref 65–99)
Potassium: 5.2 mmol/L (ref 3.5–5.2)
Sodium: 137 mmol/L (ref 134–144)

## 2020-05-08 NOTE — Telephone Encounter (Signed)
-----   Message from Werner Lean, MD sent at 05/05/2020  4:39 PM EST ----- Results: Stable Plan: Safe for Cath  Werner Lean, MD

## 2020-05-08 NOTE — Telephone Encounter (Signed)
Patient has seen results via [place].  She has decided that she does not want to have a heart cath.  Dr. Gasper Sells is aware, she also is requesting to move her next appointment to later in March.  She is scheduled for 06/02/20 at [facility] as a mychart visit. Will notify patient via [place].

## 2020-05-15 ENCOUNTER — Encounter (HOSPITAL_COMMUNITY): Admission: RE | Payer: Self-pay | Source: Home / Self Care

## 2020-05-15 ENCOUNTER — Ambulatory Visit (HOSPITAL_COMMUNITY): Admission: RE | Admit: 2020-05-15 | Payer: Medicare Other | Source: Home / Self Care | Admitting: Cardiovascular Disease

## 2020-05-15 SURGERY — LEFT HEART CATH AND CORONARY ANGIOGRAPHY
Anesthesia: LOCAL

## 2020-05-17 ENCOUNTER — Telehealth: Payer: Medicare Other | Admitting: Internal Medicine

## 2020-05-19 NOTE — Progress Notes (Signed)
Called patient to go over heart cath and covid testing instructions. She verbalizes understanding.

## 2020-05-23 ENCOUNTER — Other Ambulatory Visit (HOSPITAL_COMMUNITY)
Admission: RE | Admit: 2020-05-23 | Discharge: 2020-05-23 | Disposition: A | Discharge status: Home / Self Care | Payer: Medicare Other | Source: Ambulatory Visit | Attending: Internal Medicine | Admitting: Internal Medicine

## 2020-05-23 DIAGNOSIS — Z20822 Contact with and (suspected) exposure to covid-19: Secondary | ICD-10-CM | POA: Insufficient documentation

## 2020-05-23 DIAGNOSIS — Z01812 Encounter for preprocedural laboratory examination: Secondary | ICD-10-CM | POA: Diagnosis not present

## 2020-05-23 LAB — SARS CORONAVIRUS 2 (TAT 6-24 HRS): SARS Coronavirus 2: NEGATIVE

## 2020-05-24 ENCOUNTER — Telehealth: Payer: Self-pay | Admitting: *Deleted

## 2020-05-24 NOTE — Telephone Encounter (Signed)
Pt contacted pre-catheterization scheduled at [facility] for: Thursday May 25, 2020 7:30 AM Verified arrival time and place: [facility]) at: 5:30 AM   No solid food after midnight prior to cath, clear liquids until 5 AM day of procedure.  AM meds can be  taken pre-cath with sips of water including: ASA 81 mg   Confirmed patient has responsible adult to drive home post procedure and be with patient first 24 hours after arriving home: yes  You are allowed ONE visitor in the waiting room during the time you are at the hospital for your procedure. Both you and your visitor must wear a mask once you enter the hospital.  Reviewed procedure/mask/visitor instructions with patient.

## 2020-05-24 NOTE — Telephone Encounter (Signed)
-----   Message from Precious Gilding, RN sent at 05/19/2020  3:22 PM EST ----- Regarding: Left Heart Cath 05/25/20 This patient is scheduled to have a left heart cath on Thursday May 25, 2020 at 7:30 with Dr. Saunders Revel.  Dx positive stress test.  Covid screen Tuesday May 23, 2020 at 1:35PM.  Labs completed during 05/04/20 OV.  Please precert.   Dr. Gasper Sells please place hospital orders.    Thanks  [employer]

## 2020-05-25 ENCOUNTER — Encounter (HOSPITAL_COMMUNITY)
Admission: RE | Disposition: A | Discharge status: Home / Self Care | Payer: Self-pay | Source: Home / Self Care | Attending: Internal Medicine

## 2020-05-25 ENCOUNTER — Ambulatory Visit (HOSPITAL_COMMUNITY)
Admission: RE | Admit: 2020-05-25 | Discharge: 2020-05-25 | Disposition: A | Discharge status: Home / Self Care | Payer: Medicare Other | Attending: Internal Medicine | Admitting: Internal Medicine

## 2020-05-25 ENCOUNTER — Encounter (HOSPITAL_COMMUNITY): Payer: Self-pay | Admitting: Internal Medicine

## 2020-05-25 DIAGNOSIS — Z853 Personal history of malignant neoplasm of breast: Secondary | ICD-10-CM | POA: Insufficient documentation

## 2020-05-25 DIAGNOSIS — Z8616 Personal history of COVID-19: Secondary | ICD-10-CM | POA: Diagnosis not present

## 2020-05-25 DIAGNOSIS — I10 Essential (primary) hypertension: Secondary | ICD-10-CM | POA: Diagnosis not present

## 2020-05-25 DIAGNOSIS — Z87891 Personal history of nicotine dependence: Secondary | ICD-10-CM | POA: Insufficient documentation

## 2020-05-25 DIAGNOSIS — I447 Left bundle-branch block, unspecified: Secondary | ICD-10-CM | POA: Insufficient documentation

## 2020-05-25 DIAGNOSIS — Z91040 Latex allergy status: Secondary | ICD-10-CM | POA: Insufficient documentation

## 2020-05-25 DIAGNOSIS — E785 Hyperlipidemia, unspecified: Secondary | ICD-10-CM | POA: Diagnosis not present

## 2020-05-25 DIAGNOSIS — I2510 Atherosclerotic heart disease of native coronary artery without angina pectoris: Secondary | ICD-10-CM | POA: Diagnosis not present

## 2020-05-25 DIAGNOSIS — R9439 Abnormal result of other cardiovascular function study: Secondary | ICD-10-CM | POA: Insufficient documentation

## 2020-05-25 DIAGNOSIS — Z882 Allergy status to sulfonamides status: Secondary | ICD-10-CM | POA: Insufficient documentation

## 2020-05-25 DIAGNOSIS — G4733 Obstructive sleep apnea (adult) (pediatric): Secondary | ICD-10-CM | POA: Diagnosis not present

## 2020-05-25 DIAGNOSIS — Z79899 Other long term (current) drug therapy: Secondary | ICD-10-CM | POA: Diagnosis not present

## 2020-05-25 DIAGNOSIS — Z9221 Personal history of antineoplastic chemotherapy: Secondary | ICD-10-CM | POA: Diagnosis not present

## 2020-05-25 DIAGNOSIS — R079 Chest pain, unspecified: Secondary | ICD-10-CM | POA: Insufficient documentation

## 2020-05-25 DIAGNOSIS — Z923 Personal history of irradiation: Secondary | ICD-10-CM | POA: Insufficient documentation

## 2020-05-25 DIAGNOSIS — Z6836 Body mass index (BMI) 36.0-36.9, adult: Secondary | ICD-10-CM | POA: Diagnosis not present

## 2020-05-25 DIAGNOSIS — Z885 Allergy status to narcotic agent status: Secondary | ICD-10-CM | POA: Diagnosis not present

## 2020-05-25 HISTORY — PX: INTRAVASCULAR PRESSURE WIRE/FFR STUDY: CATH118243

## 2020-05-25 HISTORY — PX: LEFT HEART CATH AND CORONARY ANGIOGRAPHY: CATH118249

## 2020-05-25 LAB — POCT ACTIVATED CLOTTING TIME: Activated Clotting Time: 261 seconds

## 2020-05-25 SURGERY — LEFT HEART CATH AND CORONARY ANGIOGRAPHY
Anesthesia: LOCAL

## 2020-05-25 MED ORDER — HEPARIN SODIUM (PORCINE) 1000 UNIT/ML IJ SOLN
INTRAMUSCULAR | Status: AC
  Filled 2020-05-25: qty 1

## 2020-05-25 MED ORDER — HEPARIN SODIUM (PORCINE) 1000 UNIT/ML IJ SOLN
INTRAMUSCULAR | Status: DC | PRN
  Administered 2020-05-25: 2000 [IU] via INTRAVENOUS
  Administered 2020-05-25: 6000 [IU] via INTRAVENOUS
  Administered 2020-05-25: 5000 [IU] via INTRAVENOUS

## 2020-05-25 MED ORDER — IOHEXOL 350 MG/ML SOLN
INTRAVENOUS | Status: DC | PRN
  Administered 2020-05-25: 80 mL

## 2020-05-25 MED ORDER — SODIUM CHLORIDE 0.9% FLUSH: 3.0000 mL | INTRAVENOUS | Status: DC | PRN

## 2020-05-25 MED ORDER — SODIUM CHLORIDE 0.9 % IV SOLN: 250.0000 mL | INTRAVENOUS | Status: DC | PRN

## 2020-05-25 MED ORDER — LABETALOL HCL 5 MG/ML IV SOLN: 10.0000 mg | INTRAVENOUS | Status: DC | PRN

## 2020-05-25 MED ORDER — LIDOCAINE HCL (PF) 1 % IJ SOLN
INTRAMUSCULAR | Status: AC
  Filled 2020-05-25: qty 30

## 2020-05-25 MED ORDER — SODIUM CHLORIDE 0.9% FLUSH: 3.0000 mL | Freq: Two times a day (BID) | INTRAVENOUS | Status: DC

## 2020-05-25 MED ORDER — HEPARIN (PORCINE) IN NACL 1000-0.9 UT/500ML-% IV SOLN
INTRAVENOUS | Status: AC
  Filled 2020-05-25: qty 1000

## 2020-05-25 MED ORDER — VERAPAMIL HCL 2.5 MG/ML IV SOLN
INTRAVENOUS | Status: DC | PRN
  Administered 2020-05-25: 10 mL via INTRA_ARTERIAL

## 2020-05-25 MED ORDER — HYDRALAZINE HCL 20 MG/ML IJ SOLN: 10.0000 mg | INTRAMUSCULAR | Status: DC | PRN

## 2020-05-25 MED ORDER — ADENOSINE 12 MG/4ML IV SOLN
INTRAVENOUS | Status: AC
  Filled 2020-05-25: qty 16

## 2020-05-25 MED ORDER — VERAPAMIL HCL 2.5 MG/ML IV SOLN
INTRAVENOUS | Status: AC
  Filled 2020-05-25: qty 2

## 2020-05-25 MED ORDER — ADENOSINE (DIAGNOSTIC) 140MCG/KG/MIN
INTRAVENOUS | Status: DC | PRN
  Administered 2020-05-25: 140 ug/kg/min via INTRAVENOUS

## 2020-05-25 MED ORDER — SODIUM CHLORIDE 0.9 % IV SOLN: INTRAVENOUS | Status: AC

## 2020-05-25 MED ORDER — FENTANYL CITRATE (PF) 100 MCG/2ML IJ SOLN
INTRAMUSCULAR | Status: AC
  Filled 2020-05-25: qty 2

## 2020-05-25 MED ORDER — ACETAMINOPHEN 325 MG PO TABS: 650.0000 mg | ORAL_TABLET | ORAL | Status: DC | PRN

## 2020-05-25 MED ORDER — LIDOCAINE HCL (PF) 1 % IJ SOLN
INTRAMUSCULAR | Status: DC | PRN
  Administered 2020-05-25: 2 mL

## 2020-05-25 MED ORDER — SODIUM CHLORIDE 0.9 % WEIGHT BASED INFUSION: 1.0000 mL/kg/h | INTRAVENOUS | Status: DC

## 2020-05-25 MED ORDER — HEPARIN (PORCINE) IN NACL 1000-0.9 UT/500ML-% IV SOLN
INTRAVENOUS | Status: DC | PRN
  Administered 2020-05-25 (×2): 500 mL

## 2020-05-25 MED ORDER — MIDAZOLAM HCL 2 MG/2ML IJ SOLN
INTRAMUSCULAR | Status: DC | PRN
  Administered 2020-05-25 (×2): 1 mg via INTRAVENOUS

## 2020-05-25 MED ORDER — MIDAZOLAM HCL 2 MG/2ML IJ SOLN
INTRAMUSCULAR | Status: AC
  Filled 2020-05-25: qty 2

## 2020-05-25 MED ORDER — FENTANYL CITRATE (PF) 100 MCG/2ML IJ SOLN
INTRAMUSCULAR | Status: DC | PRN
  Administered 2020-05-25 (×2): 25 ug via INTRAVENOUS

## 2020-05-25 MED ORDER — ASPIRIN 81 MG PO CHEW: 81.0000 mg | CHEWABLE_TABLET | ORAL | Status: AC

## 2020-05-25 MED ORDER — SODIUM CHLORIDE 0.9 % WEIGHT BASED INFUSION
3.0000 mL/kg/h | INTRAVENOUS | Status: AC
  Administered 2020-05-25: 3 mL/kg/h via INTRAVENOUS

## 2020-05-25 MED ORDER — ONDANSETRON HCL 4 MG/2ML IJ SOLN: 4.0000 mg | Freq: Four times a day (QID) | INTRAMUSCULAR | Status: DC | PRN

## 2020-05-25 SURGICAL SUPPLY — 12 items
CATH INFINITI 5FR MULTPACK ANG (CATHETERS) ×1 IMPLANT
CATH VISTA GUIDE 6FR XBLAD3.5 (CATHETERS) ×1 IMPLANT
DEVICE RAD COMP TR BAND LRG (VASCULAR PRODUCTS) ×1 IMPLANT
GLIDESHEATH SLEND SS 6F .021 (SHEATH) ×1 IMPLANT
GUIDEWIRE INQWIRE 1.5J.035X260 (WIRE) IMPLANT
GUIDEWIRE PRESSURE X 175 (WIRE) ×1 IMPLANT
INQWIRE 1.5J .035X260CM (WIRE) ×2
KIT ESSENTIALS PG (KITS) ×1 IMPLANT
KIT HEART LEFT (KITS) ×2 IMPLANT
PACK CARDIAC CATHETERIZATION (CUSTOM PROCEDURE TRAY) ×2 IMPLANT
TRANSDUCER W/STOPCOCK (MISCELLANEOUS) ×2 IMPLANT
TUBING CIL FLEX 10 FLL-RA (TUBING) ×2 IMPLANT

## 2020-05-25 NOTE — Research (Signed)
IDENTIFY Informed Consent   Subject Name: NYAH SHEPHERD  Subject met inclusion and exclusion criteria.  The informed consent form, study requirements and expectations were reviewed with the subject and questions and concerns were addressed prior to the signing of the consent form.  The subject verbalized understanding of the trail requirements.  The subject agreed to participate in the IDENTIFY trial and signed the informed consent.  The informed consent was obtained prior to performance of any protocol-specific procedures for the subject.  A copy of the signed informed consent was given to the subject and a copy was placed in the subject's medical record.  Philemon Kingdom D 05/25/2020, [phone]

## 2020-05-25 NOTE — Discharge Instructions (Signed)
Radial Site Care  This sheet gives you information about how to care for yourself after your procedure. Your health care provider may also give you more specific instructions. If you have problems or questions, contact your health care provider. What can I expect after the procedure? After the procedure, it is common to have:  Bruising and tenderness at the catheter insertion area. Follow these instructions at home: Medicines  Take over-the-counter and prescription medicines only as told by your health care provider. Insertion site care 1. Follow instructions from your health care provider about how to take care of your insertion site. Make sure you: ? Wash your hands with soap and water before you remove your bandage (dressing). If soap and water are not available, use hand sanitizer. ? May remove dressing in 24 hours. 2. Check your insertion site every day for signs of infection. Check for: ? Redness, swelling, or pain. ? Fluid or blood. ? Pus or a bad smell. ? Warmth. 3. Do no take baths, swim, or use a hot tub for 5 days. 4. You may shower 24-48 hours after the procedure. ? Remove the dressing and gently wash the site with plain soap and water. ? Pat the area dry with a clean towel. ? Do not rub the site. That could cause bleeding. 5. Do not apply powder or lotion to the site. Activity  1. For 24 hours after the procedure, or as directed by your health care provider: ? Do not flex or bend the affected arm. ? Do not push or pull heavy objects with the affected arm. ? Do not drive yourself home from the hospital or clinic. You may drive 24 hours after the procedure. ? Do not operate machinery or power tools. ? KEEP ARM ELEVATED THE REMAINDER OF THE DAY. 2. Do not push, pull or lift anything that is heavier than 10 lb for 5 days. 3. Ask your health care provider when it is okay to: ? Return to work or school. ? Resume usual physical activities or sports. ? Resume sexual  activity. General instructions  If the catheter site starts to bleed, raise your arm and put firm pressure on the site. If the bleeding does not stop, get help right away. This is a medical emergency.  DRINK PLENTY OF FLUIDS FOR THE NEXT 2-3 DAYS.  No alcohol consumption for 24 hours after receiving sedation.  If you went home on the same day as your procedure, a responsible adult should be with you for the first 24 hours after you arrive home.  Keep all follow-up visits as told by your health care provider. This is important. Contact a health care provider if:  You have a fever.  You have redness, swelling, or yellow drainage around your insertion site. Get help right away if:  You have unusual pain at the radial site.  The catheter insertion area swells very fast.  The insertion area is bleeding, and the bleeding does not stop when you hold steady pressure on the area.  Your arm or hand becomes pale, cool, tingly, or numb. These symptoms may represent a serious problem that is an emergency. Do not wait to see if the symptoms will go away. Get medical help right away. Call your local emergency services (911 in the [place] Do not drive yourself to the hospital. Summary  After the procedure, it is common to have bruising and tenderness at the site.  Follow instructions from your health care provider about how to take care   of your radial site wound. Check the wound every day for signs of infection.  This information is not intended to replace advice given to you by your health care provider. Make sure you discuss any questions you have with your health care provider. Document Revised: 04/02/2017 Document Reviewed: 04/02/2017 Elsevier Patient Education  2020 [employer]vier Inc. 

## 2020-05-25 NOTE — Interval H&P Note (Signed)
History and Physical Interval Note:  05/25/2020 7:22 AM  Tina Ponce  has presented today for surgery, with the diagnosis of chest pain  and positive stress test.  The various methods of treatment have been discussed with the patient and family. After consideration of risks, benefits and other options for treatment, the patient has consented to  Procedure(s): LEFT HEART CATH AND CORONARY ANGIOGRAPHY (N/A) as a surgical intervention.  The patient's history has been reviewed, patient examined, no change in status, stable for surgery.  I have reviewed the patient's chart and labs.  Questions were answered to the patient's satisfaction.    Cath Lab Visit (complete for each Cath Lab visit)  Clinical Evaluation Leading to the Procedure:   ACS: No.  Non-ACS:    Anginal Classification: CCS IV  Anti-ischemic medical therapy: No Therapy  Non-Invasive Test Results: Intermediate-risk stress test findings: cardiac mortality 1-3%/year  Prior CABG: No previous CABG  Drayk Humbarger

## 2020-05-30 ENCOUNTER — Ambulatory Visit: Payer: Medicare Other | Admitting: Internal Medicine

## 2020-06-02 ENCOUNTER — Telehealth: Payer: Medicare Other | Admitting: Internal Medicine

## 2020-06-02 ENCOUNTER — Telehealth: Payer: Self-pay | Admitting: *Deleted

## 2020-06-02 NOTE — Telephone Encounter (Signed)
   [facility] Pre-operative Risk Assessment    HEARTCARE STAFF: - Please ensure there is not already an duplicate clearance open for this procedure. - Under Visit Info/Reason for Call, type in Other and utilize the format Clearance MM/DD/YY or Clearance TBD. Do not use dashes or single digits. - If request is for dental extraction, please clarify the # of teeth to be extracted.  Request for surgical clearance:  1. What type of surgery is being performed?  LEFT TOTAL HIP REPLACEMENT   2. When is this surgery scheduled?  TBD   3. What type of clearance is required (medical clearance vs. Pharmacy clearance to hold med vs. Both)?  MEDICAL  4. Are there any medications that need to be held prior to surgery and how long? N/A   5. Practice name and name of physician performing surgery?  [facility] / DR. LANDAU   6. What is the office phone number?  [phone]   7.   What is the office fax number?  [phone] ATTN: SHERRI  8.   Anesthesia type (None, local, MAC, general) ?     Jeanann Lewandowsky 06/02/2020, 1:29 PM  _________________________________________________________________   (provider comments below)

## 2020-06-05 NOTE — Telephone Encounter (Signed)
   Primary Cardiologist: No primary care provider on file.  Chart reviewed as part of pre-operative protocol coverage. Given past medical history and time since last visit, based on ACC/AHA guidelines, Symphany D Bobo would be at acceptable risk for the planned procedure. Due to some concerning symptoms, she underwent LHC 05/25/20 with no evidence of coronary disease, therefore there would be no contraindication precluding her elective hip surgery from a CV standpoint.     I will route this recommendation to the requesting party via Epic fax function and remove from pre-op pool.  Please call with questions.  Kathyrn Drown, NP 06/05/2020, 9:01 AM

## 2020-06-07 ENCOUNTER — Ambulatory Visit: Payer: Medicare Other | Admitting: Physician Assistant

## 2020-08-11 DIAGNOSIS — M1612 Unilateral primary osteoarthritis, left hip: Secondary | ICD-10-CM | POA: Diagnosis present

## 2020-08-18 NOTE — Patient Instructions (Addendum)
DUE TO COVID-19 ONLY ONE VISITOR IS ALLOWED TO COME WITH YOU AND STAY IN THE WAITING ROOM ONLY DURING PRE OP AND PROCEDURE DAY OF SURGERY. THE 1 VISITOR  MAY VISIT WITH YOU AFTER SURGERY IN YOUR PRIVATE ROOM DURING VISITING HOURS ONLY!  YOU NEED TO HAVE A COVID 19 TEST ON: 08/25/20 @ 2:00 PM , THIS TEST MUST BE DONE BEFORE SURGERY,  COVID TESTING SITE [place] [address], IT IS ON THE RIGHT GOING OUT [address] APPROXIMATELY  2 MINUTES PAST [employer] ON THE RIGHT. ONCE YOUR COVID TEST IS COMPLETED,  PLEASE BEGIN THE QUARANTINE INSTRUCTIONS AS OUTLINED IN YOUR HANDOUT.                Tina Ponce   Your procedure is scheduled on: 08/29/20   Report to [facility] Main  Entrance   Report to short stay at : 5:15 AM     Call this number if you have problems the morning of surgery [phone]    Remember:  NO SOLID FOOD AFTER MIDNIGHT THE NIGHT PRIOR TO SURGERY. NOTHING BY MOUTH EXCEPT CLEAR LIQUIDS UNTIL: 4:30 AM . PLEASE FINISH ENSURE DRINK PER SURGEON ORDER  WHICH NEEDS TO BE COMPLETED AT : 4:30 AM.   CLEAR LIQUID DIET  Foods Allowed                                                                     Foods Excluded  Coffee and tea, regular and decaf                             liquids that you cannot  Plain Jell-O any favor except red or purple                                           see through such as: Fruit ices (not with fruit pulp)                                     milk, soups, orange juice  Iced Popsicles                                    All solid food Carbonated beverages, regular and diet                                    Cranberry, grape and apple juices Sports drinks like Gatorade Lightly seasoned clear broth or consume(fat free) Sugar, honey syrup  Sample Menu Breakfast                                Lunch  Supper Cranberry juice                    Beef broth                             Chicken broth Jell-O                                     Grape juice                           Apple juice Coffee or tea                        Jell-O                                      Popsicle                                                Coffee or tea                        Coffee or tea  _____________________________________________________________________    BRUSH YOUR TEETH MORNING OF SURGERY AND RINSE YOUR MOUTH OUT, NO CHEWING GUM CANDY OR MINTS.    Take these medicines the morning of surgery with A SIP OF WATER: sertraline,famotidine.                               You may not have any metal on your body including hair pins and              piercings  Do not wear jewelry, make-up, lotions, powders or perfumes, deodorant             Do not wear nail polish on your fingernails.  Do not shave  48 hours prior to surgery.    Do not bring valuables to the hospital. [place].  Contacts, dentures or bridgework may not be worn into surgery.  Leave suitcase in the car. After surgery it may be brought to your room.     Patients discharged the day of surgery will not be allowed to drive home. IF YOU ARE HAVING SURGERY AND GOING HOME THE SAME DAY, YOU MUST HAVE AN ADULT TO DRIVE YOU HOME AND BE WITH YOU FOR 24 HOURS. YOU MAY GO HOME BY TAXI OR UBER OR ORTHERWISE, BUT AN ADULT MUST ACCOMPANY YOU HOME AND STAY WITH YOU FOR 24 HOURS.  Name and phone number of your driver:  Special Instructions: N/A              Please read over the following fact sheets you were given: _____________________________________________________________________  PLEASE BRING CPAP MASK [place]. DEVICE WILL BE PROVIDED!            [facility] - Preparing for Surgery Before surgery, you can play an important role.  Because skin  is not sterile, your skin needs to be as free of germs as possible.  You can reduce the number of germs on your skin by washing  with CHG (chlorahexidine gluconate) soap before surgery.  CHG is an antiseptic cleaner which kills germs and bonds with the skin to continue killing germs even after washing. Please DO NOT use if you have an allergy to CHG or antibacterial soaps.  If your skin becomes reddened/irritated stop using the CHG and inform your nurse when you arrive at Short Stay. Do not shave (including legs and underarms) for at least 48 hours prior to the first CHG shower.  You may shave your face/neck. Please follow these instructions carefully:  1.  Shower with CHG Soap the night before surgery and the  morning of Surgery.  2.  If you choose to wash your hair, wash your hair first as usual with your  normal  shampoo.  3.  After you shampoo, rinse your hair and body thoroughly to remove the  shampoo.                           4.  Use CHG as you would any other liquid soap.  You can apply chg directly  to the skin and wash                       Gently with a scrungie or clean washcloth.  5.  Apply the CHG Soap to your body ONLY FROM THE NECK DOWN.   Do not use on face/ open                           Wound or open sores. Avoid contact with eyes, ears mouth and genitals (private parts).                       Wash face,  Genitals (private parts) with your normal soap.             6.  Wash thoroughly, paying special attention to the area where your surgery  will be performed.  7.  Thoroughly rinse your body with warm water from the neck down.  8.  DO NOT shower/wash with your normal soap after using and rinsing off  the CHG Soap.                9.  Pat yourself dry with a clean towel.            10.  Wear clean pajamas.            11.  Place clean sheets on your bed the night of your first shower and do not  sleep with pets. Day of Surgery : Do not apply any lotions/deodorants the morning of surgery.  Please wear clean clothes to the hospital/surgery center.  FAILURE TO FOLLOW THESE INSTRUCTIONS MAY RESULT IN THE  CANCELLATION OF YOUR SURGERY PATIENT SIGNATURE_________________________________  NURSE SIGNATURE__________________________________  ________________________________________________________________________   Adam Phenix  An incentive spirometer is a tool that can help keep your lungs clear and active. This tool measures how well you are filling your lungs with each breath. Taking long deep breaths may help reverse or decrease the chance of developing breathing (pulmonary) problems (especially infection) following: A long period of time when you are unable to move or be active. BEFORE THE PROCEDURE  If the spirometer includes  an indicator to show your best effort, your nurse or respiratory therapist will set it to a desired goal. If possible, sit up straight or lean slightly forward. Try not to slouch. Hold the incentive spirometer in an upright position. INSTRUCTIONS FOR USE  Sit on the edge of your bed if possible, or sit up as far as you can in bed or on a chair. Hold the incentive spirometer in an upright position. Breathe out normally. Place the mouthpiece in your mouth and seal your lips tightly around it. Breathe in slowly and as deeply as possible, raising the piston or the ball toward the top of the column. Hold your breath for 3-5 seconds or for as long as possible. Allow the piston or ball to fall to the bottom of the column. Remove the mouthpiece from your mouth and breathe out normally. Rest for a few seconds and repeat Steps 1 through 7 at least 10 times every 1-2 hours when you are awake. Take your time and take a few normal breaths between deep breaths. The spirometer may include an indicator to show your best effort. Use the indicator as a goal to work toward during each repetition. After each set of 10 deep breaths, practice coughing to be sure your lungs are clear. If you have an incision (the cut made at the time of surgery), support your incision when coughing by  placing a pillow or rolled up towels firmly against it. Once you are able to get out of bed, walk around indoors and cough well. You may stop using the incentive spirometer when instructed by your caregiver.  RISKS AND COMPLICATIONS Take your time so you do not get dizzy or light-headed. If you are in pain, you may need to take or ask for pain medication before doing incentive spirometry. It is harder to take a deep breath if you are having pain. AFTER USE Rest and breathe slowly and easily. It can be helpful to keep track of a log of your progress. Your caregiver can provide you with a simple table to help with this. If you are using the spirometer at home, follow these instructions: [place] IF:  You are having difficultly using the spirometer. You have trouble using the spirometer as often as instructed. Your pain medication is not giving enough relief while using the spirometer. You develop fever of 100.5 F (38.1 C) or higher. SEEK IMMEDIATE MEDICAL CARE IF:  You cough up bloody sputum that had not been present before. You develop fever of 102 F (38.9 C) or greater. You develop worsening pain at or near the incision site. MAKE SURE YOU:  Understand these instructions. Will watch your condition. Will get help right away if you are not doing well or get worse. Document Released: 07/08/2006 Document Revised: 05/20/2011 Document Reviewed: 09/08/2006 [facility] Patient Information 2014 [place], [place].   ________________________________________________________________________

## 2020-08-19 ENCOUNTER — Emergency Department (HOSPITAL_COMMUNITY): Payer: Medicare Other

## 2020-08-19 ENCOUNTER — Emergency Department (HOSPITAL_COMMUNITY)
Admission: EM | Admit: 2020-08-19 | Discharge: 2020-08-19 | Disposition: A | Discharge status: Home / Self Care | Payer: Medicare Other | Attending: Emergency Medicine | Admitting: Emergency Medicine

## 2020-08-19 ENCOUNTER — Encounter (HOSPITAL_COMMUNITY): Payer: Self-pay

## 2020-08-19 ENCOUNTER — Other Ambulatory Visit: Payer: Self-pay

## 2020-08-19 DIAGNOSIS — W010XXA Fall on same level from slipping, tripping and stumbling without subsequent striking against object, initial encounter: Secondary | ICD-10-CM | POA: Insufficient documentation

## 2020-08-19 DIAGNOSIS — M25422 Effusion, left elbow: Secondary | ICD-10-CM | POA: Diagnosis not present

## 2020-08-19 DIAGNOSIS — I10 Essential (primary) hypertension: Secondary | ICD-10-CM | POA: Insufficient documentation

## 2020-08-19 DIAGNOSIS — M25522 Pain in left elbow: Secondary | ICD-10-CM | POA: Diagnosis not present

## 2020-08-19 DIAGNOSIS — R6889 Other general symptoms and signs: Secondary | ICD-10-CM | POA: Diagnosis not present

## 2020-08-19 DIAGNOSIS — Z743 Need for continuous supervision: Secondary | ICD-10-CM | POA: Diagnosis not present

## 2020-08-19 DIAGNOSIS — R0902 Hypoxemia: Secondary | ICD-10-CM | POA: Diagnosis not present

## 2020-08-19 DIAGNOSIS — M549 Dorsalgia, unspecified: Secondary | ICD-10-CM | POA: Diagnosis not present

## 2020-08-19 DIAGNOSIS — Z8616 Personal history of COVID-19: Secondary | ICD-10-CM | POA: Insufficient documentation

## 2020-08-19 DIAGNOSIS — M79632 Pain in left forearm: Secondary | ICD-10-CM | POA: Insufficient documentation

## 2020-08-19 DIAGNOSIS — Z91040 Latex allergy status: Secondary | ICD-10-CM | POA: Insufficient documentation

## 2020-08-19 DIAGNOSIS — W19XXXA Unspecified fall, initial encounter: Secondary | ICD-10-CM | POA: Diagnosis not present

## 2020-08-19 DIAGNOSIS — Z96641 Presence of right artificial hip joint: Secondary | ICD-10-CM | POA: Insufficient documentation

## 2020-08-19 DIAGNOSIS — Z853 Personal history of malignant neoplasm of breast: Secondary | ICD-10-CM | POA: Insufficient documentation

## 2020-08-19 DIAGNOSIS — Z87891 Personal history of nicotine dependence: Secondary | ICD-10-CM | POA: Insufficient documentation

## 2020-08-19 DIAGNOSIS — R000 Tachycardia, unspecified: Secondary | ICD-10-CM | POA: Diagnosis not present

## 2020-08-19 DIAGNOSIS — M79642 Pain in left hand: Secondary | ICD-10-CM | POA: Diagnosis not present

## 2020-08-19 DIAGNOSIS — Y9289 Other specified places as the place of occurrence of the external cause: Secondary | ICD-10-CM | POA: Diagnosis not present

## 2020-08-19 MED ORDER — HYDROMORPHONE HCL 1 MG/ML IJ SOLN
1.0000 mg | Freq: Once | INTRAMUSCULAR | Status: AC
  Administered 2020-08-19: 1 mg via INTRAVENOUS
  Filled 2020-08-19: qty 1

## 2020-08-19 MED ORDER — OXYCODONE-ACETAMINOPHEN 5-325 MG PO TABS: 1.0000 | ORAL_TABLET | Freq: Three times a day (TID) | ORAL | 0 refills | Status: DC | PRN

## 2020-08-19 MED ORDER — HYDROMORPHONE HCL 1 MG/ML IJ SOLN
0.5000 mg | Freq: Once | INTRAMUSCULAR | Status: AC
Start: 2020-08-19 — End: 2020-08-19
  Administered 2020-08-19: 0.5 mg via INTRAVENOUS
  Filled 2020-08-19: qty 1

## 2020-08-19 NOTE — ED Triage Notes (Signed)
Pt from home BIB EMS for a fall. Pt tripped and fell about 3 feet, landing on her left arm. Left arm deformity present.Pt unsure if she hit her head. Denies blood thinner use. C-collar in place on arrival. 275mcg fentanyl IV given PTA.

## 2020-08-19 NOTE — ED Provider Notes (Signed)
[facility] EMERGENCY DEPARTMENT Provider Note   CSN: [id number] Arrival date & time: 08/19/20  1532     History Chief Complaint  Patient presents with   Tina Ponce is a 55 y.o. female.   Fall Pertinent negatives include no chest pain and no abdominal pain. Patient presents after mechanical fall.  Tripped and fell about the left feet onto an outstretched left hand.  Complaining of pain in the left elbow and forearm.  States it hurts to move the hand.  No shoulder injury.  Not hit head.  States she hurts all over somewhat but her joints always hurt and is not different now.  Not on anticoagulation.     Past Medical History:  Diagnosis Date   Anxiety    Arthritis    Cancer ([place])    dx 12/12/2016 with right breast cancer   Depression    Family history of breast cancer    Genetic testing 12/26/2016   Breast/GYN panel (23 genes) @ [facility] - No pathogenic mutations detected   GERD (gastroesophageal reflux disease)    Headache    History of radiation therapy 03/13/17- 04/09/17   Right Breast, 2.67 Gy  X 15 fractions, and right breast boost 2 Gy X 5 fractions.    Hyperlipidemia    Hypertension    Left bundle branch block    Primary localized osteoarthritis of right hip 01/07/2017   Sleep apnea    CPAP q night    Unspecified vitamin D deficiency     Patient Active Problem List   Diagnosis Date Noted   Osteoarthritis of left hip 08/11/2020   Chest pain of uncertain etiology 38/88/2800   Positive cardiac stress test 05/04/2020   History of COVID-19 05/04/2020   Preoperative clearance 02/24/2020   LBBB (left bundle branch block) 02/24/2020   Morbid obesity ([place]) 02/24/2020   Essential hypertension 02/24/2020   Hyperlipidemia 02/24/2020   Fen-phen exposure 02/24/2020   Primary localized osteoarthritis of right hip 01/07/2017   Genetic testing 12/26/2016   Family history of breast cancer    Malignant neoplasm of upper-inner quadrant of right  breast in female, estrogen receptor positive ([place]) 12/19/2016    Past Surgical History:  Procedure Laterality Date   BREAST LUMPECTOMY WITH RADIOACTIVE SEED AND SENTINEL LYMPH NODE BIOPSY Right 02/03/2017   Procedure: BREAST LUMPECTOMY WITH RADIOACTIVE SEED AND SENTINEL LYMPH NODE BIOPSY WITH ADDITIONAL TISSUE MARGINS;  Surgeon: Jovita Kussmaul, MD;  Location: [place];  Service: General;  Laterality: Right;   CESAREAN SECTION  11/03/1990   /w epidural    DENTAL SURGERY     fillings and crown   INTRAVASCULAR PRESSURE WIRE/FFR STUDY N/A 05/25/2020   Procedure: INTRAVASCULAR PRESSURE WIRE/FFR STUDY;  Surgeon: Nelva Bush, MD;  Location: [facility];  Service: Cardiovascular;  Laterality: N/A;   LEFT HEART CATH AND CORONARY ANGIOGRAPHY N/A 05/25/2020   Procedure: LEFT HEART CATH AND CORONARY ANGIOGRAPHY;  Surgeon: Nelva Bush, MD;  Location: [facility];  Service: Cardiovascular;  Laterality: N/A;   TOTAL HIP ARTHROPLASTY Right 01/07/2017   Procedure: TOTAL HIP ARTHROPLASTY;  Surgeon: Marchia Bond, MD;  Location: [facility];  Service: Orthopedics;  Laterality: Right;     OB History   No obstetric history on file.     Family History  Problem Relation Age of Onset   Hypertension Mother    Diabetes Mother    Cancer Father        lung; possibly  prostate   Breast cancer Sister 80       negative genetic testing @ [facility]   Prostate cancer Maternal Grandfather 24   Breast cancer Maternal Aunt        Dx 64s; possibly also colon cancer   Cancer Maternal Uncle        one with lung; one with oral   Breast cancer Sister 78       negative genetic testing @ [facility]   Ovarian cancer Maternal Aunt        Dx 72s    Social History   Tobacco Use   Smoking status: Former    Pack years: 0.00    Types: Cigarettes    Quit date: 03/20/2005    Years since quitting: 15.4   Smokeless tobacco: Never  Vaping Use   Vaping Use: Never used  Substance Use Topics   Alcohol use: No    Drug use: No    Home Medications Prior to Admission medications   Medication Sig Start Date End Date Taking? Authorizing Provider  ALPRAZolam Duanne Moron) 0.5 MG tablet Take 0.5 mg by mouth 2 (two) times daily.   Yes [provider]  amitriptyline (ELAVIL) 25 MG tablet Take 25 mg by mouth at bedtime.   Yes [provider]  Azelaic Acid 15 % cream Apply 1 application topically daily as needed (rosacea). 05/30/17  Yes [provider]  baclofen (LIORESAL) 10 MG tablet Take 10 mg by mouth at bedtime.   Yes [provider]  Cholecalciferol (VITAMIN D3) 50 MCG (2000 UT) TABS Take 2,000 Units by mouth in the morning and at bedtime.   Yes [provider]  famotidine (PEPCID) 20 MG tablet Take 20 mg by mouth as needed for heartburn or indigestion.   Yes [provider]  meloxicam (MOBIC) 15 MG tablet Take 15 mg by mouth daily. 01/23/17  Yes [provider]  oxyCODONE-acetaminophen (PERCOCET/ROXICET) 5-325 MG tablet Take 1-2 tablets by mouth every 8 (eight) hours as needed for severe pain. 08/19/20  Yes Davonna Belling, MD  sertraline (ZOLOFT) 100 MG tablet Take 100 mg by mouth 2 (two) times daily.    Yes [provider]  SUMAtriptan (IMITREX) 100 MG tablet Take 100 mg by mouth every 2 (two) hours as needed for migraine. May repeat in 2 hours if headache persists or recurs.   Yes [provider]  traMADol (ULTRAM) 50 MG tablet Take 50 mg by mouth daily.   Yes [provider]    Allergies    Avelox [moxifloxacin hcl in nacl], Codeine, Moxifloxacin, Flagyl [metronidazole], Sulfamethoxazole-trimethoprim, and Latex  Review of Systems   Review of Systems  Constitutional:  Negative for appetite change.  Cardiovascular:  Negative for chest pain.  Gastrointestinal:  Negative for abdominal pain.  Musculoskeletal:        Chronic joint pain.  Skin:  Negative for wound.  Neurological:  Negative for weakness and numbness.   Psychiatric/Behavioral:  Negative for confusion.    Physical Exam Updated Vital Signs BP (!) 169/97   Pulse 71   Temp 97.7 F (36.5 C)   Resp 16   SpO2 97%   Physical Exam Vitals and nursing note reviewed.  Constitutional:      Appearance: Normal appearance.  HENT:     Head: Atraumatic.     Right Ear: External ear normal.     Left Ear: External ear normal.  Eyes:     Pupils: Pupils are equal, round, and reactive to light.  Cardiovascular:     Rate and Rhythm: Regular rhythm.  Chest:     Chest wall: No tenderness.  Abdominal:     Tenderness: There is no abdominal tenderness.  Musculoskeletal:        General: Tenderness present.     Cervical back: Neck supple. No tenderness.     Comments: Left forearm in splint.  Tenderness over hand.  Tenderness over forearm somewhat diffusely but tenderness over elbow.  No tenderness over shoulder.  Neurovascular intact in hand.  Strong radial pulse.  Skin intact.  No specific tenderness over wrist.  Skin:    Capillary Refill: Capillary refill takes less than 2 seconds.  Neurological:     Mental Status: She is alert and oriented to person, place, and time.    ED Results / Procedures / Treatments   Labs (all labs ordered are listed, but only abnormal results are displayed) Labs Reviewed - No data to display  EKG None  Radiology DG Elbow Complete Left  Result Date: 08/19/2020 CLINICAL DATA:  [place].  Left elbow pain. EXAM: LEFT ELBOW - COMPLETE 3+ VIEW COMPARISON:  None FINDINGS: The joint spaces are maintained. No acute fracture is identified. There is a small elbow joint effusion noted. IMPRESSION: Elbow joint effusion without definite acute fracture. Electronically Signed   By: Marijo Sanes M.D.   On: 08/19/2020 18:49   DG Forearm Left  Result Date: 08/19/2020 CLINICAL DATA:  Distal forearm pain following a fall. EXAM: LEFT FOREARM - 2 VIEW COMPARISON:  None. FINDINGS: There is no evidence of fracture or other focal bone lesions.  Soft tissues are unremarkable. IMPRESSION: Normal examination. Electronically Signed   By: Claudie Revering M.D.   On: 08/19/2020 17:10   DG Hand Complete Left  Result Date: 08/19/2020 CLINICAL DATA:  Left hand pain following a fall. EXAM: LEFT HAND - COMPLETE 3+ VIEW COMPARISON:  None. FINDINGS: There is no evidence of fracture or dislocation. There is no evidence of arthropathy or other focal bone abnormality. Soft tissues are unremarkable. IMPRESSION: Normal examination. Electronically Signed   By: Claudie Revering M.D.   On: 08/19/2020 17:09    Procedures Procedures   Medications Ordered in ED Medications  HYDROmorphone (DILAUDID) injection 1 mg (1 mg Intravenous Given 08/19/20 1644)  HYDROmorphone (DILAUDID) injection 0.5 mg (0.5 mg Intravenous Given 08/19/20 1926)    ED Course  I have reviewed the triage vital signs and the nursing notes.  Pertinent labs & imaging results that were available during my care of the patient were reviewed by me and considered in my medical decision making (see chart for details).    MDM Rules/Calculators/A&P                          Patient with mechanical fall.  Left forearm/elbow pain.  Initial x-rays reassuring.  X-ray of the elbow then added specifically and showed joint effusion with an anterior sail sign.  No clear fracture seen however.  We will treat with sling and pain medicines.  Outpatient follow-up with Dr. Mardelle Matte.  She is due to have her left hip replaced and actually has follow-up in 2 days in the office.  No other apparent injury. Final Clinical Impression(s) / ED Diagnoses Final diagnoses:  Fall, initial encounter  Elbow effusion, left    Rx / DC Orders ED Discharge Orders          Ordered    oxyCODONE-acetaminophen (PERCOCET/ROXICET) 5-325 MG tablet  Every 8  hours PRN        08/19/20 1911             Davonna Belling, MD 08/19/20 1927

## 2020-08-19 NOTE — Progress Notes (Signed)
Orthopedic Tech Progress Note Patient Details:  Tina Ponce 1966-01-09 [id number]  Ortho Devices Type of Ortho Device: Arm sling Ortho Device/Splint Location: LUE Ortho Device/Splint Interventions: Application, Ordered, Adjustment   Post Interventions Patient Tolerated: Well  [name] [name] 08/19/2020, 7:06 PM

## 2020-08-19 NOTE — Discharge Instructions (Addendum)
No fracture was seen in her x-ray but there is some fluid in the elbow joint.  Follow-up on Monday as planned with your orthopedic surgeon and they can tell you more of the neck steps.  Take the pain medicine as needed to help.

## 2020-08-19 NOTE — ED Notes (Signed)
Ortho tech called to apply shoulder immobilizer.

## 2020-08-21 ENCOUNTER — Other Ambulatory Visit: Payer: Self-pay | Admitting: Orthopedic Surgery

## 2020-08-21 ENCOUNTER — Encounter (HOSPITAL_COMMUNITY)
Admission: RE | Admit: 2020-08-21 | Discharge: 2020-08-21 | Disposition: A | Discharge status: Home / Self Care | Payer: Medicare Other | Source: Ambulatory Visit | Attending: Orthopedic Surgery | Admitting: Orthopedic Surgery

## 2020-08-21 ENCOUNTER — Other Ambulatory Visit: Payer: Self-pay

## 2020-08-21 ENCOUNTER — Encounter (HOSPITAL_COMMUNITY): Payer: Self-pay

## 2020-08-21 DIAGNOSIS — Z01812 Encounter for preprocedural laboratory examination: Secondary | ICD-10-CM | POA: Insufficient documentation

## 2020-08-21 DIAGNOSIS — S52125A Nondisplaced fracture of head of left radius, initial encounter for closed fracture: Secondary | ICD-10-CM | POA: Diagnosis not present

## 2020-08-21 DIAGNOSIS — M1612 Unilateral primary osteoarthritis, left hip: Secondary | ICD-10-CM | POA: Diagnosis not present

## 2020-08-21 DIAGNOSIS — M25522 Pain in left elbow: Secondary | ICD-10-CM

## 2020-08-21 DIAGNOSIS — M545 Low back pain, unspecified: Secondary | ICD-10-CM | POA: Diagnosis not present

## 2020-08-21 LAB — SURGICAL PCR SCREEN
MRSA, PCR: NEGATIVE
Staphylococcus aureus: NEGATIVE

## 2020-08-21 NOTE — Progress Notes (Signed)
COVID Vaccine Completed: Yes Date COVID Vaccine completed: 06/2019 COVID vaccine manufacturer: Pfizer    Moderna   [employer]   PCP - Dr. Shirline Frees. Cardiologist - Dr. Sherren Mocha.Clearance: Nunzio Cobbs: NP: 06/02/20: EPIC  Chest x-ray -  EKG - 05/26/20 Stress Test -  ECHO - 02/24/20 Cardiac Cath - 05/25/20 Pacemaker/ICD device last checked:  Sleep Study - Yes CPAP - Yes  Fasting Blood Sugar -  Checks Blood Sugar _____ times a day  Blood Thinner Instructions: Aspirin Instructions: Last Dose:  Anesthesia review: Hx: HTN,Lt. BBB,OSA(CPAP)  Patient denies shortness of breath, fever, cough and chest pain at PAT appointment   Patient verbalized understanding of instructions that were given to them at the PAT appointment. Patient was also instructed that they will need to review over the PAT instructions again at home before surgery.

## 2020-08-25 ENCOUNTER — Other Ambulatory Visit (HOSPITAL_COMMUNITY): Payer: Medicare Other

## 2020-08-29 ENCOUNTER — Encounter (HOSPITAL_COMMUNITY): Admission: RE | Payer: Self-pay | Source: Ambulatory Visit

## 2020-08-29 ENCOUNTER — Ambulatory Visit
Admission: RE | Admit: 2020-08-29 | Discharge: 2020-08-29 | Disposition: A | Discharge status: Home / Self Care | Payer: Medicare Other | Source: Ambulatory Visit | Attending: Orthopedic Surgery | Admitting: Orthopedic Surgery

## 2020-08-29 ENCOUNTER — Ambulatory Visit (HOSPITAL_COMMUNITY): Admission: RE | Admit: 2020-08-29 | Payer: Medicare Other | Source: Ambulatory Visit | Admitting: Orthopedic Surgery

## 2020-08-29 DIAGNOSIS — M25522 Pain in left elbow: Secondary | ICD-10-CM

## 2020-08-29 DIAGNOSIS — M25422 Effusion, left elbow: Secondary | ICD-10-CM | POA: Diagnosis not present

## 2020-08-29 DIAGNOSIS — M1612 Unilateral primary osteoarthritis, left hip: Secondary | ICD-10-CM | POA: Diagnosis present

## 2020-08-29 DIAGNOSIS — M7989 Other specified soft tissue disorders: Secondary | ICD-10-CM | POA: Diagnosis not present

## 2020-08-29 DIAGNOSIS — S52122A Displaced fracture of head of left radius, initial encounter for closed fracture: Secondary | ICD-10-CM | POA: Diagnosis not present

## 2020-08-29 SURGERY — ARTHROPLASTY, HIP, TOTAL,POSTERIOR APPROACH
Anesthesia: Choice | Site: Hip | Laterality: Left

## 2020-09-07 DIAGNOSIS — S52125A Nondisplaced fracture of head of left radius, initial encounter for closed fracture: Secondary | ICD-10-CM | POA: Diagnosis not present

## 2020-09-18 DIAGNOSIS — S52125D Nondisplaced fracture of head of left radius, subsequent encounter for closed fracture with routine healing: Secondary | ICD-10-CM | POA: Diagnosis not present

## 2020-09-20 DIAGNOSIS — M1612 Unilateral primary osteoarthritis, left hip: Secondary | ICD-10-CM | POA: Diagnosis not present

## 2020-10-09 DIAGNOSIS — S52125D Nondisplaced fracture of head of left radius, subsequent encounter for closed fracture with routine healing: Secondary | ICD-10-CM | POA: Diagnosis not present

## 2020-11-14 ENCOUNTER — Telehealth: Payer: Self-pay | Admitting: *Deleted

## 2020-11-14 NOTE — Telephone Encounter (Signed)
   [facility] Pre-operative Risk Assessment    Patient Name: Tina Ponce  DOB: 1965/03/18 MRN: [id number]  HEARTCARE STAFF:  - IMPORTANT!!!!!! Under Visit Info/Reason for Call, type in Other and utilize the format Clearance MM/DD/YY or Clearance TBD. Do not use dashes or single digits. - Please review there is not already an duplicate clearance open for this procedure. - If request is for dental extraction, please clarify the # of teeth to be extracted. - If the patient is currently at the dentist's office, call Pre-Op Callback Staff (MA/nurse) to input urgent request.  - If the patient is not currently in the dentist office, please route to the Pre-Op pool.  Request for surgical clearance:  What type of surgery is being performed? LEFT TOTAL HIP REPLACEMENT  When is this surgery scheduled? TBD  What type of clearance is required (medical clearance vs. Pharmacy clearance to hold med vs. Both)? MEDICAL  Are there any medications that need to be held prior to surgery and how long?  NONE LISTED   Practice name and name of physician performing surgery? [facility]; DR. Marchia Bond  What is the office phone number? [phone]   7.   What is the office fax number? [place].   Anesthesia type (None, local, MAC, general) ? CHOICE   Julaine Hua 11/14/2020, 5:04 PM  _________________________________________________________________   (provider comments below)

## 2020-11-15 NOTE — Telephone Encounter (Signed)
Phone answered, then hung up. I called back and mailbox is full.   Previously cleared for hip surgery. Normal coronaries in March 2022.

## 2020-11-16 DIAGNOSIS — R7309 Other abnormal glucose: Secondary | ICD-10-CM | POA: Diagnosis not present

## 2020-11-16 DIAGNOSIS — R7303 Prediabetes: Secondary | ICD-10-CM | POA: Diagnosis not present

## 2020-11-16 DIAGNOSIS — G43009 Migraine without aura, not intractable, without status migrainosus: Secondary | ICD-10-CM | POA: Diagnosis not present

## 2020-11-16 DIAGNOSIS — K219 Gastro-esophageal reflux disease without esophagitis: Secondary | ICD-10-CM | POA: Diagnosis not present

## 2020-11-16 DIAGNOSIS — E7800 Pure hypercholesterolemia, unspecified: Secondary | ICD-10-CM | POA: Diagnosis not present

## 2020-11-16 DIAGNOSIS — I10 Essential (primary) hypertension: Secondary | ICD-10-CM | POA: Diagnosis not present

## 2020-11-28 NOTE — Telephone Encounter (Signed)
   Name: Tina Ponce  DOB: Oct 28, 1965  MRN: [id number]   Primary Cardiologist: Dr. Gasper Sells  Chart reviewed as part of pre-operative protocol coverage. Patient was contacted 11/28/2020 in reference to pre-operative risk assessment for pending surgery as outlined below.  Tina Ponce was last seen in March 2022. At that time she was evaluated by Dr. Gasper Sells for pre-op clearance. Echo showed EF 50-55%, grade 1 DD, normal RV. She had an abnormal stress test prompting cardiac cath which showed no evidence of significant coronary artery disease. At that time, Dr. Gasper Sells gave clearance to proceed with hip surgery as requested. She did not go on to have the surgery yet, so the clearance is being re-requested. I reached out to patient for update on how she is doing. The patient affirms she has been doing well without any new cardiac symptoms since that time. Therefore, based on ACC/AHA guidelines, the patient would be at acceptable risk for the planned procedure without further cardiovascular testing. The patient was advised that if she develops new symptoms prior to surgery to contact our office to arrange for a follow-up visit, and she verbalized understanding.  I will route this recommendation to the requesting party via Epic fax function and remove from pre-op pool. Please call with questions.  Charlie Pitter, PA-C 11/28/2020, 10:41 AM

## 2020-12-19 NOTE — Progress Notes (Signed)
Sent message, via epic in basket, requesting orders in epic from surgeon.  

## 2020-12-26 NOTE — Patient Instructions (Signed)
DUE TO COVID-19 ONLY ONE VISITOR IS ALLOWED TO COME WITH YOU AND STAY IN THE WAITING ROOM ONLY DURING PRE OP AND PROCEDURE.   **NO VISITORS ARE ALLOWED IN THE SHORT STAY AREA OR RECOVERY ROOM!!**  IF YOU WILL BE ADMITTED INTO THE HOSPITAL YOU ARE ALLOWED ONLY TWO SUPPORT PEOPLE DURING VISITATION HOURS ONLY (10AM -8PM)   The support person(s) may change daily. The support person(s) must pass our screening, gel in and out, and wear a mask at all times, including in the patient's room. Patients must also wear a mask when staff or their support person are in the room.  No visitors under the age of 22. Any visitor under the age of 75 must be accompanied by an adult.        Your procedure is scheduled on: 01/09/21   Report to [facility] Main Entrance   Report to Short Stay at 5:15 AM   [facility])   Call this number if you have problems the morning of surgery [phone]   Do not eat food :After Midnight.   May have liquids until: 4:30 AM    day of surgery  CLEAR LIQUID DIET  Foods Allowed                                                                     Foods Excluded  Water, Black Coffee and tea, regular and decaf                             liquids that you cannot  Plain Jell-O in any flavor  (No red)                                           see through such as: Fruit ices (not with fruit pulp)                                     milk, soups, orange juice              Iced Popsicles (No red)                                    All solid food                                   Apple juices Sports drinks like Gatorade (No red) Lightly seasoned clear broth or consume(fat free) Sugar,   Sample Menu Breakfast                                Lunch                                     Supper Cranberry juice  Beef broth                            Chicken broth Jell-O                                     Grape juice                           Apple juice Coffee  or tea                        Jell-O                                      Popsicle                                                Coffee or tea                        Coffee or tea      Complete one Ensure drink the morning of surgery at : 4:30 AM      the day of surgery.   The day of surgery:  Drink ONE (1) Pre-Surgery Clear Ensure or G2 by am the morning of surgery. Drink in one sitting. Do not sip.  This drink was given to you during your hospital  pre-op appointment visit. Nothing else to drink after completing the  Pre-Surgery Clear Ensure or G2.          If you have questions, please contact your surgeon's office.     Oral Hygiene is also important to reduce your risk of infection.                                    Remember - BRUSH YOUR TEETH THE MORNING OF SURGERY WITH YOUR REGULAR TOOTHPASTE   Do NOT smoke after Midnight   Take these medicines the morning of surgery with A SIP OF WATER: alprazolam,sertraline,famotidine.Sumatriptan as needed.  DO NOT TAKE ANY ORAL DIABETIC MEDICATIONS DAY OF YOUR SURGERY                              You may not have any metal on your body including hair pins, jewelry, and body piercing             Do not wear make-up, lotions, powders, perfumes/cologne, or deodorant  Do not wear nail polish including gel and S&S, artificial/acrylic nails, or any other type of covering on natural nails including finger and toenails. If you have artificial nails, gel coating, etc. that needs to be removed by a nail salon please have this removed prior to surgery or surgery may need to be canceled/ delayed if the surgeon/ anesthesia feels like they are unable to be safely monitored.   Do not shave  48 hours prior to surgery.    Do not bring valuables to the hospital. [facility] IS NOT  RESPONSIBLE   FOR VALUABLES.   Contacts, dentures or bridgework may not be worn into surgery.   Bring small overnight bag day of surgery.    Patients discharged  on the day of surgery will not be allowed to drive home.   Special Instructions: Bring a copy of your healthcare power of attorney and living will documents         the day of surgery if you haven't scanned them before.              Please read over the following fact sheets you were given: IF YOU HAVE QUESTIONS ABOUT YOUR PRE-OP INSTRUCTIONS PLEASE CALL [phone]   [facility] - Preparing for Surgery Before surgery, you can play an important role.  Because skin is not sterile, your skin needs to be as free of germs as possible.  You can reduce the number of germs on your skin by washing with CHG (chlorahexidine gluconate) soap before surgery.  CHG is an antiseptic cleaner which kills germs and bonds with the skin to continue killing germs even after washing. Please DO NOT use if you have an allergy to CHG or antibacterial soaps.  If your skin becomes reddened/irritated stop using the CHG and inform your nurse when you arrive at Short Stay. Do not shave (including legs and underarms) for at least 48 hours prior to the first CHG shower.  You may shave your face/neck. Please follow these instructions carefully:  1.  Shower with CHG Soap the night before surgery and the  morning of Surgery.  2.  If you choose to wash your hair, wash your hair first as usual with your  normal  shampoo.  3.  After you shampoo, rinse your hair and body thoroughly to remove the  shampoo.                           4.  Use CHG as you would any other liquid soap.  You can apply chg directly  to the skin and wash                       Gently with a scrungie or clean washcloth.  5.  Apply the CHG Soap to your body ONLY FROM THE NECK DOWN.   Do not use on face/ open                           Wound or open sores. Avoid contact with eyes, ears mouth and genitals (private parts).                       Wash face,  Genitals (private parts) with your normal soap.             6.  Wash thoroughly, paying special attention to the  area where your surgery  will be performed.  7.  Thoroughly rinse your body with warm water from the neck down.  8.  DO NOT shower/wash with your normal soap after using and rinsing off  the CHG Soap.                9.  Pat yourself dry with a clean towel.            10.  Wear clean pajamas.            11.  Place clean sheets on your  bed the night of your first shower and do not  sleep with pets. Day of Surgery : Do not apply any lotions/deodorants the morning of surgery.  Please wear clean clothes to the hospital/surgery center.  FAILURE TO FOLLOW THESE INSTRUCTIONS MAY RESULT IN THE CANCELLATION OF YOUR SURGERY PATIENT SIGNATURE_________________________________  NURSE SIGNATURE__________________________________  ________________________________________________________________________   Adam Phenix  An incentive spirometer is a tool that can help keep your lungs clear and active. This tool measures how well you are filling your lungs with each breath. Taking long deep breaths may help reverse or decrease the chance of developing breathing (pulmonary) problems (especially infection) following: A long period of time when you are unable to move or be active. BEFORE THE PROCEDURE  If the spirometer includes an indicator to show your best effort, your nurse or respiratory therapist will set it to a desired goal. If possible, sit up straight or lean slightly forward. Try not to slouch. Hold the incentive spirometer in an upright position. INSTRUCTIONS FOR USE  Sit on the edge of your bed if possible, or sit up as far as you can in bed or on a chair. Hold the incentive spirometer in an upright position. Breathe out normally. Place the mouthpiece in your mouth and seal your lips tightly around it. Breathe in slowly and as deeply as possible, raising the piston or the ball toward the top of the column. Hold your breath for 3-5 seconds or for as long as possible. Allow the piston or  ball to fall to the bottom of the column. Remove the mouthpiece from your mouth and breathe out normally. Rest for a few seconds and repeat Steps 1 through 7 at least 10 times every 1-2 hours when you are awake. Take your time and take a few normal breaths between deep breaths. The spirometer may include an indicator to show your best effort. Use the indicator as a goal to work toward during each repetition. After each set of 10 deep breaths, practice coughing to be sure your lungs are clear. If you have an incision (the cut made at the time of surgery), support your incision when coughing by placing a pillow or rolled up towels firmly against it. Once you are able to get out of bed, walk around indoors and cough well. You may stop using the incentive spirometer when instructed by your caregiver.  RISKS AND COMPLICATIONS Take your time so you do not get dizzy or light-headed. If you are in pain, you may need to take or ask for pain medication before doing incentive spirometry. It is harder to take a deep breath if you are having pain. AFTER USE Rest and breathe slowly and easily. It can be helpful to keep track of a log of your progress. Your caregiver can provide you with a simple table to help with this. If you are using the spirometer at home, follow these instructions: [place] IF:  You are having difficultly using the spirometer. You have trouble using the spirometer as often as instructed. Your pain medication is not giving enough relief while using the spirometer. You develop fever of 100.5 F (38.1 C) or higher. SEEK IMMEDIATE MEDICAL CARE IF:  You cough up bloody sputum that had not been present before. You develop fever of 102 F (38.9 C) or greater. You develop worsening pain at or near the incision site. MAKE SURE YOU:  Understand these instructions. Will watch your condition. Will get help right away if you are not  doing well or get worse. Document Released:  07/08/2006 Document Revised: 05/20/2011 Document Reviewed: 09/08/2006 [facility] Patient Information 2014 [place], [place].   ________________________________________________________________________

## 2020-12-27 ENCOUNTER — Encounter (HOSPITAL_COMMUNITY): Payer: Self-pay

## 2020-12-27 ENCOUNTER — Encounter (HOSPITAL_COMMUNITY)
Admission: RE | Admit: 2020-12-27 | Discharge: 2020-12-27 | Disposition: A | Discharge status: Home / Self Care | Payer: Medicare Other | Source: Ambulatory Visit | Attending: Orthopedic Surgery | Admitting: Orthopedic Surgery

## 2020-12-27 ENCOUNTER — Other Ambulatory Visit: Payer: Self-pay

## 2020-12-27 DIAGNOSIS — Z01812 Encounter for preprocedural laboratory examination: Secondary | ICD-10-CM | POA: Diagnosis not present

## 2020-12-27 DIAGNOSIS — I447 Left bundle-branch block, unspecified: Secondary | ICD-10-CM | POA: Diagnosis not present

## 2020-12-27 DIAGNOSIS — M1612 Unilateral primary osteoarthritis, left hip: Secondary | ICD-10-CM | POA: Diagnosis not present

## 2020-12-27 DIAGNOSIS — I10 Essential (primary) hypertension: Secondary | ICD-10-CM | POA: Diagnosis not present

## 2020-12-27 DIAGNOSIS — G4733 Obstructive sleep apnea (adult) (pediatric): Secondary | ICD-10-CM | POA: Insufficient documentation

## 2020-12-27 LAB — BASIC METABOLIC PANEL
Anion gap: 7 (ref 5–15)
BUN: 14 mg/dL (ref 6–20)
CO2: 28 mmol/L (ref 22–32)
Calcium: 9.5 mg/dL (ref 8.9–10.3)
Chloride: 104 mmol/L (ref 98–111)
Creatinine, Ser: 0.99 mg/dL (ref 0.44–1.00)
GFR, Estimated: >60 mL/min (ref >60)
Glucose, Bld: 96 mg/dL (ref 70–99)
Potassium: 3.9 mmol/L (ref 3.5–5.1)
Sodium: 139 mmol/L (ref 135–145)

## 2020-12-27 LAB — CBC
HCT: 39.7 % (ref 36.0–46.0)
Hemoglobin: 13 g/dL (ref 12.0–15.0)
MCH: 27 pg (ref 26.0–34.0)
MCHC: 32.7 g/dL (ref 30.0–36.0)
MCV: 82.4 fL (ref 80.0–100.0)
Platelets: 235 10*3/uL (ref 150–400)
RBC: 4.82 MIL/uL (ref 3.87–5.11)
RDW: 15 % (ref 11.5–15.5)
WBC: 8.2 10*3/uL (ref 4.0–10.5)
nRBC: 0 % (ref 0.0–0.2)

## 2020-12-27 LAB — SURGICAL PCR SCREEN
MRSA, PCR: NEGATIVE
Staphylococcus aureus: NEGATIVE

## 2020-12-27 NOTE — Progress Notes (Signed)
COVID Vaccine Completed: Yes Date COVID Vaccine completed: 06/2019 x 2 COVID vaccine manufacturer: Pfizer     COVID Test: N/A.  PCP - Dr. Shirline Frees Cardiologist - Dr. Osborne Oman. LOV: 05/28/20  Chest x-ray -  EKG - 05/26/20 Stress Test -  ECHO - 02/23/21 Cardiac Cath - 05/25/20 Pacemaker/ICD device last checked:  Sleep Study - Yes  CPAP - Yes  Fasting Blood Sugar -  Checks Blood Sugar _____ times a day  Blood Thinner Instructions: Aspirin Instructions: Last Dose:  Anesthesia review: Hx: HTN,Lt. BBB,OSA(CPAP)  Patient denies shortness of breath, fever, cough and chest pain at PAT appointment   Patient verbalized understanding of instructions that were given to them at the PAT appointment. Patient was also instructed that they will need to review over the PAT instructions again at home before surgery.

## 2020-12-28 DIAGNOSIS — M1612 Unilateral primary osteoarthritis, left hip: Secondary | ICD-10-CM | POA: Diagnosis not present

## 2020-12-28 NOTE — H&P (Signed)
HIP ARTHROPLASTY ADMISSION H&P  Patient ID: Tina Ponce MRN: [id number] DOB/AGE: Feb 10, 1966 55 y.o.  Chief Complaint: left hip pain.  Planned Procedure Date: 01/09/21 Medical Clearance by Dr. Merlene Morse   Cardiac Clearance by Melina Copa, PA-C   HPI: Tina Ponce is a 55 y.o. female who presents for evaluation of djd left hip. The patient has a history of pain and functional disability in the left hip due to arthritis and has failed non-surgical conservative treatments for greater than 12 weeks to include NSAID's and/or analgesics, corticosteriod injections, use of assistive devices, and activity modification.  Onset of symptoms was gradual, starting 5 years ago with gradually worsening course since that time. The patient noted no past surgery on the left hip.  Patient currently rates pain at 10 out of 10 with activity. Patient has worsening of pain with activity and weight bearing, pain that interferes with activities of daily living, and pain with passive range of motion.  Patient has evidence of joint space narrowing by imaging studies.  There is no active infection.  Past Medical History:  Diagnosis Date   Anxiety    Arthritis    Cancer ([place])    dx 12/12/2016 with right breast cancer   Depression    Family history of breast cancer    Genetic testing 12/26/2016   Breast/GYN panel (23 genes) @ [facility] - No pathogenic mutations detected   GERD (gastroesophageal reflux disease)    Headache    History of radiation therapy 03/13/17- 04/09/17   Right Breast, 2.67 Gy  X 15 fractions, and right breast boost 2 Gy X 5 fractions.    Hyperlipidemia    Hypertension    Left bundle branch block    Primary localized osteoarthritis of right hip 01/07/2017   Sleep apnea    CPAP q night    Unspecified vitamin D deficiency    Past Surgical History:  Procedure Laterality Date   BREAST LUMPECTOMY WITH RADIOACTIVE SEED AND SENTINEL LYMPH NODE BIOPSY Right 02/03/2017   Procedure: BREAST  LUMPECTOMY WITH RADIOACTIVE SEED AND SENTINEL LYMPH NODE BIOPSY WITH ADDITIONAL TISSUE MARGINS;  Surgeon: Jovita Kussmaul, MD;  Location: [place];  Service: General;  Laterality: Right;   CESAREAN SECTION  11/03/1990   /w epidural    DENTAL SURGERY     fillings and crown   INTRAVASCULAR PRESSURE WIRE/FFR STUDY N/A 05/25/2020   Procedure: INTRAVASCULAR PRESSURE WIRE/FFR STUDY;  Surgeon: Nelva Bush, MD;  Location: [place] [facility];  Service: Cardiovascular;  Laterality: N/A;   LEFT HEART CATH AND CORONARY ANGIOGRAPHY N/A 05/25/2020   Procedure: LEFT HEART CATH AND CORONARY ANGIOGRAPHY;  Surgeon: Nelva Bush, MD;  Location: [place] [facility];  Service: Cardiovascular;  Laterality: N/A;   TOTAL HIP ARTHROPLASTY Right 01/07/2017   Procedure: TOTAL HIP ARTHROPLASTY;  Surgeon: Marchia Bond, MD;  Location: [place];  Service: Orthopedics;  Laterality: Right;   Allergies  Allergen Reactions   Avelox [Moxifloxacin Hcl In Nacl] Swelling and Other (See Comments)    ERYTHEMA   Codeine Itching and Other (See Comments)    INTOLERANCE >  Hyperactivity and nightmares   Moxifloxacin Swelling    erythema   Flagyl [Metronidazole] Itching   Sulfamethoxazole-Trimethoprim Other (See Comments)    Other reaction(s): Red/burning in mouth   Latex Rash   Prior to Admission medications   Medication Sig Start Date End Date Taking? Authorizing Provider  acetaminophen (TYLENOL) 325 MG tablet Take 650 mg by mouth See admin instructions. Eight  times daily   Yes [provider]  ALPRAZolam (XANAX) 0.5 MG tablet Take 0.5 mg by mouth 2 (two) times daily.   Yes [provider]  amitriptyline (ELAVIL) 25 MG tablet Take 25 mg by mouth at bedtime.   Yes [provider]  baclofen (LIORESAL) 10 MG tablet Take 10 mg by mouth 4 (four) times daily as needed for muscle spasms.   Yes [provider]  CALCIUM PO Take 1 tablet by mouth daily.   Yes [provider]  Cholecalciferol  (VITAMIN D3) 50 MCG (2000 UT) TABS Take 2,000 Units by mouth in the morning and at bedtime.   Yes [provider]  famotidine (PEPCID) 20 MG tablet Take 20 mg by mouth as needed for heartburn or indigestion.   Yes [provider]  magnesium oxide (MAG-OX) 400 MG tablet Take 400 mg by mouth daily.   Yes [provider]  meloxicam (MOBIC) 15 MG tablet Take 15 mg by mouth daily. 01/23/17  Yes [provider]  neomycin-bacitracin-polymyxin (NEOSPORIN) ointment Apply 1 application topically daily as needed (Rash).   Yes [provider]  promethazine (PHENERGAN) 25 MG tablet Take 25 mg by mouth 4 (four) times daily as needed for migraine. 11/16/20  Yes [provider]  sertraline (ZOLOFT) 100 MG tablet Take 100 mg by mouth 2 (two) times daily.    Yes [provider]  SUMAtriptan (IMITREX) 100 MG tablet Take 100 mg by mouth every 2 (two) hours as needed for migraine. May repeat in 2 hours if headache persists or recurs.   Yes [provider]  traMADol (ULTRAM) 50 MG tablet Take 50 mg by mouth 3 (three) times daily.   Yes [provider]   Social History   Socioeconomic History   Marital status: Single    Spouse name: Not on file   Number of children: Not on file   Years of education: Not on file   Highest education level: Not on file  Occupational History   Not on file  Tobacco Use   Smoking status: Former    Types: Cigarettes    Quit date: 03/20/2005    Years since quitting: 15.7   Smokeless tobacco: Never  Vaping Use   Vaping Use: Never used  Substance and Sexual Activity   Alcohol use: No   Drug use: No   Sexual activity: Not on file  Other Topics Concern   Not on file  Social History Narrative   Not on file   Social Determinants of Health   Financial Resource Strain: Not on file  Food Insecurity: Not on file  Transportation Needs: Not on file  Physical Activity: Not on file  Stress: Not on file   Social Connections: Not on file   Family History  Problem Relation Age of Onset   Hypertension Mother    Diabetes Mother    Cancer Father        lung; possibly prostate   Breast cancer Sister 77       negative genetic testing @ [facility]   Prostate cancer Maternal Grandfather 42   Breast cancer Maternal Aunt        Dx 64s; possibly also colon cancer   Cancer Maternal Uncle        one with lung; one with oral   Breast cancer Sister 26       negative genetic testing @ [facility]   Ovarian cancer Maternal Aunt        Dx  80s    ROS: Currently denies lightheadedness, dizziness, Fever, chills, CP, SOB.   No personal history of DVT, PE, MI, or CVA. No loose teeth or dentures All other systems have been reviewed and were otherwise currently negative with the exception of those mentioned in the HPI and as above.  Objective: Vitals: Ht: 5\' 10"  Wt: 235 lbs Temp: 98.5 BP: 153/93 Pulse: 75 O2 98% on room air.   Physical Exam: General: Alert, NAD. Trendelenberg Gait  HEENT: EOMI, Good Neck Extension  Pulm: No increased work of breathing.  Clear B/L A/P w/o crackle or wheeze.  CV: RRR, No m/g/r appreciated  GI: soft, NT, ND Neuro: Neuro without gross focal deficit.  Sensation intact distally Skin: No lesions in the area of chief complaint MSK/Surgical Site: left Hip pain with passive ROM. 5 deg of active internal and external rotation with pain. 45 deg FF with pain. Positive Stinchfield.  NVI.  Decreased quad strength, able to perform straight leg.  Imaging Review Plain radiographs demonstrate severe degenerative joint disease of the left hip.   The bone quality appears to be adequate for age and reported activity level.  Preoperative templating of the joint replacement has been completed, documented, and submitted to the Operating Room personnel in order to optimize intra-operative equipment management.  Assessment: djd left hip Principal Problem:   Osteoarthritis of left  hip   Plan: Plan for Procedure(s): TOTAL HIP ARTHROPLASTY  The patient history, physical exam, clinical judgement of the provider and imaging are consistent with end stage degenerative joint disease and total joint arthroplasty is deemed medically necessary. The treatment options including medical management, injection therapy, and arthroplasty were discussed at length. The risks and benefits of Procedure(s): TOTAL HIP ARTHROPLASTY were presented and reviewed.  The risks of nonoperative treatment, versus surgical intervention including but not limited to continued pain, aseptic loosening, stiffness, dislocation/subluxation, infection, bleeding, nerve injury, blood clots, cardiopulmonary complications, morbidity, mortality, among others were discussed. The patient verbalizes understanding and wishes to proceed with the plan.  Patient is being admitted for surgery, pain control, PT, prophylactic antibiotics, VTE prophylaxis, progressive ambulation, ADL's and discharge planning.   Dental prophylaxis discussed and recommended for 2 years postoperatively.  The patient does meet the criteria for TXA which will be used perioperatively.   ASA 325 mg  will be used postoperatively for DVT prophylaxis in addition to SCDs, and early ambulation. The patient is planning to be discharged home with OPPT in care of her son   Ventura Bruns, Hershal Coria 12/28/2020 12:20 PM

## 2020-12-28 NOTE — Progress Notes (Signed)
Anesthesia Chart Review:   Case: [id number] Date/Time: 01/09/21 0715   Procedure: TOTAL HIP ARTHROPLASTY (Left: Hip)   Anesthesia type: Choice   Pre-op diagnosis: djd left hip   Location: [facility]OM 07 / [facility]   Surgeons: Marchia Bond, MD       DISCUSSION: Pt is 55 years old with hx HTN, LBBB, OSA  VS: BP (!) 169/99   Pulse 86   Temp 37 C (Oral)   Resp 18   Ht 5\' 8"  (1.727 m)   Wt 106.6 kg   SpO2 98%   BMI 35.73 kg/m   PROVIDERS: - PCP is Shirline Frees, MD - Cardiologist is Rudean Haskell, MD. Last office visit 05/04/20. Cleared for surgery by Melina Copa, PA on 11/28/20   LABS: Labs reviewed: Acceptable for surgery. (all labs ordered are listed, but only abnormal results are displayed)  Labs Reviewed  SURGICAL PCR SCREEN  CBC  BASIC METABOLIC PANEL    EKG 2/87/86: Sinus tachycardia. PVC. Probable left atrial enlargement. LBBB (LBBB present on EKG dating back to at least 2021)   CV: Cardiac cath 05/25/20 (done for abnormal stress test):  Minimal plaquing of the proximal RCA; otherwise no angiographically significant coronary artery disease. No evidence of coronary microvascular dysfunction, with CFR of 3.5 (nl > 2.5) and IMR 9 (nl < 25). Low normal left ventricular contraction with mildly elevated filling pressure  Echo 02/24/20:  1. Left ventricular ejection fraction, by estimation, is 50 to 55%. The left ventricle has low normal function. The left ventricle has no regional wall motion abnormalities. Left ventricular diastolic parameters are consistent with Grade I diastolic dysfunction (impaired relaxation).  2. Right ventricular systolic function is normal. The right ventricular size is normal. There is normal pulmonary artery systolic pressure.  3. The mitral valve is normal in structure. Trivial mitral valve regurgitation.  4. The aortic valve is tricuspid. Aortic valve regurgitation is not visualized.  5. The inferior vena cava is normal in size with  greater than 50% respiratory variability, suggesting right atrial pressure of 3 mmHg.    Past Medical History:  Diagnosis Date   Anxiety    Arthritis    Cancer ([place])    dx 12/12/2016 with right breast cancer   Depression    Family history of breast cancer    Genetic testing 12/26/2016   Breast/GYN panel (23 genes) @ [facility] - No pathogenic mutations detected   GERD (gastroesophageal reflux disease)    Headache    History of radiation therapy 03/13/17- 04/09/17   Right Breast, 2.67 Gy  X 15 fractions, and right breast boost 2 Gy X 5 fractions.    Hyperlipidemia    Hypertension    Left bundle branch block    Primary localized osteoarthritis of right hip 01/07/2017   Sleep apnea    CPAP q night    Unspecified vitamin D deficiency     Past Surgical History:  Procedure Laterality Date   BREAST LUMPECTOMY WITH RADIOACTIVE SEED AND SENTINEL LYMPH NODE BIOPSY Right 02/03/2017   Procedure: BREAST LUMPECTOMY WITH RADIOACTIVE SEED AND SENTINEL LYMPH NODE BIOPSY WITH ADDITIONAL TISSUE MARGINS;  Surgeon: Jovita Kussmaul, MD;  Location: [place];  Service: General;  Laterality: Right;   CESAREAN SECTION  11/03/1990   /w epidural    DENTAL SURGERY     fillings and crown   INTRAVASCULAR PRESSURE WIRE/FFR STUDY N/A 05/25/2020   Procedure: INTRAVASCULAR PRESSURE WIRE/FFR STUDY;  Surgeon: Nelva Bush, MD;  Location: [place] [facility];  Service: Cardiovascular;  Laterality: N/A;   LEFT HEART CATH AND CORONARY ANGIOGRAPHY N/A 05/25/2020   Procedure: LEFT HEART CATH AND CORONARY ANGIOGRAPHY;  Surgeon: Nelva Bush, MD;  Location: [place] [facility];  Service: Cardiovascular;  Laterality: N/A;   TOTAL HIP ARTHROPLASTY Right 01/07/2017   Procedure: TOTAL HIP ARTHROPLASTY;  Surgeon: Marchia Bond, MD;  Location: [place];  Service: Orthopedics;  Laterality: Right;    MEDICATIONS:  acetaminophen (TYLENOL) 325 MG tablet   ALPRAZolam (XANAX) 0.5 MG tablet   amitriptyline (ELAVIL) 25 MG tablet    baclofen (LIORESAL) 10 MG tablet   CALCIUM PO   Cholecalciferol (VITAMIN D3) 50 MCG (2000 UT) TABS   famotidine (PEPCID) 20 MG tablet   magnesium oxide (MAG-OX) 400 MG tablet   meloxicam (MOBIC) 15 MG tablet   neomycin-bacitracin-polymyxin (NEOSPORIN) ointment   promethazine (PHENERGAN) 25 MG tablet   sertraline (ZOLOFT) 100 MG tablet   SUMAtriptan (IMITREX) 100 MG tablet   traMADol (ULTRAM) 50 MG tablet   No current facility-administered medications for this encounter.    If no changes, I anticipate pt can proceed with surgery as scheduled.   Willeen Cass, PhD, FNP-BC [facility]/Anesthesiology Phone: [phone] 12/28/2020 12:30 PM

## 2020-12-28 NOTE — Anesthesia Preprocedure Evaluation (Addendum)
Anesthesia Evaluation  Patient identified by MRN, date of birth, ID band Patient awake    Reviewed: Allergy & Precautions, NPO status , Patient's Chart, lab work & pertinent test results  Airway Mallampati: II  TM Distance: >3 FB     Dental   Pulmonary sleep apnea , former smoker,    breath sounds clear to auscultation       Cardiovascular hypertension, + dysrhythmias  Rhythm:Regular Rate:Normal     Neuro/Psych  Headaches, Anxiety Depression    GI/Hepatic Neg liver ROS, GERD  ,  Endo/Other  negative endocrine ROS  Renal/GU negative Renal ROS     Musculoskeletal   Abdominal   Peds  Hematology   Anesthesia Other Findings   Reproductive/Obstetrics                            Anesthesia Physical Anesthesia Plan  ASA: 3  Anesthesia Plan: Spinal   Post-op Pain Management:    Induction: Intravenous  PONV Risk Score and Plan: 2 and Ondansetron, Dexamethasone and Midazolam  Airway Management Planned: Nasal Cannula and Simple Face Mask  Additional Equipment:   Intra-op Plan:   Post-operative Plan:   Informed Consent: I have reviewed the patients History and Physical, chart, labs and discussed the procedure including the risks, benefits and alternatives for the proposed anesthesia with the patient or authorized representative who has indicated his/her understanding and acceptance.     Dental advisory given  Plan Discussed with: CRNA and Anesthesiologist  Anesthesia Plan Comments: (See APP note by Durel Salts, FNP )       Anesthesia Quick Evaluation

## 2020-12-29 NOTE — Care Plan (Signed)
Ortho Bundle Case Management Note  Patient Details  Name: Tina Ponce MRN: [id number] Date of Birth: 1966/02/25    Patient seen in the office prior to surgery. She will discharge to home with family to assist. Rolling walker ordered from [place]. OPPT set up with [place]. Patient and MD in agreement with plan. Choice offered.                  DME Arranged:  [place] Rile rolling DME Agency:  [employer]  HH Arranged:    [place] Agency:     Additional Comments: Please contact me with any questions of if this plan should need to change.  Ladell Heads,  [place] Specialist  [phone] 12/29/2020, 3:49 PM

## 2021-01-09 ENCOUNTER — Ambulatory Visit (HOSPITAL_COMMUNITY): Payer: Medicare Other

## 2021-01-09 ENCOUNTER — Encounter (HOSPITAL_COMMUNITY)
Admission: RE | Disposition: A | Discharge status: Home / Self Care | Payer: Self-pay | Source: Home / Self Care | Attending: Orthopedic Surgery

## 2021-01-09 ENCOUNTER — Encounter (HOSPITAL_COMMUNITY): Payer: Self-pay | Admitting: Orthopedic Surgery

## 2021-01-09 ENCOUNTER — Ambulatory Visit (HOSPITAL_COMMUNITY)
Admission: RE | Admit: 2021-01-09 | Discharge: 2021-01-09 | Disposition: A | Discharge status: Home / Self Care | Payer: Medicare Other | Attending: Orthopedic Surgery | Admitting: Orthopedic Surgery

## 2021-01-09 ENCOUNTER — Other Ambulatory Visit: Payer: Self-pay

## 2021-01-09 ENCOUNTER — Ambulatory Visit (HOSPITAL_COMMUNITY): Payer: Medicare Other | Admitting: Anesthesiology

## 2021-01-09 ENCOUNTER — Ambulatory Visit (HOSPITAL_COMMUNITY): Payer: Medicare Other | Admitting: Emergency Medicine

## 2021-01-09 DIAGNOSIS — K219 Gastro-esophageal reflux disease without esophagitis: Secondary | ICD-10-CM | POA: Diagnosis not present

## 2021-01-09 DIAGNOSIS — I10 Essential (primary) hypertension: Secondary | ICD-10-CM | POA: Diagnosis not present

## 2021-01-09 DIAGNOSIS — Z79899 Other long term (current) drug therapy: Secondary | ICD-10-CM | POA: Insufficient documentation

## 2021-01-09 DIAGNOSIS — M1612 Unilateral primary osteoarthritis, left hip: Secondary | ICD-10-CM | POA: Insufficient documentation

## 2021-01-09 DIAGNOSIS — Z881 Allergy status to other antibiotic agents status: Secondary | ICD-10-CM | POA: Insufficient documentation

## 2021-01-09 DIAGNOSIS — Z96642 Presence of left artificial hip joint: Secondary | ICD-10-CM | POA: Diagnosis not present

## 2021-01-09 DIAGNOSIS — Z853 Personal history of malignant neoplasm of breast: Secondary | ICD-10-CM | POA: Diagnosis not present

## 2021-01-09 DIAGNOSIS — G4730 Sleep apnea, unspecified: Secondary | ICD-10-CM | POA: Insufficient documentation

## 2021-01-09 DIAGNOSIS — Z885 Allergy status to narcotic agent status: Secondary | ICD-10-CM | POA: Diagnosis not present

## 2021-01-09 DIAGNOSIS — Z87891 Personal history of nicotine dependence: Secondary | ICD-10-CM | POA: Diagnosis not present

## 2021-01-09 DIAGNOSIS — Z96643 Presence of artificial hip joint, bilateral: Secondary | ICD-10-CM | POA: Diagnosis not present

## 2021-01-09 DIAGNOSIS — E785 Hyperlipidemia, unspecified: Secondary | ICD-10-CM | POA: Diagnosis not present

## 2021-01-09 DIAGNOSIS — Z791 Long term (current) use of non-steroidal anti-inflammatories (NSAID): Secondary | ICD-10-CM | POA: Diagnosis not present

## 2021-01-09 DIAGNOSIS — I447 Left bundle-branch block, unspecified: Secondary | ICD-10-CM | POA: Diagnosis not present

## 2021-01-09 DIAGNOSIS — Z471 Aftercare following joint replacement surgery: Secondary | ICD-10-CM | POA: Diagnosis not present

## 2021-01-09 HISTORY — PX: TOTAL HIP ARTHROPLASTY: SHX124

## 2021-01-09 SURGERY — ARTHROPLASTY, HIP, TOTAL,POSTERIOR APPROACH
Anesthesia: Spinal | Site: Hip | Laterality: Left

## 2021-01-09 MED ORDER — LACTATED RINGERS IV BOLUS
500.0000 mL | Freq: Once | INTRAVENOUS | Status: AC
  Administered 2021-01-09: 500 mL via INTRAVENOUS

## 2021-01-09 MED ORDER — KETOROLAC TROMETHAMINE 30 MG/ML IJ SOLN
INTRAMUSCULAR | Status: AC
  Filled 2021-01-09: qty 1

## 2021-01-09 MED ORDER — OXYCODONE HCL 5 MG PO TABS: 5.0000 mg | ORAL_TABLET | ORAL | Status: DC | PRN

## 2021-01-09 MED ORDER — LACTATED RINGERS IV BOLUS: 250.0000 mL | Freq: Once | INTRAVENOUS | Status: DC

## 2021-01-09 MED ORDER — OXYCODONE HCL 5 MG PO TABS
ORAL_TABLET | ORAL | Status: AC
  Filled 2021-01-09: qty 2

## 2021-01-09 MED ORDER — TRANEXAMIC ACID-NACL 1000-0.7 MG/100ML-% IV SOLN
1000.0000 mg | INTRAVENOUS | Status: AC
  Administered 2021-01-09: 1000 mg via INTRAVENOUS
  Filled 2021-01-09: qty 100

## 2021-01-09 MED ORDER — OXYCODONE HCL 5 MG PO TABS: 5.0000 mg | ORAL_TABLET | ORAL | 0 refills | Status: DC | PRN

## 2021-01-09 MED ORDER — BUPIVACAINE HCL (PF) 0.25 % IJ SOLN
INTRAMUSCULAR | Status: DC | PRN
  Administered 2021-01-09 (×2): 30 mL

## 2021-01-09 MED ORDER — PROPOFOL 10 MG/ML IV BOLUS
INTRAVENOUS | Status: AC
  Filled 2021-01-09: qty 20

## 2021-01-09 MED ORDER — CEFAZOLIN SODIUM-DEXTROSE 2-4 GM/100ML-% IV SOLN
INTRAVENOUS | Status: AC
  Filled 2021-01-09: qty 100

## 2021-01-09 MED ORDER — ONDANSETRON HCL 4 MG/2ML IJ SOLN
INTRAMUSCULAR | Status: AC
  Filled 2021-01-09: qty 2

## 2021-01-09 MED ORDER — ACETAMINOPHEN 500 MG PO TABS
1000.0000 mg | ORAL_TABLET | Freq: Once | ORAL | Status: AC
  Administered 2021-01-09: 1000 mg via ORAL
  Filled 2021-01-09: qty 2

## 2021-01-09 MED ORDER — LACTATED RINGERS IV SOLN: INTRAVENOUS | Status: DC

## 2021-01-09 MED ORDER — BUPIVACAINE HCL (PF) 0.5 % IJ SOLN
INTRAMUSCULAR | Status: DC | PRN
  Administered 2021-01-09: 15 mg

## 2021-01-09 MED ORDER — PROPOFOL 500 MG/50ML IV EMUL
INTRAVENOUS | Status: AC
  Filled 2021-01-09: qty 50

## 2021-01-09 MED ORDER — SENNA-DOCUSATE SODIUM 8.6-50 MG PO TABS: 2.0000 | ORAL_TABLET | Freq: Every day | ORAL | 1 refills | Status: DC

## 2021-01-09 MED ORDER — LACTATED RINGERS IV BOLUS
250.0000 mL | Freq: Once | INTRAVENOUS | Status: AC
  Administered 2021-01-09: 250 mL via INTRAVENOUS

## 2021-01-09 MED ORDER — 0.9 % SODIUM CHLORIDE (POUR BTL) OPTIME
TOPICAL | Status: DC | PRN
  Administered 2021-01-09: 1000 mL

## 2021-01-09 MED ORDER — TRANEXAMIC ACID-NACL 1000-0.7 MG/100ML-% IV SOLN
1000.0000 mg | Freq: Once | INTRAVENOUS | Status: AC
  Administered 2021-01-09: 1000 mg via INTRAVENOUS

## 2021-01-09 MED ORDER — ONDANSETRON HCL 4 MG/2ML IJ SOLN
INTRAMUSCULAR | Status: DC | PRN
  Administered 2021-01-09: 4 mg via INTRAVENOUS

## 2021-01-09 MED ORDER — ACETAMINOPHEN 325 MG PO TABS: 650.0000 mg | ORAL_TABLET | ORAL | 0 refills | Status: AC | PRN

## 2021-01-09 MED ORDER — BACLOFEN 10 MG PO TABS: 10.0000 mg | ORAL_TABLET | Freq: Three times a day (TID) | ORAL | 0 refills | Status: AC | PRN

## 2021-01-09 MED ORDER — FENTANYL CITRATE PF 50 MCG/ML IJ SOSY
PREFILLED_SYRINGE | INTRAMUSCULAR | Status: AC
  Filled 2021-01-09: qty 2

## 2021-01-09 MED ORDER — TRANEXAMIC ACID-NACL 1000-0.7 MG/100ML-% IV SOLN
INTRAVENOUS | Status: AC
  Filled 2021-01-09: qty 100

## 2021-01-09 MED ORDER — POVIDONE-IODINE 10 % EX SWAB
2.0000 "application " | Freq: Once | CUTANEOUS | Status: AC
  Administered 2021-01-09: 2 via TOPICAL

## 2021-01-09 MED ORDER — BUPIVACAINE HCL (PF) 0.25 % IJ SOLN
INTRAMUSCULAR | Status: AC
  Filled 2021-01-09: qty 30

## 2021-01-09 MED ORDER — CEFAZOLIN SODIUM-DEXTROSE 2-4 GM/100ML-% IV SOLN
2.0000 g | Freq: Four times a day (QID) | INTRAVENOUS | Status: DC
  Administered 2021-01-09: 2 g via INTRAVENOUS

## 2021-01-09 MED ORDER — STERILE WATER FOR IRRIGATION IR SOLN
Status: DC | PRN
  Administered 2021-01-09: 2000 mL

## 2021-01-09 MED ORDER — EPHEDRINE 5 MG/ML INJ
INTRAVENOUS | Status: AC
  Filled 2021-01-09: qty 5

## 2021-01-09 MED ORDER — PROPOFOL 10 MG/ML IV BOLUS
INTRAVENOUS | Status: DC | PRN
  Administered 2021-01-09 (×2): 20 mg via INTRAVENOUS

## 2021-01-09 MED ORDER — ORAL CARE MOUTH RINSE: 15.0000 mL | Freq: Once | OROMUCOSAL | Status: AC

## 2021-01-09 MED ORDER — FENTANYL CITRATE PF 50 MCG/ML IJ SOSY
25.0000 ug | PREFILLED_SYRINGE | INTRAMUSCULAR | Status: DC | PRN
  Administered 2021-01-09 (×2): 50 ug via INTRAVENOUS

## 2021-01-09 MED ORDER — KETOROLAC TROMETHAMINE 30 MG/ML IJ SOLN
INTRAMUSCULAR | Status: DC | PRN
  Administered 2021-01-09 (×2): 30 mg

## 2021-01-09 MED ORDER — DEXAMETHASONE SODIUM PHOSPHATE 10 MG/ML IJ SOLN
INTRAMUSCULAR | Status: DC | PRN
  Administered 2021-01-09: 10 mg via INTRAVENOUS

## 2021-01-09 MED ORDER — ASPIRIN EC 325 MG PO TBEC: 325.0000 mg | DELAYED_RELEASE_TABLET | Freq: Two times a day (BID) | ORAL | 0 refills | Status: DC

## 2021-01-09 MED ORDER — CEFAZOLIN SODIUM-DEXTROSE 2-4 GM/100ML-% IV SOLN
2.0000 g | INTRAVENOUS | Status: AC
  Administered 2021-01-09: 2 g via INTRAVENOUS
  Filled 2021-01-09: qty 100

## 2021-01-09 MED ORDER — MIDAZOLAM HCL 5 MG/5ML IJ SOLN
INTRAMUSCULAR | Status: DC | PRN
  Administered 2021-01-09: 2 mg via INTRAVENOUS

## 2021-01-09 MED ORDER — PROPOFOL 500 MG/50ML IV EMUL
INTRAVENOUS | Status: DC | PRN
  Administered 2021-01-09: 100 ug/kg/min via INTRAVENOUS

## 2021-01-09 MED ORDER — DEXAMETHASONE SODIUM PHOSPHATE 10 MG/ML IJ SOLN
INTRAMUSCULAR | Status: AC
  Filled 2021-01-09: qty 1

## 2021-01-09 MED ORDER — PHENYLEPHRINE 40 MCG/ML (10ML) SYRINGE FOR IV PUSH (FOR BLOOD PRESSURE SUPPORT)
PREFILLED_SYRINGE | INTRAVENOUS | Status: DC | PRN
  Administered 2021-01-09: 80 ug via INTRAVENOUS

## 2021-01-09 MED ORDER — PHENYLEPHRINE 40 MCG/ML (10ML) SYRINGE FOR IV PUSH (FOR BLOOD PRESSURE SUPPORT)
PREFILLED_SYRINGE | INTRAVENOUS | Status: AC
  Filled 2021-01-09: qty 10

## 2021-01-09 MED ORDER — OXYCODONE HCL 5 MG PO TABS
10.0000 mg | ORAL_TABLET | ORAL | Status: DC | PRN
  Administered 2021-01-09: 10 mg via ORAL

## 2021-01-09 MED ORDER — POVIDONE-IODINE 10 % EX SWAB: 2.0000 "application " | Freq: Once | CUTANEOUS | Status: DC

## 2021-01-09 MED ORDER — MIDAZOLAM HCL 2 MG/2ML IJ SOLN
INTRAMUSCULAR | Status: AC
  Filled 2021-01-09: qty 2

## 2021-01-09 MED ORDER — PROPOFOL 1000 MG/100ML IV EMUL
INTRAVENOUS | Status: AC
  Filled 2021-01-09: qty 100

## 2021-01-09 MED ORDER — FENTANYL CITRATE (PF) 100 MCG/2ML IJ SOLN
INTRAMUSCULAR | Status: AC
  Filled 2021-01-09: qty 2

## 2021-01-09 MED ORDER — EPHEDRINE SULFATE-NACL 50-0.9 MG/10ML-% IV SOSY
PREFILLED_SYRINGE | INTRAVENOUS | Status: DC | PRN
  Administered 2021-01-09: 5 mg via INTRAVENOUS

## 2021-01-09 MED ORDER — PHENYLEPHRINE HCL-NACL 20-0.9 MG/250ML-% IV SOLN
INTRAVENOUS | Status: AC
  Filled 2021-01-09: qty 250

## 2021-01-09 MED ORDER — CHLORHEXIDINE GLUCONATE 0.12 % MT SOLN
15.0000 mL | Freq: Once | OROMUCOSAL | Status: AC
  Administered 2021-01-09: 15 mL via OROMUCOSAL

## 2021-01-09 MED ORDER — FENTANYL CITRATE (PF) 100 MCG/2ML IJ SOLN
INTRAMUSCULAR | Status: DC | PRN
  Administered 2021-01-09 (×2): 25 ug via INTRAVENOUS
  Administered 2021-01-09: 50 ug via INTRAVENOUS

## 2021-01-09 SURGICAL SUPPLY — 69 items
BAG COUNTER SPONGE SURGICOUNT (BAG) IMPLANT
BAG SPNG CNTER NS LX DISP (BAG)
BIT DRILL 2.0X128 (BIT) ×2 IMPLANT
BLADE SAW SAG 73X25 THK (BLADE) ×1
BLADE SAW SGTL 73X25 THK (BLADE) ×1 IMPLANT
CLSR STERI-STRIP ANTIMIC 1/2X4 (GAUZE/BANDAGES/DRESSINGS) ×4 IMPLANT
COVER SURGICAL LIGHT HANDLE (MISCELLANEOUS) ×2 IMPLANT
DECANTER SPIKE VIAL GLASS SM (MISCELLANEOUS) ×1 IMPLANT
DRAPE INCISE IOBAN 66X45 STRL (DRAPES) ×3 IMPLANT
DRAPE ORTHO SPLIT 77X108 STRL (DRAPES) ×4
DRAPE POUCH INSTRU U-SHP 10X18 (DRAPES) ×2 IMPLANT
DRAPE SHEET LG 3/4 BI-LAMINATE (DRAPES) ×2 IMPLANT
DRAPE SURG 17X11 SM STRL (DRAPES) ×2 IMPLANT
DRAPE SURG ORHT 6 SPLT 77X108 (DRAPES) ×2 IMPLANT
DRAPE U-SHAPE 47X51 STRL (DRAPES) ×2 IMPLANT
DRSG MEPILEX BORDER 4X12 (GAUZE/BANDAGES/DRESSINGS) ×1 IMPLANT
DRSG MEPILEX BORDER 4X8 (GAUZE/BANDAGES/DRESSINGS) ×2 IMPLANT
DURAPREP 26ML APPLICATOR (WOUND CARE) ×5 IMPLANT
ELECT BLADE TIP CTD 4 INCH (ELECTRODE) ×2 IMPLANT
ELECT REM PT RETURN 15FT ADLT (MISCELLANEOUS) ×2 IMPLANT
ELIMINATOR HOLE APEX DEPUY (Hips) ×1 IMPLANT
FACESHIELD WRAPAROUND (MASK) ×2 IMPLANT
FACESHIELD WRAPAROUND OR TEAM (MASK) ×1 IMPLANT
GLOVE SRG 8 PF TXTR STRL LF DI (GLOVE) ×1 IMPLANT
GLOVE SURG ENC MOIS LTX SZ7.5 (GLOVE) ×1 IMPLANT
GLOVE SURG POLY ORTHO LF SZ7 (GLOVE) ×3 IMPLANT
GLOVE SURG POLYISO LF SZ6.5 (GLOVE) ×2 IMPLANT
GLOVE SURG POLYISO LF SZ7.5 (GLOVE) ×2 IMPLANT
GLOVE SURG UNDER POLY LF SZ7 (GLOVE) ×2 IMPLANT
GLOVE SURG UNDER POLY LF SZ8 (GLOVE) ×2
GOWN STRL REUS W/TWL LRG LVL3 (GOWN DISPOSABLE) ×4 IMPLANT
GOWN STRL REUS W/TWL XL LVL3 (GOWN DISPOSABLE) ×2 IMPLANT
HEAD CERAMIC 36 PLUS5 (Hips) ×1 IMPLANT
HOOD PEEL AWAY FLYTE STAYCOOL (MISCELLANEOUS) ×6 IMPLANT
KIT BASIN OR (CUSTOM PROCEDURE TRAY) ×2 IMPLANT
KIT TURNOVER KIT A (KITS) IMPLANT
LINER NEUTRAL 52X36MM PLUS 4 (Liner) ×1 IMPLANT
MANIFOLD NEPTUNE II (INSTRUMENTS) ×2 IMPLANT
NDL MA TROC 1/2 (NEEDLE) IMPLANT
NDL SAFETY ECLIPSE 18X1.5 (NEEDLE) ×2 IMPLANT
NEEDLE HYPO 18GX1.5 SHARP (NEEDLE) ×4
NEEDLE HYPO 22GX1.5 SAFETY (NEEDLE) ×1 IMPLANT
NEEDLE MA TROC 1/2 (NEEDLE) IMPLANT
NS IRRIG 1000ML POUR BTL (IV SOLUTION) ×2 IMPLANT
PACK TOTAL JOINT (CUSTOM PROCEDURE TRAY) ×2 IMPLANT
PIN SECTOR W/GRIP ACE CUP 52MM (Hips) ×1 IMPLANT
PROTECTOR NERVE ULNAR (MISCELLANEOUS) ×2 IMPLANT
RETRIEVER SUT HEWSON (MISCELLANEOUS) ×2 IMPLANT
SCREW 6.5MMX25MM (Screw) ×1 IMPLANT
SPONGE T-LAP 18X18 ~~LOC~~+RFID (SPONGE) ×2 IMPLANT
STEM OFFSET DUOFIX SZ6 STD (Stem) ×1 IMPLANT
STRIP CLOSURE SKIN 1/2X4 (GAUZE/BANDAGES/DRESSINGS) ×1 IMPLANT
SUCTION FRAZIER HANDLE 12FR (TUBING) ×2
SUCTION TUBE FRAZIER 12FR DISP (TUBING) ×1 IMPLANT
SUT FIBERWIRE #2 38 REV NDL BL (SUTURE) ×6
SUT VIC AB 0 CT1 36 (SUTURE) ×2 IMPLANT
SUT VIC AB 1 CT1 36 (SUTURE) ×4 IMPLANT
SUT VIC AB 2-0 CT1 27 (SUTURE) ×4
SUT VIC AB 2-0 CT1 TAPERPNT 27 (SUTURE) ×2 IMPLANT
SUT VIC AB 3-0 SH 8-18 (SUTURE) ×2 IMPLANT
SUTURE FIBERWR#2 38 REV NDL BL (SUTURE) ×3 IMPLANT
SYR 3ML LL SCALE MARK (SYRINGE) ×1 IMPLANT
SYR CONTROL 10ML LL (SYRINGE) ×4 IMPLANT
TOWEL OR 17X26 10 PK STRL BLUE (TOWEL DISPOSABLE) ×2 IMPLANT
TRAY FOL W/BAG SLVR 16FR STRL (SET/KITS/TRAYS/PACK) IMPLANT
TRAY FOLEY MTR SLVR 16FR STAT (SET/KITS/TRAYS/PACK) ×1 IMPLANT
TRAY FOLEY W/BAG SLVR 16FR LF (SET/KITS/TRAYS/PACK) ×2
TUBE SUCTION HIGH CAP CLEAR NV (SUCTIONS) ×2 IMPLANT
WATER STERILE IRR 1000ML POUR (IV SOLUTION) ×4 IMPLANT

## 2021-01-09 NOTE — Anesthesia Postprocedure Evaluation (Signed)
Anesthesia Post Note  Patient: Tina Ponce  Procedure(s) Performed: TOTAL HIP ARTHROPLASTY (Left: Hip)     Patient location during evaluation: PACU Anesthesia Type: Spinal Level of consciousness: awake Pain management: pain level controlled Vital Signs Assessment: post-procedure vital signs reviewed and stable Respiratory status: spontaneous breathing Cardiovascular status: stable Postop Assessment: no apparent nausea or vomiting Anesthetic complications: no   No notable events documented.  Last Vitals:  Vitals:   01/09/21 1345 01/09/21 1350  BP:  130/81  Pulse: 75 74  Resp:    Temp:    SpO2: 96% 96%    Last Pain:  Vitals:   01/09/21 1245  TempSrc:   PainSc: 8                  Kenadee Gates

## 2021-01-09 NOTE — Anesthesia Procedure Notes (Signed)
Spinal  Patient location during procedure: OR End time: 01/09/2021 7:37 AM Reason for block: surgical anesthesia Staffing Performed: resident/CRNA  Resident/CRNA: Roosevelt Bisher D, CRNA Preanesthetic Checklist Completed: patient identified, IV checked, site marked, risks and benefits discussed, surgical consent, monitors and equipment checked, pre-op evaluation and timeout performed Spinal Block Patient position: sitting Prep: ChloraPrep Patient monitoring: heart rate, continuous pulse ox and blood pressure Approach: midline Location: L4-5 Injection technique: single-shot Needle Needle type: Spinocan  Needle gauge: 24 G Needle length: 9 cm Assessment Sensory level: T6 Events: CSF return

## 2021-01-09 NOTE — Discharge Instructions (Signed)

## 2021-01-09 NOTE — Op Note (Signed)
01/09/2021  9:40 AM  PATIENT:  Tina Ponce   MRN: [id number]  PRE-OPERATIVE DIAGNOSIS: Left hip primary localized osteoarthritis  POST-OPERATIVE DIAGNOSIS:  same  PROCEDURE:  Procedure(s): LEFT TOTAL HIP ARTHROPLASTY  PREOPERATIVE INDICATIONS:    Tina Ponce is an 55 y.o. female who has a diagnosis of left hip primary localized osteoarthritis and elected for surgical management after failing conservative treatment.  The risks benefits and alternatives were discussed with the patient including but not limited to the risks of nonoperative treatment, versus surgical intervention including infection, bleeding, nerve injury, periprosthetic fracture, the need for revision surgery, dislocation, leg length discrepancy, blood clots, cardiopulmonary complications, morbidity, mortality, among others, and they were willing to proceed.     OPERATIVE REPORT     SURGEON:  Marchia Bond, MD    ASSISTANT:  Merlene Pulling, PA-C, (Present throughout the entire procedure,  necessary for completion of procedure in a timely manner, assisting with retraction, instrumentation, and closure)     ANESTHESIA: Spinal  ESTIMATED BLOOD LOSS: 782 mL    COMPLICATIONS:  None.     UNIQUE ASPECTS OF THE CASE: The left hip was extremely short on examination preoperatively.  The reamer for the femur went to a size 6 relatively easily, although she was tighter proximally.  I used the majority of the depth on the acetabulum getting to the medial wall, and had substantial cancellous bleeding bone, and a appropriate anterior wall.  I had some posterior and superior beads showing.  If anything I was slightly overly anteverted, but she was not impinging posteriorly.  I went up to a size 6 on the femur, potting this slightly proud in order to make up length.  The 5 broach was just a little bit loose after trialing, and I was already going to be at an 8.5 on the neck length.  Ultimately I potted the 6 proud, and used a 5  ceramic head and had symmetric leg lengths, although she may feel long because she was substantially short preoperatively.  The soft tissues did come down to bone, although they were slightly tight.  COMPONENTS:  [employer] fit femur size 6 standard with a 36 mm + 5 ceramic head ball and a Gription Acetabular shell size 52, with a single cancellous screw for backup fixation, with an apex hole eliminator and a +4 neutral polyethylene liner.    PROCEDURE IN DETAIL:   The patient was met in the holding area and  identified.  The appropriate hip was identified and marked at the operative site.  The patient was then transported to the OR  and  placed under anesthesia.  At that point, the patient was  placed in the lateral decubitus position with the operative side up and  secured to the operating room table and all bony prominences padded.     The operative lower extremity was prepped from the iliac crest to the distal leg.  Sterile draping was performed.  Time out was performed prior to incision.      A routine posterolateral approach was utilized via sharp dissection  carried down to the subcutaneous tissue.  Gross bleeders were Bovie coagulated.  The iliotibial band was identified and incised along the length of the skin incision.  Self-retaining retractors were  inserted.  With the hip internally rotated, the short external rotators  were identified. The piriformis and capsule was tagged with FiberWire, and the hip capsule released in a T-type fashion.  The femoral neck was  exposed, and I resected the femoral neck using the appropriate jig. This was performed at approximately a thumb's breadth above the lesser trochanter.    I then exposed the deep acetabulum, cleared out any tissue including the ligamentum teres.  A wing retractor was placed.  After adequate visualization, I excised the labrum, and then sequentially reamed.  I placed the trial acetabulum, which seated nicely, and then  impacted the real cup into place.  Appropriate version and inclination was confirmed clinically matching their bony anatomy, and also with the use of the jig.  I placed a cancellous screw to augment fixation.  A trial polyethylene liner was placed and the wing retractor removed.    I then prepared the proximal femur using the cookie-cutter, the lateralizing reamer, and then sequentially reamed and broached.  A trial broach, neck, and head was utilized, and I reduced the hip and it was found to have excellent stability with functional range of motion. The trial components were then removed, and the real polyethylene liner was placed.  I then impacted the real femoral prosthesis into place into the appropriate version, slightly anteverted to the normal anatomy, and I impacted the real head ball into place. The hip was then reduced and taken through functional range of motion and found to have excellent stability. Leg lengths were restored.  I then used a 2 mm drill bits to pass the FiberWire suture from the capsule and piriformis through the greater trochanter, and secured this. Excellent posterior capsular repair was achieved. I also closed the T in the capsule.  I then irrigated the hip copiously again with pulse lavage, and repaired the fascia with Vicryl, followed by Vicryl for the subcutaneous tissue, Monocryl for the skin, Steri-Strips and sterile gauze. The wounds were injected. The patient was then awakened and returned to PACU in stable and satisfactory condition. There were no complications.  Marchia Bond, MD Orthopedic Surgeon [phone]   01/09/2021 9:40 AM

## 2021-01-09 NOTE — Evaluation (Signed)
Physical Therapy Evaluation Patient Details Name: Tina Ponce MRN: [id number] DOB: 01-19-1966 Today's Date: 01/09/2021  History of Present Illness  Patient is 55 y.o. female s/p Lt THA posterolateral approach on 01/09/21 with PMH significant for OA, anxiety, depression, breast cancer, HLD, HTN, GERD, Rt THA in 2018.   Clinical Impression  UNICE VANTASSEL is a 55 y.o. female POD 0 s/p Lt THA. Patient reports independence with mobility at baseline. Patient is now limited by functional impairments (see PT problem list below) and requires min assist to supervision for transfers and gait with RW. Patient was able to ambulate ~90 feet with RW and min guard and cues for safe walker management. Patient educated on safe sequencing for stair mobility and verbalized safe guarding position for people assisting with mobility. Patient instructed in exercises to facilitate ROM and circulation. Patient will benefit from continued skilled PT interventions to address impairments and progress towards PLOF. Patient has met mobility goals at adequate level for discharge home; will continue to follow if pt continues acute stay to progress towards Mod I goals.        Recommendations for follow up therapy are one component of a multi-disciplinary discharge planning process, led by the attending physician.  Recommendations may be updated based on patient status, additional functional criteria and insurance authorization.  Follow Up Recommendations Follow physician's recommendations for discharge plan and follow up therapies    Assistance Recommended at Discharge Frequent or constant Supervision/Assistance  Functional Status Assessment Patient has had a recent decline in their functional status and demonstrates the ability to make significant improvements in function in a reasonable and predictable amount of time.  Equipment Recommendations  Rolling walker (2 wheels)    Recommendations for Other Services        Precautions / Restrictions Precautions Precautions: Fall;Posterior Hip Precaution Booklet Issued: Yes (comment) Restrictions Weight Bearing Restrictions: No Other Position/Activity Restrictions: WBAT      Mobility  Bed Mobility Overal bed mobility: Needs Assistance Bed Mobility: Supine to Sit     Supine to sit: Min guard;HOB elevated     General bed mobility comments: cues needed to maintain posterior hip precautions, pt taking extra time and HOB slightly elevated. pt has adjustable bed at home.    Transfers Overall transfer level: Needs assistance Equipment used: Rolling walker (2 wheels) Transfers: Sit to/from Stand Sit to Stand: Min assist;Min guard           General transfer comment: min guard from elevated surface at EOB and to sit in recliner. min assist for sequencing, and control with lowering to low toilet. cues needed to maintain posterior hip precautions.    Ambulation/Gait Ambulation/Gait assistance: Min guard;Supervision Gait Distance (Feet): 90 Feet Assistive device: Rolling walker (2 wheels) Gait Pattern/deviations: Step-to pattern Gait velocity: decr   General Gait Details: cues for step to pattern with RW. cues to maintain posterior hip precautions and avoid pivoting on Lt LE with turns.  Stairs Stairs: Yes Stairs assistance: Min guard Stair Management: Two rails;Step to pattern;Forwards Number of Stairs: 3 General stair comments: cues for sequencing "up with good, down with bad" and for safe guarding for family to provide.  Wheelchair Mobility    Modified Rankin (Stroke Patients Only)       Balance Overall balance assessment: Needs assistance Sitting-balance support: Feet supported Sitting balance-Leahy Scale: Good     Standing balance support: Reliant on assistive device for balance;During functional activity;Bilateral upper extremity supported Standing balance-Leahy Scale: Fair  Pertinent Vitals/Pain Pain Assessment: 0-10 Pain Score: 4  Pain Location: Lt hip Pain Descriptors / Indicators: Aching;Discomfort Pain Intervention(s): Limited activity within patient's tolerance;Monitored during session;Repositioned    Home Living Family/patient expects to be discharged to:: Private residence Living Arrangements: Children Available Help at Discharge: Family Type of Home: House Home Access: Stairs to enter Entrance Stairs-Rails: Can reach both;Left;Right Entrance Stairs-Number of Steps: 5   Home Layout: One level Home Equipment: Rollator (4 wheels);Cane - single point;BSC;Shower seat      Prior Function Prior Level of Function : Independent/Modified Independent                     Hand Dominance   Dominant Hand: Right    Extremity/Trunk Assessment   Upper Extremity Assessment Upper Extremity Assessment: Overall WFL for tasks assessed;LUE deficits/detail LUE Deficits / Details: Lt elbow fracture in June 2022 has some pain with weight bearing LUE Sensation: WNL LUE Coordination: WNL    Lower Extremity Assessment Lower Extremity Assessment: Overall WFL for tasks assessed;LLE deficits/detail LLE: Unable to fully assess due to pain;Unable to fully assess due to immobilization (posterior restrictions) LLE Sensation: WNL LLE Coordination: WNL    Cervical / Trunk Assessment Cervical / Trunk Assessment: Normal  Communication   Communication: No difficulties  Cognition Arousal/Alertness: Awake/alert Behavior During Therapy: WFL for tasks assessed/performed Overall Cognitive Status: Within Functional Limits for tasks assessed                                          General Comments      Exercises Total Joint Exercises Ankle Circles/Pumps: AROM;Both;20 reps Quad Sets: AROM;Left;Other reps (comment);Seated Short Arc Quad: AROM;Left;Other reps (comment);Seated Heel Slides: AROM;Left;Other reps (comment);Seated Hip  ABduction/ADduction: AROM;Left;Other reps (comment);Seated;PROM   Assessment/Plan    PT Assessment Patient needs continued PT services  PT Problem List Decreased strength;Decreased range of motion;Decreased balance;Decreased activity tolerance;Decreased mobility;Decreased knowledge of use of DME;Decreased knowledge of precautions;Pain       PT Treatment Interventions DME instruction;Gait training;Functional mobility training;Stair training;Therapeutic activities;Therapeutic exercise;Balance training;Patient/family education    PT Goals (Current goals can be found in the Care Plan section)  Acute Rehab PT Goals Patient Stated Goal: regain independence and go to [facility] PT Goal Formulation: With patient Time For Goal Achievement: 01/16/21 Potential to Achieve Goals: Good    Frequency 7X/week   Barriers to discharge        Co-evaluation               AM-PAC PT "6 Clicks" Mobility  Outcome Measure Help needed turning from your back to your side while in a flat bed without using bedrails?: A Little Help needed moving from lying on your back to sitting on the side of a flat bed without using bedrails?: A Little Help needed moving to and from a bed to a chair (including a wheelchair)?: A Little Help needed standing up from a chair using your arms (e.g., wheelchair or bedside chair)?: A Little Help needed to walk in hospital room?: A Little Help needed climbing 3-5 steps with a railing? : A Little 6 Click Score: 18    End of Session Equipment Utilized During Treatment: Gait belt Activity Tolerance: Patient tolerated treatment well Patient left: in chair;with call bell/phone within reach Nurse Communication: Mobility status PT Visit Diagnosis: Muscle weakness (generalized) (M62.81);Difficulty in walking, not elsewhere classified (R26.2)    Time:  2956-2130 PT Time Calculation (min) (ACUTE ONLY): 40 min   Charges:   PT Evaluation $PT Eval Low Complexity: 1 Low PT  Treatments $Gait Training: 8-22 mins $Therapeutic Exercise: 8-22 mins        Verner Mould, DPT Acute Rehabilitation Services Office [phone] Pager [phone]   Jacques Navy 01/09/2021, 2:54 PM

## 2021-01-09 NOTE — Interval H&P Note (Signed)
History and Physical Interval Note:  01/09/2021 7:13 AM  Tina Ponce  has presented today for surgery, with the diagnosis of djd left hip.  The various methods of treatment have been discussed with the patient and family. After consideration of risks, benefits and other options for treatment, the patient has consented to  Procedure(s): TOTAL HIP ARTHROPLASTY (Left) as a surgical intervention.  The patient's history has been reviewed, patient examined, no change in status, stable for surgery.  I have reviewed the patient's chart and labs.  Questions were answered to the patient's satisfaction.     Johnny Bridge

## 2021-01-09 NOTE — Transfer of Care (Signed)
Immediate Anesthesia Transfer of Care Note  Patient: Tina Ponce  Procedure(s) Performed: TOTAL HIP ARTHROPLASTY (Left: Hip)  Patient Location: PACU  Anesthesia Type:Spinal  Level of Consciousness: awake, alert  and oriented  Airway & Oxygen Therapy: Patient Spontanous Breathing and Patient connected to face mask oxygen  Post-op Assessment: Report given to RN and Post -op Vital signs reviewed and stable  Post vital signs: Reviewed and stable  Last Vitals:  Vitals Value Taken Time  BP 120/72 01/09/21 1011  Temp    Pulse 60 01/09/21 1013  Resp 18 01/09/21 1013  SpO2 98 % 01/09/21 1013  Vitals shown include unvalidated device data.  Last Pain:  Vitals:   01/09/21 0538  TempSrc:   PainSc: 8       Patients Stated Pain Goal: 3 (79/03/83 3383)  Complications: No notable events documented.

## 2021-01-10 ENCOUNTER — Encounter (HOSPITAL_COMMUNITY): Payer: Self-pay | Admitting: Orthopedic Surgery

## 2021-01-11 DIAGNOSIS — M25552 Pain in left hip: Secondary | ICD-10-CM | POA: Diagnosis not present

## 2021-01-11 DIAGNOSIS — R262 Difficulty in walking, not elsewhere classified: Secondary | ICD-10-CM | POA: Diagnosis not present

## 2021-01-11 DIAGNOSIS — M6281 Muscle weakness (generalized): Secondary | ICD-10-CM | POA: Diagnosis not present

## 2021-01-11 DIAGNOSIS — M1612 Unilateral primary osteoarthritis, left hip: Secondary | ICD-10-CM | POA: Diagnosis not present

## 2021-01-11 DIAGNOSIS — Z96642 Presence of left artificial hip joint: Secondary | ICD-10-CM | POA: Diagnosis not present

## 2021-01-22 DIAGNOSIS — M1612 Unilateral primary osteoarthritis, left hip: Secondary | ICD-10-CM | POA: Diagnosis not present

## 2021-02-26 DIAGNOSIS — Z1231 Encounter for screening mammogram for malignant neoplasm of breast: Secondary | ICD-10-CM | POA: Diagnosis not present

## 2021-05-14 ENCOUNTER — Other Ambulatory Visit: Payer: Self-pay

## 2021-05-14 ENCOUNTER — Ambulatory Visit
Admission: RE | Admit: 2021-05-14 | Discharge: 2021-05-14 | Disposition: A | Discharge status: Home / Self Care | Payer: Medicare Other | Source: Ambulatory Visit | Attending: Family Medicine | Admitting: Family Medicine

## 2021-05-14 ENCOUNTER — Other Ambulatory Visit: Payer: Self-pay | Admitting: Family Medicine

## 2021-05-14 DIAGNOSIS — R053 Chronic cough: Secondary | ICD-10-CM

## 2021-05-14 DIAGNOSIS — H40033 Anatomical narrow angle, bilateral: Secondary | ICD-10-CM | POA: Diagnosis not present

## 2021-05-14 DIAGNOSIS — R350 Frequency of micturition: Secondary | ICD-10-CM | POA: Diagnosis not present

## 2021-05-14 DIAGNOSIS — G8929 Other chronic pain: Secondary | ICD-10-CM | POA: Diagnosis not present

## 2021-05-14 DIAGNOSIS — F5101 Primary insomnia: Secondary | ICD-10-CM | POA: Diagnosis not present

## 2021-05-14 DIAGNOSIS — H2513 Age-related nuclear cataract, bilateral: Secondary | ICD-10-CM | POA: Diagnosis not present

## 2021-05-14 DIAGNOSIS — I10 Essential (primary) hypertension: Secondary | ICD-10-CM | POA: Diagnosis not present

## 2021-05-14 DIAGNOSIS — E559 Vitamin D deficiency, unspecified: Secondary | ICD-10-CM | POA: Diagnosis not present

## 2021-05-14 DIAGNOSIS — E7800 Pure hypercholesterolemia, unspecified: Secondary | ICD-10-CM | POA: Diagnosis not present

## 2021-05-14 DIAGNOSIS — R7303 Prediabetes: Secondary | ICD-10-CM | POA: Diagnosis not present

## 2021-07-24 DIAGNOSIS — H109 Unspecified conjunctivitis: Secondary | ICD-10-CM | POA: Diagnosis not present

## 2021-08-15 DIAGNOSIS — M129 Arthropathy, unspecified: Secondary | ICD-10-CM | POA: Diagnosis not present

## 2021-08-15 DIAGNOSIS — I447 Left bundle-branch block, unspecified: Secondary | ICD-10-CM | POA: Diagnosis not present

## 2021-08-15 DIAGNOSIS — G4733 Obstructive sleep apnea (adult) (pediatric): Secondary | ICD-10-CM | POA: Diagnosis not present

## 2021-08-15 DIAGNOSIS — R5383 Other fatigue: Secondary | ICD-10-CM | POA: Diagnosis not present

## 2021-08-17 ENCOUNTER — Telehealth: Payer: Self-pay | Admitting: *Deleted

## 2021-08-17 NOTE — Telephone Encounter (Signed)
   Pre-operative Risk Assessment    Patient Name: Tina Ponce  DOB: 12-17-1965 MRN: [id number]      Request for Surgical Clearance    Procedure:   SLEEVE GASTRECTOMY   Date of Surgery:  Clearance TBD                                 Surgeon:  DR. Gurney Maxin Surgeon's Group or Practice Name:  [facility] Phone number:  [phone] Fax number:  [phone] ATTN: Emeline Gins, CMA   Type of Clearance Requested:   - Medical ; PER CLEARANCE FORM NO MEDICATIONS ARE LISTED TO BE HELD   Type of Anesthesia:  General    Additional requests/questions:    Jiles Prows   08/17/2021, 10:38 AM

## 2021-08-19 ENCOUNTER — Emergency Department (HOSPITAL_COMMUNITY)
Admission: EM | Admit: 2021-08-19 | Discharge: 2021-08-20 | Discharge status: Against Medical Advice | Payer: Medicare Other | Attending: Physician Assistant | Admitting: Physician Assistant

## 2021-08-19 ENCOUNTER — Other Ambulatory Visit: Payer: Self-pay

## 2021-08-19 ENCOUNTER — Encounter (HOSPITAL_COMMUNITY): Payer: Self-pay

## 2021-08-19 DIAGNOSIS — R42 Dizziness and giddiness: Secondary | ICD-10-CM | POA: Insufficient documentation

## 2021-08-19 DIAGNOSIS — I10 Essential (primary) hypertension: Secondary | ICD-10-CM | POA: Diagnosis not present

## 2021-08-19 DIAGNOSIS — H5711 Ocular pain, right eye: Secondary | ICD-10-CM | POA: Insufficient documentation

## 2021-08-19 DIAGNOSIS — Z91040 Latex allergy status: Secondary | ICD-10-CM | POA: Diagnosis not present

## 2021-08-19 DIAGNOSIS — H53143 Visual discomfort, bilateral: Secondary | ICD-10-CM | POA: Insufficient documentation

## 2021-08-19 DIAGNOSIS — Z5321 Procedure and treatment not carried out due to patient leaving prior to being seen by health care provider: Secondary | ICD-10-CM | POA: Diagnosis not present

## 2021-08-19 DIAGNOSIS — Z7982 Long term (current) use of aspirin: Secondary | ICD-10-CM | POA: Diagnosis not present

## 2021-08-19 DIAGNOSIS — H53149 Visual discomfort, unspecified: Secondary | ICD-10-CM | POA: Diagnosis not present

## 2021-08-19 DIAGNOSIS — R079 Chest pain, unspecified: Secondary | ICD-10-CM | POA: Diagnosis not present

## 2021-08-19 DIAGNOSIS — R0789 Other chest pain: Secondary | ICD-10-CM | POA: Diagnosis not present

## 2021-08-19 LAB — BASIC METABOLIC PANEL
Anion gap: 8 (ref 5–15)
BUN: 11 mg/dL (ref 6–20)
CO2: 23 mmol/L (ref 22–32)
Calcium: 9 mg/dL (ref 8.9–10.3)
Chloride: 107 mmol/L (ref 98–111)
Creatinine, Ser: 0.93 mg/dL (ref 0.44–1.00)
GFR, Estimated: >60 mL/min (ref >60)
Glucose, Bld: 99 mg/dL (ref 70–99)
Potassium: 3.7 mmol/L (ref 3.5–5.1)
Sodium: 138 mmol/L (ref 135–145)

## 2021-08-19 LAB — CBC WITH DIFFERENTIAL/PLATELET
Abs Immature Granulocytes: 0.02 10*3/uL (ref 0.00–0.07)
Basophils Absolute: 0 10*3/uL (ref 0.0–0.1)
Basophils Relative: 1 %
Eosinophils Absolute: 0.2 10*3/uL (ref 0.0–0.5)
Eosinophils Relative: 3 %
HCT: 36.2 % (ref 36.0–46.0)
Hemoglobin: 11.4 g/dL — ABNORMAL LOW (ref 12.0–15.0)
Immature Granulocytes: 0 %
Lymphocytes Relative: 35 %
Lymphs Abs: 2.8 10*3/uL (ref 0.7–4.0)
MCH: 25.1 pg — ABNORMAL LOW (ref 26.0–34.0)
MCHC: 31.5 g/dL (ref 30.0–36.0)
MCV: 79.7 fL — ABNORMAL LOW (ref 80.0–100.0)
Monocytes Absolute: 0.5 10*3/uL (ref 0.1–1.0)
Monocytes Relative: 6 %
Neutro Abs: 4.6 10*3/uL (ref 1.7–7.7)
Neutrophils Relative %: 55 %
Platelets: 220 10*3/uL (ref 150–400)
RBC: 4.54 MIL/uL (ref 3.87–5.11)
RDW: 16.6 % — ABNORMAL HIGH (ref 11.5–15.5)
WBC: 8.1 10*3/uL (ref 4.0–10.5)
nRBC: 0 % (ref 0.0–0.2)

## 2021-08-19 NOTE — ED Provider Triage Note (Signed)
Emergency Medicine Provider Triage Evaluation Note  Tina Ponce , a 56 y.o. female  was evaluated in triage.  Pt complains of photophobia x today.  Noted her blood pressure to be elevated over the last couple days.  Feels like there is something around her right eye, she is unable to keep her eyes open.  States the light makes is worse.  Feels somewhat disoriented, lightheaded.  Does have a history of migraines but this feels different.  No trauma.  Review of Systems  Positive: Photophobia,  Negative: Headache, nausea, vision  Physical Exam  BP (!) 155/113 (BP Location: Left Arm)   Pulse 93   Temp 98.4 F (36.9 C) (Oral)   Resp 18   Ht '5\' 8"'$  (1.727 m)   Wt 108.9 kg   SpO2 98%   BMI 36.49 kg/m  Gen:   Awake, no distress   Resp:  Normal effort  MSK:   Moves extremities without difficulty  Other:  No loss of vision, patient is able to open her eyes, there is continuous watering, both look injected.  No palpable pain around her head.  Medical Decision Making  Medically screening exam initiated at 5:22 PM.  Appropriate orders placed.  DELLIE PIASECKI was informed that the remainder of the evaluation will be completed by another provider, this initial triage assessment does not replace that evaluation, and the importance of remaining in the ED until their evaluation is complete.  Elevated blood pressure.  Autophobia.   Janeece Fitting, PA-C 08/19/21 1728

## 2021-08-19 NOTE — ED Notes (Signed)
Patient called for vitals x2 with no response and not visible in the lobby

## 2021-08-19 NOTE — ED Triage Notes (Signed)
Reports unable to open eyes bc they are so sensitive to light and watery due to her BP being so high.  Denies headache.

## 2021-08-20 ENCOUNTER — Emergency Department (HOSPITAL_BASED_OUTPATIENT_CLINIC_OR_DEPARTMENT_OTHER)
Admission: EM | Admit: 2021-08-20 | Discharge: 2021-08-20 | Disposition: A | Discharge status: Home / Self Care | Payer: Medicare Other | Source: Home / Self Care | Attending: Emergency Medicine | Admitting: Emergency Medicine

## 2021-08-20 ENCOUNTER — Encounter (HOSPITAL_BASED_OUTPATIENT_CLINIC_OR_DEPARTMENT_OTHER): Payer: Self-pay | Admitting: Emergency Medicine

## 2021-08-20 ENCOUNTER — Emergency Department (HOSPITAL_BASED_OUTPATIENT_CLINIC_OR_DEPARTMENT_OTHER): Payer: Medicare Other

## 2021-08-20 DIAGNOSIS — Z7982 Long term (current) use of aspirin: Secondary | ICD-10-CM | POA: Insufficient documentation

## 2021-08-20 DIAGNOSIS — R079 Chest pain, unspecified: Secondary | ICD-10-CM | POA: Diagnosis not present

## 2021-08-20 DIAGNOSIS — I10 Essential (primary) hypertension: Secondary | ICD-10-CM | POA: Insufficient documentation

## 2021-08-20 DIAGNOSIS — Z91040 Latex allergy status: Secondary | ICD-10-CM | POA: Insufficient documentation

## 2021-08-20 DIAGNOSIS — H5711 Ocular pain, right eye: Secondary | ICD-10-CM | POA: Insufficient documentation

## 2021-08-20 DIAGNOSIS — R42 Dizziness and giddiness: Secondary | ICD-10-CM | POA: Diagnosis not present

## 2021-08-20 DIAGNOSIS — R0789 Other chest pain: Secondary | ICD-10-CM | POA: Diagnosis not present

## 2021-08-20 LAB — BASIC METABOLIC PANEL
Anion gap: 10 (ref 5–15)
BUN: 11 mg/dL (ref 6–20)
CO2: 24 mmol/L (ref 22–32)
Calcium: 9.7 mg/dL (ref 8.9–10.3)
Chloride: 105 mmol/L (ref 98–111)
Creatinine, Ser: 0.83 mg/dL (ref 0.44–1.00)
GFR, Estimated: >60 mL/min (ref >60)
Glucose, Bld: 109 mg/dL — ABNORMAL HIGH (ref 70–99)
Potassium: 4 mmol/L (ref 3.5–5.1)
Sodium: 139 mmol/L (ref 135–145)

## 2021-08-20 LAB — CBC
HCT: 38.3 % (ref 36.0–46.0)
Hemoglobin: 12.3 g/dL (ref 12.0–15.0)
MCH: 24.9 pg — ABNORMAL LOW (ref 26.0–34.0)
MCHC: 32.1 g/dL (ref 30.0–36.0)
MCV: 77.7 fL — ABNORMAL LOW (ref 80.0–100.0)
Platelets: 242 10*3/uL (ref 150–400)
RBC: 4.93 MIL/uL (ref 3.87–5.11)
RDW: 16.8 % — ABNORMAL HIGH (ref 11.5–15.5)
WBC: 8.4 10*3/uL (ref 4.0–10.5)
nRBC: 0 % (ref 0.0–0.2)

## 2021-08-20 LAB — TROPONIN I (HIGH SENSITIVITY)
Troponin I (High Sensitivity): 5 ng/L (ref <18)
Troponin I (High Sensitivity): 5 ng/L (ref <18)

## 2021-08-20 MED ORDER — AMLODIPINE BESYLATE 5 MG PO TABS
5.0000 mg | ORAL_TABLET | Freq: Once | ORAL | Status: DC
  Filled 2021-08-20: qty 1

## 2021-08-20 MED ORDER — FLUORESCEIN SODIUM 1 MG OP STRP
1.0000 | ORAL_STRIP | Freq: Once | OPHTHALMIC | Status: AC
  Administered 2021-08-20: 1 via OPHTHALMIC
  Filled 2021-08-20: qty 1

## 2021-08-20 MED ORDER — TETRACAINE HCL 0.5 % OP SOLN
2.0000 [drp] | Freq: Once | OPHTHALMIC | Status: AC
  Administered 2021-08-20: 2 [drp] via OPHTHALMIC
  Filled 2021-08-20: qty 4

## 2021-08-20 NOTE — ED Notes (Signed)
Pt called triage nurse to report she just started having chest pain. Pt brought back in for EKG

## 2021-08-20 NOTE — ED Notes (Signed)
Patient transported to X-ray 

## 2021-08-20 NOTE — Telephone Encounter (Signed)
Primary Cardiologist:Mahesh A Chandrasekhar, MD  Chart reviewed as part of pre-operative protocol coverage. Because of Shamyra D Spychalski's past medical history and time since last visit, he/she will require a follow-up visit in order to better assess preoperative cardiovascular risk.  Pre-op covering staff: - Please schedule appointment and call patient to inform them. - Please contact requesting surgeon's office via preferred method (i.e, phone, fax) to inform them of need for appointment prior to surgery.  If applicable, this message will also be routed to pharmacy pool and/or primary cardiologist for input on holding anticoagulant/antiplatelet agent as requested below so that this information is available at time of patient's appointment.   Emmaline Life, NP-C    08/20/2021, 1:13 PM [place] [address] Office [phone] Fax [phone]

## 2021-08-20 NOTE — Telephone Encounter (Signed)
Tried to reach the pt to set up tele pre op appt, though vm is full could not leave message for call back

## 2021-08-20 NOTE — Discharge Instructions (Addendum)
You can increase your Micardis to 40 mg a day if your blood pressure remains elevated.  Follow-up with your primary doctor to discuss further treatment of your blood pressure.  Follow-up with the eye doctor for further evaluation of your eye discomfort

## 2021-08-20 NOTE — ED Notes (Signed)
Pt began having chest pain in waiting area, RN made aware.

## 2021-08-20 NOTE — ED Triage Notes (Signed)
States it feels like something is in her R eye. Was seen at [facility] [address] last night but left before being seen. Also concerned because her BP has been running high. States she is compliant with her BP medication.

## 2021-08-20 NOTE — ED Provider Notes (Signed)
[place] EMERGENCY DEPARTMENT Provider Note   CSN: [id number] Arrival date & time: 08/20/21  5784     History  Chief Complaint  Patient presents with   Eye Problem   Chest Pain    Tina Ponce is a 56 y.o. female.   Eye Problem Chest Pain   Patient has a history of hypertension, anxiety, hyperlipidemia, GERD, osteoarthritis.  Patient initially states she started having discomfort in her right eye.  Patient feels like there might be something underneath the lid.  Patient states she then started having photophobia.  Patient states that started last night.  Symptoms persisted this morning.  She has not had any injuries.  No drainage.  While she was in the waiting room she had 15-minute episode of chest discomfort .  Felt like she had to burp.  Symptoms have all resolved.  She is not having any fevers or chills.  No cough  Home Medications Prior to Admission medications   Medication Sig Start Date End Date Taking? Authorizing Provider  acetaminophen (TYLENOL) 325 MG tablet Take 2 tablets (650 mg total) by mouth every 4 (four) hours as needed for moderate pain or mild pain. Eight times daily 01/09/21   Merlene Pulling K, PA-C  ALPRAZolam Duanne Moron) 0.5 MG tablet Take 0.5 mg by mouth 2 (two) times daily.    [provider]  amitriptyline (ELAVIL) 25 MG tablet Take 25 mg by mouth at bedtime.    [provider]  aspirin EC 325 MG tablet Take 1 tablet (325 mg total) by mouth 2 (two) times daily. 01/09/21   Ventura Bruns, PA-C  baclofen (LIORESAL) 10 MG tablet Take 1 tablet (10 mg total) by mouth 3 (three) times daily as needed for muscle spasms. 01/09/21   Ventura Bruns, PA-C  CALCIUM PO Take 1 tablet by mouth daily.    [provider]  Cholecalciferol (VITAMIN D3) 50 MCG (2000 UT) TABS Take 2,000 Units by mouth in the morning and at bedtime.    [provider]  famotidine (PEPCID) 20 MG tablet Take 20 mg by mouth as needed for heartburn or  indigestion.    [provider]  magnesium oxide (MAG-OX) 400 MG tablet Take 400 mg by mouth daily.    [provider]  neomycin-bacitracin-polymyxin (NEOSPORIN) ointment Apply 1 application topically daily as needed (Rash).    [provider]  oxyCODONE (ROXICODONE) 5 MG immediate release tablet Take 1 tablet (5 mg total) by mouth every 4 (four) hours as needed for severe pain. 01/09/21   Ventura Bruns, PA-C  promethazine (PHENERGAN) 25 MG tablet Take 25 mg by mouth 4 (four) times daily as needed for migraine. 11/16/20   [provider]  sennosides-docusate sodium (SENOKOT-S) 8.6-50 MG tablet Take 2 tablets by mouth daily. 01/09/21   Merlene Pulling K, PA-C  sertraline (ZOLOFT) 100 MG tablet Take 100 mg by mouth 2 (two) times daily.     [provider]  SUMAtriptan (IMITREX) 100 MG tablet Take 100 mg by mouth every 2 (two) hours as needed for migraine. May repeat in 2 hours if headache persists or recurs.    [provider]      Allergies    Avelox [moxifloxacin hcl in nacl], Codeine, Moxifloxacin, Flagyl [metronidazole], Sulfamethoxazole-trimethoprim, and Latex    Review of Systems   Review of Systems  Cardiovascular:  Positive for chest pain.    Physical Exam Updated Vital Signs BP (!) 156/88   Pulse 81  Temp 98.8 F (37.1 C) (Oral)   Resp (!) 23   SpO2 99%  Physical Exam Vitals and nursing note reviewed.  Constitutional:      General: She is not in acute distress.    Appearance: She is well-developed.  HENT:     Head: Normocephalic and atraumatic.     Right Ear: External ear normal.     Left Ear: External ear normal.  Eyes:     General: Lids are everted, no foreign bodies appreciated. Vision grossly intact. No scleral icterus.       Right eye: No foreign body or discharge.        Left eye: No foreign body or discharge.     Extraocular Movements: Extraocular movements intact.     Right eye: Normal extraocular motion.      Left eye: Normal extraocular motion.     Conjunctiva/sclera: Conjunctivae normal.     Right eye: Right conjunctiva is not injected. No exudate.    Left eye: Left conjunctiva is not injected. No exudate.    Pupils: Pupils are equal, round, and reactive to light.  Neck:     Trachea: No tracheal deviation.  Cardiovascular:     Rate and Rhythm: Normal rate and regular rhythm.  Pulmonary:     Effort: Pulmonary effort is normal. No respiratory distress.     Breath sounds: Normal breath sounds. No stridor. No wheezing or rales.  Abdominal:     General: Bowel sounds are normal. There is no distension.     Palpations: Abdomen is soft.     Tenderness: There is no abdominal tenderness. There is no guarding or rebound.  Musculoskeletal:        General: No tenderness or deformity.     Cervical back: Neck supple.  Skin:    General: Skin is warm and dry.     Findings: No rash.  Neurological:     General: No focal deficit present.     Mental Status: She is alert.     Cranial Nerves: No cranial nerve deficit (no facial droop, extraocular movements intact, no slurred speech).     Sensory: No sensory deficit.     Motor: No abnormal muscle tone or seizure activity.     Coordination: Coordination normal.  Psychiatric:        Mood and Affect: Mood normal.     ED Results / Procedures / Treatments   Labs (all labs ordered are listed, but only abnormal results are displayed) Labs Reviewed  BASIC METABOLIC PANEL - Abnormal; Notable for the following components:      Result Value   Glucose, Bld 109 (*)    All other components within normal limits  CBC - Abnormal; Notable for the following components:   MCV 77.7 (*)    MCH 24.9 (*)    RDW 16.8 (*)    All other components within normal limits  TROPONIN I (HIGH SENSITIVITY)  TROPONIN I (HIGH SENSITIVITY)    EKG EKG Interpretation  Date/Time:  Monday August 20 2021 11:49:57 EDT Ventricular Rate:  79 PR Interval:  148 QRS Duration: 138 QT  Interval:  438 QTC Calculation: 502 R Axis:   55 Text Interpretation: Normal sinus rhythm Non-specific intra-ventricular conduction block Abnormal ECG When compared with ECG of 19-Aug-2020 15:42, Since last tracing rate slower Confirmed by Dorie Rank [phone]) on 08/20/2021 12:01:02 PM  Radiology DG Chest 2 View  Result Date: 08/20/2021 CLINICAL DATA:  Chest pain with dizziness. History of breast cancer, hypertension  and asthma. EXAM: CHEST - 2 VIEW COMPARISON:  Radiographs 05/14/2021 and 02/10/2017. FINDINGS: The heart size and mediastinal contours are stable. There is mild aortic atherosclerosis. The lungs are clear. No pleural effusion or pneumothorax. Postsurgical changes are present within the right breast and right axilla. There are mild degenerative changes in the spine. No acute osseous findings are evident. IMPRESSION: Stable postoperative chest. No evidence of acute cardiopulmonary process. Electronically Signed   By: Richardean Sale M.D.   On: 08/20/2021 12:30    Procedures Procedures    Medications Ordered in ED Medications  amLODipine (NORVASC) tablet 5 mg (has no administration in time range)  tetracaine (PONTOCAINE) 0.5 % ophthalmic solution 2 drop (2 drops Left Eye Given by Other 08/20/21 1310)  fluorescein ophthalmic strip 1 strip (1 strip Left Eye Given by Other 08/20/21 1310)    ED Course/ Medical Decision Making/ A&P Clinical Course as of 08/20/21 1519  Mon Aug 20, 2021  1237 DG Chest 2 View Chest x-ray images and radiology report reviewed.  No acute findings [JK]    Clinical Course User Index [JK] Dorie Rank, MD                           Medical Decision Making Problems Addressed: Chest pain, unspecified type: complicated acute illness or injury Hypertension, unspecified type: complicated acute illness or injury Pain of right eye: complicated acute illness or injury  Amount and/or Complexity of Data Reviewed Labs: ordered. Radiology: ordered. Decision-making  details documented in ED Course.  Risk Prescription drug management.   Patient presented to ED with a primary complaint of eye discomfort.  Patient felt like she had something in her eye.  No signs to suggest conjunctivitis.  Patient was examined lids were everted.  No foreign body noted.  Fluorescein evaluation does not show any signs of corneal abrasion.  Patient's eye was flushed and her symptoms have improved.  We will have her follow-up with ophthalmology.  Patient also complained of some chest pain while she was in the waiting room.  ED work-up is reassuring.  Doubt cardiac ischemia.  Presentation is atypical.  No complaints of shortness of breath to suggest PE.  Patient's blood pressure was noted to be elevated.  We will have her follow-up with her primary care doctor to review her blood pressure        Final Clinical Impression(s) / ED Diagnoses Final diagnoses:  Hypertension, unspecified type  Chest pain, unspecified type  Pain of right eye    Rx / DC Orders ED Discharge Orders     None         Dorie Rank, MD 08/20/21 1519

## 2021-08-21 ENCOUNTER — Other Ambulatory Visit (HOSPITAL_COMMUNITY): Payer: Self-pay | Admitting: General Surgery

## 2021-08-21 ENCOUNTER — Other Ambulatory Visit: Payer: Self-pay | Admitting: General Surgery

## 2021-08-21 DIAGNOSIS — H2000 Unspecified acute and subacute iridocyclitis: Secondary | ICD-10-CM | POA: Diagnosis not present

## 2021-08-21 DIAGNOSIS — G4733 Obstructive sleep apnea (adult) (pediatric): Secondary | ICD-10-CM

## 2021-08-23 NOTE — Telephone Encounter (Signed)
I s/w the pt and she tells me that her surgery is probably not going to be until Dec 2023. Pt asked for appt to be in October incase her surgery gets moved up some. Pt has been scheduled to see Dr. Gasper Sells 12/11/21 @ 9:20. Pt thanked me for the help.   I will update the requesting office the pt has appt

## 2021-08-29 ENCOUNTER — Ambulatory Visit (HOSPITAL_COMMUNITY)
Admission: RE | Admit: 2021-08-29 | Discharge: 2021-08-29 | Disposition: A | Discharge status: Home / Self Care | Payer: Medicare Other | Source: Ambulatory Visit | Attending: General Surgery | Admitting: General Surgery

## 2021-08-29 DIAGNOSIS — G4733 Obstructive sleep apnea (adult) (pediatric): Secondary | ICD-10-CM

## 2021-08-29 DIAGNOSIS — E66812 Obesity, class 2: Secondary | ICD-10-CM

## 2021-09-05 DIAGNOSIS — H2513 Age-related nuclear cataract, bilateral: Secondary | ICD-10-CM | POA: Diagnosis not present

## 2021-09-05 DIAGNOSIS — H43393 Other vitreous opacities, bilateral: Secondary | ICD-10-CM | POA: Diagnosis not present

## 2021-09-05 DIAGNOSIS — G43109 Migraine with aura, not intractable, without status migrainosus: Secondary | ICD-10-CM | POA: Diagnosis not present

## 2021-09-05 DIAGNOSIS — H2000 Unspecified acute and subacute iridocyclitis: Secondary | ICD-10-CM | POA: Diagnosis not present

## 2021-09-13 DIAGNOSIS — Z780 Asymptomatic menopausal state: Secondary | ICD-10-CM | POA: Diagnosis not present

## 2021-09-17 ENCOUNTER — Encounter: Payer: Medicare Other | Attending: Family Medicine | Admitting: Dietician

## 2021-09-17 ENCOUNTER — Encounter: Payer: Self-pay | Admitting: Dietician

## 2021-09-17 DIAGNOSIS — R5383 Other fatigue: Secondary | ICD-10-CM | POA: Insufficient documentation

## 2021-09-17 DIAGNOSIS — Z6836 Body mass index (BMI) 36.0-36.9, adult: Secondary | ICD-10-CM | POA: Diagnosis not present

## 2021-09-17 DIAGNOSIS — I10 Essential (primary) hypertension: Secondary | ICD-10-CM | POA: Insufficient documentation

## 2021-09-17 DIAGNOSIS — Z713 Dietary counseling and surveillance: Secondary | ICD-10-CM | POA: Insufficient documentation

## 2021-09-17 DIAGNOSIS — G4733 Obstructive sleep apnea (adult) (pediatric): Secondary | ICD-10-CM | POA: Insufficient documentation

## 2021-09-17 DIAGNOSIS — M129 Arthropathy, unspecified: Secondary | ICD-10-CM | POA: Diagnosis not present

## 2021-09-17 DIAGNOSIS — J45909 Unspecified asthma, uncomplicated: Secondary | ICD-10-CM | POA: Insufficient documentation

## 2021-09-17 DIAGNOSIS — C799 Secondary malignant neoplasm of unspecified site: Secondary | ICD-10-CM | POA: Insufficient documentation

## 2021-09-17 DIAGNOSIS — E669 Obesity, unspecified: Secondary | ICD-10-CM

## 2021-09-17 NOTE — Progress Notes (Addendum)
Nutrition Assessment for Bariatric Surgery Medical Nutrition Therapy Appt Start Time: 9:25    End Time: 10:18  Patient was seen on 09/17/2021 for Pre-Operative Nutrition Assessment. Letter of approval faxed to [facility] bariatric surgery program coordinator on 09/17/2021.   Referral stated Supervised Weight Loss (SWL) visits needed: 6  Planned surgery: Sleeve Gastrectomy  Pt expectation of surgery: 80-100 lb wt loss, and to be healthier    NUTRITION ASSESSMENT   Anthropometrics  Start weight at [facility]: 236.6 lbs (date: 09/17/2021)  Height: 69 in BMI: 34.94 kg/m2     Clinical  Medical hx: Asthma, cancer, HTN, obesity, sleep apnea Medications: Sertraline, meloxicam, alprazolam, telmisartan, baclofen, tramadol, amitriptyline, sumatriptan  Labs: glucose 109 Notable signs/symptoms: none noted Any previous deficiencies? No  Micronutrient Nutrition Focused Physical Exam: Hair: No issues observed Eyes: No issues observed Mouth: No issues observed Neck: No issues observed Nails: No issues observed Skin: No issues observed  Lifestyle & Dietary Hx  Patient states she lives alone. The pt performs the food shopping and the pt prepares the meals. She reports that she typically skips or misses 0 out of 21 possible meals per week. She may have 6 meals per week that are take-out or at a restaurant.  Patient states she does not work (disabled). She denies binge eating though has felt shame and/or guilt after eating too much food.  She denies having used laxatives or vomiting to facilitate weight loss. She admits to emotional eating during times of stress. She states that she knows the difference between hunger and thirst and can tell when she is full or satisfied and knows the difference. Pt states she tries not to eat after 8pm. Pt states she has already cut out sodas, in preparation for surgery. Pt states she can do water activities, because of her hip replacements, stating she  was told by her orthopedic surgeon not to use a treadmill.  24-Hr Dietary Recall First Meal: eggs or cereal Snack:  Second Meal: salad with fruit Snack:  Third Meal: protein and vegetables and rice or potatoes Snack: nuts or ice cream Beverages: sweet tea, water, water with flavorings, fruit juice, coffee   Estimated Energy Needs Calories: 1500   NUTRITION DIAGNOSIS  Overweight/obesity ([place]-3.3) related to past poor dietary habits and physical inactivity as evidenced by patient w/ planned Sleeve Gastrectomy surgery following dietary guidelines for continued weight loss.    NUTRITION INTERVENTION  Nutrition counseling (C-1) and education (E-2) to facilitate bariatric surgery goals.  Educated pt on micronutrient deficiencies post surgery and strategies to mitigate that risk   Pre-Op Goals Reviewed with the Patient Track food and beverage intake (pen and paper, MyFitness Pal, Baritastic app, etc.) Make healthy food choices while monitoring portion sizes Consume 3 meals per day or try to eat every 3-5 hours Avoid concentrated sugars and fried foods Keep sugar & fat in the single digits per serving on food labels Practice CHEWING your food (aim for applesauce consistency) Practice not drinking 15 minutes before, during, and 30 minutes after each meal and snack Avoid all carbonated beverages (ex: soda, sparkling beverages)  Limit caffeinated beverages (ex: coffee, tea, energy drinks) Avoid all sugar-sweetened beverages (ex: regular soda, sports drinks)  Avoid alcohol  Aim for 64-100 ounces of FLUID daily (with at least half of fluid intake being plain water)  Aim for at least 60-80 grams of PROTEIN daily Look for a liquid protein source that contains ?15 g protein and ?5 g carbohydrate (ex: shakes, drinks, shots) Make  a list of non-food related activities Physical activity is an important part of a healthy lifestyle so keep it moving! The goal is to reach 150 minutes of exercise per  week, including cardiovascular and weight baring activity.  *Goals that are bolded indicate the pt would like to start working towards these  Handouts Provided Include  Bariatric Surgery handouts (Nutrition Visits, Pre-Op Goals, Protein Shakes, Vitamins & Minerals)  Learning Style & Readiness for Change Teaching method utilized: Visual & Auditory  Demonstrated degree of understanding via: Teach Back  Readiness Level: Preparation Barriers to learning/adherence to lifestyle change: none identified  RD's Notes for Next Visit Progress toward patient chosen goals.     MONITORING & EVALUATION Dietary intake, weekly physical activity, body weight, and pre-op goals reached at next nutrition visit.    Next Steps  Patient is to follow up at [facility] for SWL visit.

## 2021-10-05 DIAGNOSIS — I10 Essential (primary) hypertension: Secondary | ICD-10-CM | POA: Diagnosis not present

## 2021-10-05 DIAGNOSIS — R7303 Prediabetes: Secondary | ICD-10-CM | POA: Diagnosis not present

## 2021-10-05 DIAGNOSIS — Z853 Personal history of malignant neoplasm of breast: Secondary | ICD-10-CM | POA: Diagnosis not present

## 2021-10-15 ENCOUNTER — Encounter: Payer: Self-pay | Admitting: Dietician

## 2021-10-15 ENCOUNTER — Encounter: Payer: Medicare Other | Attending: Family Medicine | Admitting: Dietician

## 2021-10-15 DIAGNOSIS — G4733 Obstructive sleep apnea (adult) (pediatric): Secondary | ICD-10-CM | POA: Diagnosis not present

## 2021-10-15 DIAGNOSIS — M129 Arthropathy, unspecified: Secondary | ICD-10-CM | POA: Diagnosis not present

## 2021-10-15 DIAGNOSIS — Z713 Dietary counseling and surveillance: Secondary | ICD-10-CM | POA: Diagnosis not present

## 2021-10-15 DIAGNOSIS — R5383 Other fatigue: Secondary | ICD-10-CM | POA: Insufficient documentation

## 2021-10-15 DIAGNOSIS — E669 Obesity, unspecified: Secondary | ICD-10-CM | POA: Insufficient documentation

## 2021-10-15 DIAGNOSIS — I10 Essential (primary) hypertension: Secondary | ICD-10-CM | POA: Insufficient documentation

## 2021-10-15 DIAGNOSIS — Z6835 Body mass index (BMI) 35.0-35.9, adult: Secondary | ICD-10-CM | POA: Diagnosis not present

## 2021-10-15 NOTE — Progress Notes (Addendum)
Supervised Weight Loss Visit Bariatric Nutrition Education Appt Start Time: 9:30    End Time: 10:02  Planned Surgery: Sleeve   1 out of 6 SWL Appointments   NUTRITION ASSESSMENT  Anthropometrics  Start weight at [facility]: 236.6 lbs (date: 09/17/2021)  Height: 69 in Weight today: 242.9 lbs. BMI: 35.87 kg/m2     Clinical  Medical hx: Asthma, cancer, HTN, obesity, sleep apnea Medications: Sertraline, meloxicam, alprazolam, telmisartan, baclofen, tramadol, amitriptyline, sumatriptan  Labs: glucose 109 Notable signs/symptoms: none noted Any previous deficiencies? No  Lifestyle & Dietary Hx  Pt states water is still tough to drink for her.  Pt sates she can down a 4 oz bottle of water, but if it is any larger, like a 16 oz bottle, she avoids drinking it, stating it is psychological. Pt states she states she walks a little farther each time, stating she goes to one house one day and then go to a further house the next time. Pt states she is cutting out/back on her french bread, croissants, and bagels.  Dietitian discussed other bread options with whole grains that are higher in fiber.  Pt states she gets fresh or frozen vegetables, stating she doesn't like to get the canned vegetables. Pt states she really likes edamame (fresh).  Estimated daily fluid intake:  water 32 oz, plus other fluids. Supplements: vit D and calcium Current average weekly physical activity: yard work, ADLs (cleaning), pool exercises, gym (core and arms), walking trail with nephews.  24-Hr Dietary Recall First Meal: boiled egg, avocado toast, curry in eggs Snack: nuts, protein bars Second Meal: salads with fruit added, chicken, granola, nuts, apple cider vinaigrette (homemade) Snack: nuts, protein bars Third Meal: sirloin with two vegetables Snack: protein shake Beverages: fruit juice, decaffeinated tea and coffee, water  Estimated Energy Needs Calories: 1600   NUTRITION DIAGNOSIS  Overweight/obesity  ([place]-3.3) related to past poor dietary habits and physical inactivity as evidenced by patient w/ planned Sleeve Gastrectomy surgery following dietary guidelines for continued weight loss.   NUTRITION INTERVENTION  Nutrition counseling (C-1) and education (E-2) to facilitate bariatric surgery goals.  Why you need complex carbohydrates: Whole grains and other complex carbohydrates are required to have a healthy diet. Whole grains provide fiber which can help with blood glucose levels and help keep you satiated. Fruits and starchy vegetables provide essential vitamins and minerals required for immune function, eyesight support, brain support, bone density, wound healing and many other functions within the body. According to the current evidenced based 2020-2025 Dietary Guidelines for Americans, complex carbohydrates are part of a healthy eating pattern which is associated with a decreased risk for type 2 diabetes, cancers, and cardiovascular disease.   Pre-Op Goals Reviewed with the Patient Track food and beverage intake (pen and paper, MyFitness Pal, Baritastic app, etc.) Make healthy food choices while monitoring portion sizes Consume 3 meals per day or try to eat every 3-5 hours Avoid concentrated sugars and fried foods Keep sugar & fat in the single digits per serving on food labels Practice CHEWING your food (aim for applesauce consistency) Practice not drinking 15 minutes before, during, and 30 minutes after each meal and snack Avoid all carbonated beverages (ex: soda, sparkling beverages)  Limit caffeinated beverages (ex: coffee, tea, energy drinks) Avoid all sugar-sweetened beverages (ex: regular soda, sports drinks)  Avoid alcohol  Aim for 64-100 ounces of FLUID daily (with at least half of fluid intake being plain water)  Aim for at least 60-80 grams of PROTEIN daily Look for  a liquid protein source that contains ?15 g protein and ?5 g carbohydrate (ex: shakes, drinks, shots) Make a list  of non-food related activities Physical activity is an important part of a healthy lifestyle so keep it moving! The goal is to reach 150 minutes of exercise per week, including cardiovascular and weight baring activity.  Pre-Op Goals Progress & New Goals Continue: Track food and beverage intake (pen and paper, MyFitness Pal, Baritastic app, etc.) Continue:  Aim for 64-100 ounces of FLUID daily (with at least half of fluid intake being plain water)  Continue:  Aim for at least 60-80 grams of PROTEIN daily New: choose whole grain bread, keep in freezer to extend freshness and get a slice at a time out New: avoid drinking with meals  Handouts Provided Include    Learning Style & Readiness for Change Teaching method utilized: Visual & Auditory  Demonstrated degree of understanding via: Teach Back  Readiness Level: preparation Barriers to learning/adherence to lifestyle change: none identified  RD's Notes for next Visit  Patient progress toward chosen goals   MONITORING & EVALUATION Dietary intake, weekly physical activity, body weight, and pre-op goals in 1 month.   Next Steps  Patient is to return to [facility] in one month for next SWL visit.

## 2021-11-13 ENCOUNTER — Encounter: Payer: Medicare Other | Attending: Family Medicine | Admitting: Dietician

## 2021-11-13 ENCOUNTER — Encounter: Payer: Self-pay | Admitting: Dietician

## 2021-11-13 DIAGNOSIS — G4733 Obstructive sleep apnea (adult) (pediatric): Secondary | ICD-10-CM | POA: Insufficient documentation

## 2021-11-13 DIAGNOSIS — M129 Arthropathy, unspecified: Secondary | ICD-10-CM | POA: Insufficient documentation

## 2021-11-13 DIAGNOSIS — Z713 Dietary counseling and surveillance: Secondary | ICD-10-CM | POA: Diagnosis not present

## 2021-11-13 DIAGNOSIS — E669 Obesity, unspecified: Secondary | ICD-10-CM | POA: Insufficient documentation

## 2021-11-13 DIAGNOSIS — R5383 Other fatigue: Secondary | ICD-10-CM | POA: Insufficient documentation

## 2021-11-13 DIAGNOSIS — Z6838 Body mass index (BMI) 38.0-38.9, adult: Secondary | ICD-10-CM | POA: Diagnosis not present

## 2021-11-13 NOTE — Progress Notes (Signed)
Supervised Weight Loss Visit Bariatric Nutrition Education Appt Start Time: 10:35   End Time: 11:06  Planned Surgery: Sleeve   2 out of 6 SWL Appointments   NUTRITION ASSESSMENT  Anthropometrics  Start weight at [facility]: 236.6 lbs (date: 09/17/2021)  Height: 69 in Weight today: 238.2 lbs. BMI: 35.18 kg/m2     Clinical  Medical hx: Asthma, cancer, HTN, obesity, sleep apnea Medications: Sertraline, meloxicam, alprazolam, telmisartan, baclofen, tramadol, amitriptyline, sumatriptan Labs: glucose 109 Notable signs/symptoms: none noted Any previous deficiencies? No  Lifestyle & Dietary Hx  Pt states she arrived with a migraine from this morning. Pt states she started taking supplements: magnesium, black seed oil, ginger root, tart cherry. Pt states she is getting 60 ounces of water per day, stating it is easier to sip throughout the day. Pt states she has been listening to [facility]. Pt states she makes her own ginger tea, stating she does drink less soda now. Pt states she is active now Pt states she can't afford to eat, but between friends and family she has not missed a meal. 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 Pt states she is taking sea salt for cramps, one grain every time she drinks water.. Pt states she is starting to eat more quinqua. Pt states she is disabled and can only work 9 months out of the year, and limited hours during the week. Pt sates she has been using coconut milk with her bran cereal. Pt states she is trying to get away from preservatives.  Estimated daily fluid intake:  water 60 oz, plus other fluids. Supplements: vit D and calcium Current average weekly physical activity: yard work, ADLs (cleaning), gym (core and arms) once a week, walking trail with nephews, disk golf, small trampoline bouncing (3 sets of 3 minutes).  Work: folding clothes at the hotel  24-Hr Dietary Recall First Meal: ezekiel bread, boiled egg, avocado, cottage  cheese, chia seeds Snack: nuts, protein bars Second Meal: chicken from [employer]: nuts, berries Third Meal: sirloin with two vegetables Snack: raw vegetables (meal prep), watermelon, cucumbers. Beverages: home made tea (ginger, cloves, lemon), un-sweet tea, water, coke  Estimated Energy Needs Calories: 1600   NUTRITION DIAGNOSIS  Overweight/obesity ([place]-3.3) related to past poor dietary habits and physical inactivity as evidenced by patient w/ planned Sleeve Gastrectomy surgery following dietary guidelines for continued weight loss.   NUTRITION INTERVENTION  Nutrition counseling (C-1) and education (E-2) to facilitate bariatric surgery goals.  Why you need complex carbohydrates: Whole grains and other complex carbohydrates are required to have a healthy diet. Whole grains provide fiber which can help with blood glucose levels and help keep you satiated. Fruits and starchy vegetables provide essential vitamins and minerals required for immune function, eyesight support, brain support, bone density, wound healing and many other functions within the body. According to the current evidenced based 2020-2025 Dietary Guidelines for Americans, complex carbohydrates are part of a healthy eating pattern which is associated with a decreased risk for type 2 diabetes, cancers, and cardiovascular disease.   Pre-Op Goals Reviewed with the Patient Track food and beverage intake (pen and paper, MyFitness Pal, Baritastic app, etc.) Make healthy food choices while monitoring portion sizes Consume 3 meals per day or try to eat every 3-5 hours Avoid concentrated sugars and fried foods Keep sugar & fat in the single digits per serving on food labels Practice CHEWING your food (aim for applesauce consistency) Practice not drinking 15 minutes before, during, and  30 minutes after each meal and snack Avoid all carbonated beverages (ex: soda, sparkling beverages)  Limit caffeinated beverages (ex: coffee, tea,  energy drinks) Avoid all sugar-sweetened beverages (ex: regular soda, sports drinks)  Avoid alcohol  Aim for 64-100 ounces of FLUID daily (with at least half of fluid intake being plain water)  Aim for at least 60-80 grams of PROTEIN daily Look for a liquid protein source that contains ?15 g protein and ?5 g carbohydrate (ex: shakes, drinks, shots) Make a list of non-food related activities Physical activity is an important part of a healthy lifestyle so keep it moving! The goal is to reach 150 minutes of exercise per week, including cardiovascular and weight baring activity.  Pre-Op Goals Progress & New Goals Continue: Track food and beverage intake (pen and paper, MyFitness Pal, Baritastic app, etc.) Continue:  Aim for 64-100 ounces of FLUID daily (with at least half of fluid intake being plain water)  Continue:  Aim for at least 60-80 grams of PROTEIN daily Continue: choose whole grain bread, keep in freezer to extend freshness and get a slice at a time out Continue: avoid drinking with meals Re-engage/Continue: increase physical activity, add resistance New: cut out soda completely  Handouts Provided Include    Learning Style & Readiness for Change Teaching method utilized: Visual & Auditory  Demonstrated degree of understanding via: Teach Back  Readiness Level: preparation Barriers to learning/adherence to lifestyle change: none identified  RD's Notes for next Visit  Patient progress toward chosen goals   MONITORING & EVALUATION Dietary intake, weekly physical activity, body weight, and pre-op goals in 1 month.   Next Steps  Patient is to return to [facility] in one month for next SWL visit.

## 2021-12-10 NOTE — Progress Notes (Signed)
Cardiology Office Note:    Date:  12/11/2021   ID:  Tina Ponce, DOB 1965-06-28, MRN [id number]  PCP:  Shirline Frees, MD  [facility] Cardiologist:  Rudean Haskell MD [place] Electrophysiologist:  None   CC: Pre-Op eval Gastric sleeve (Dr. Kieth Brightly, Gastric Sleeve, Fax [phone])  History of Present Illness:    Tina Ponce is a 56 y.o. female with a hx of R Breast Cancer with 50 Gy radiation, Known LBBB, Morbid Obesity with HTN, OSA on CPAP, and HLD; distant smoker who presents for evaluation 02/24/20 for evaluation of L Hip Surgery ([facility]). 2022:  In interval had echo WNL, but evidence of a perfusion defect on NM Stress.  Started on ASA, had not received BB or and PRN nitroglycerin seen 04/13/20.  In interim of this visit, patient had intolerance to Nitrates and BB (felt week and sick with terrible headache).  Had cath without microvascular disease and had minimal CAD.  Patient notes that she is doing well.   Since last visit notes she recovered from her hip replacement. Had eye issues in June; was panicked about the photophobia and anxiety and CP.  Resolved without cardiac intervention  No chest pain or pressure .  No SOB/DOE and no PND/Orthopnea.  No weight gain or leg swelling.  No palpitations or syncope.  Since elevated LDL has made significant dietary changes- cut down sodas, simple sugars, saturated fats.  Has started at the gym and is in the pool. Is walking.  Fatigue has improved. No decrease in weight.   Past Medical History:  Diagnosis Date   Anxiety    Arthritis    Cancer ([place])    dx 12/12/2016 with right breast cancer   Depression    Family history of breast cancer    Genetic testing 12/26/2016   Breast/GYN panel (23 genes) @ [facility] - No pathogenic mutations detected   GERD (gastroesophageal reflux disease)    Headache    History of radiation therapy 03/13/17- 04/09/17   Right Breast, 2.67 Gy  X 15 fractions, and right  breast boost 2 Gy X 5 fractions.    Hyperlipidemia    Hypertension    Left bundle branch block    Primary localized osteoarthritis of right hip 01/07/2017   Sleep apnea    CPAP q night    Unspecified vitamin D deficiency     Past Surgical History:  Procedure Laterality Date   BREAST LUMPECTOMY WITH RADIOACTIVE SEED AND SENTINEL LYMPH NODE BIOPSY Right 02/03/2017   Procedure: BREAST LUMPECTOMY WITH RADIOACTIVE SEED AND SENTINEL LYMPH NODE BIOPSY WITH ADDITIONAL TISSUE MARGINS;  Surgeon: Jovita Kussmaul, MD;  Location: [place];  Service: General;  Laterality: Right;   CESAREAN SECTION  11/03/1990   /w epidural    DENTAL SURGERY     fillings and crown   INTRAVASCULAR PRESSURE WIRE/FFR STUDY N/A 05/25/2020   Procedure: INTRAVASCULAR PRESSURE WIRE/FFR STUDY;  Surgeon: Nelva Bush, MD;  Location: [place] [facility];  Service: Cardiovascular;  Laterality: N/A;   LEFT HEART CATH AND CORONARY ANGIOGRAPHY N/A 05/25/2020   Procedure: LEFT HEART CATH AND CORONARY ANGIOGRAPHY;  Surgeon: Nelva Bush, MD;  Location: [place] [facility];  Service: Cardiovascular;  Laterality: N/A;   TOTAL HIP ARTHROPLASTY Right 01/07/2017   Procedure: TOTAL HIP ARTHROPLASTY;  Surgeon: Marchia Bond, MD;  Location: [place];  Service: Orthopedics;  Laterality: Right;   TOTAL HIP ARTHROPLASTY Left 01/09/2021   Procedure: TOTAL HIP ARTHROPLASTY;  Surgeon: Marchia Bond, MD;  Location: [facility];  Service: Orthopedics;  Laterality: Left;    Current Medications: Current Meds  Medication Sig   acetaminophen (TYLENOL) 325 MG tablet Take 2 tablets (650 mg total) by mouth every 4 (four) hours as needed for moderate pain or mild pain. Eight times daily   albuterol (VENTOLIN HFA) 108 (90 Base) MCG/ACT inhaler as needed for shortness of breath.   ALPRAZolam (XANAX) 0.5 MG tablet Take 0.5 mg by mouth 2 (two) times daily.   amitriptyline (ELAVIL) 25 MG tablet Take 25 mg by mouth at bedtime.   baclofen (LIORESAL) 10 MG tablet  Take 1 tablet (10 mg total) by mouth 3 (three) times daily as needed for muscle spasms.   CALCIUM PO Take 1 tablet by mouth daily.   Cholecalciferol (VITAMIN D3) 50 MCG (2000 UT) TABS Take 2,000 Units by mouth in the morning and at bedtime.   famotidine (PEPCID) 20 MG tablet Take 20 mg by mouth as needed for heartburn or indigestion.   magnesium oxide (MAG-OX) 400 MG tablet Take 400 mg by mouth daily.   meloxicam (MOBIC) 15 MG tablet Take 15 mg by mouth daily.   neomycin-bacitracin-polymyxin (NEOSPORIN) ointment Apply 1 application topically daily as needed (Rash).   promethazine (PHENERGAN) 25 MG tablet Take 25 mg by mouth 4 (four) times daily as needed for migraine.   sertraline (ZOLOFT) 100 MG tablet Take 100 mg by mouth 2 (two) times daily.    SUMAtriptan (IMITREX) 100 MG tablet Take 100 mg by mouth every 2 (two) hours as needed for migraine. May repeat in 2 hours if headache persists or recurs.   telmisartan (MICARDIS) 20 MG tablet Take 20 mg by mouth daily.   traMADol (ULTRAM) 50 MG tablet as needed for pain.   [DISCONTINUED] aspirin EC 325 MG tablet Take 1 tablet (325 mg total) by mouth 2 (two) times daily.   [DISCONTINUED] oxyCODONE (ROXICODONE) 5 MG immediate release tablet Take 1 tablet (5 mg total) by mouth every 4 (four) hours as needed for severe pain.   [DISCONTINUED] sennosides-docusate sodium (SENOKOT-S) 8.6-50 MG tablet Take 2 tablets by mouth daily.     Allergies:   Avelox [moxifloxacin hcl in nacl], Codeine, Moxifloxacin, Flagyl [metronidazole], Sulfamethoxazole-trimethoprim, and Latex   Social History   Socioeconomic History   Marital status: Single    Spouse name: Not on file   Number of children: Not on file   Years of education: Not on file   Highest education level: Not on file  Occupational History   Not on file  Tobacco Use   Smoking status: Former    Types: Cigarettes    Quit date: 03/20/2005    Years since quitting: 16.7   Smokeless tobacco: Never  Vaping  Use   Vaping Use: Never used  Substance and Sexual Activity   Alcohol use: No   Drug use: No   Sexual activity: Not on file  Other Topics Concern   Not on file  Social History Narrative   Not on file   Social Determinants of Health   Financial Resource Strain: Not on file  Food Insecurity: Not on file  Transportation Needs: Not on file  Physical Activity: Not on file  Stress: Not on file  Social Connections: Not on file    Family History: The patient's family history includes Breast cancer in her maternal aunt; Breast cancer (age of onset: 48) in her sister; Breast cancer (age of onset: 83) in her sister; Cancer in her father and maternal uncle; Diabetes  in her mother; Hypertension in her mother; Ovarian cancer in her maternal aunt; Prostate cancer (age of onset: 61) in her maternal grandfather.  ROS:   Please see the history of present illness.     All other systems reviewed and are negative.  EKGs/Labs/Other Studies Reviewed:    The following studies were reviewed today:  EKG:   12/11/21: SR LBBB, Qtc 469 05/04/20: SR 92 LBBB rare PVCs 04/13/20: SR rate 77 LBBB QTc 482 02/24/2020: SR LBBB QTc 480  LEFT HEART CATH AND CORONARY ANGIOGRAPHY, LEFT HEART CATH AND CORONARY ANGIOGRAPHY 05/25/2020  Narrative Conclusions: 1. Minimal plaquing of the proximal RCA; otherwise no angiographically significant coronary artery disease. 2. No evidence of coronary microvascular dysfunction, with CFR of 3.5 (nl > 2.5) and IMR 9 (nl < 25). 3. Low normal left ventricular contraction with mildly elevated filling pressure.  Recommendations: 1. Primary prevention of coronary artery disease. 2. Consider evaluation for non-coronary causes of the patient's chest pain and shortness of breath. 3. No cardiac contraindication to proceeding with elective hip surgery.  Nelva Bush, MD [facility]   GATED SPECT MYO PERF [facility] STRESS 1D 02/28/2020  Narrative  Nuclear stress EF: 40%.   There is a small reversible defect of mild severity present in the mid anterior and apical anterior location. Findings suggestive of mild ischemia.  There is a small fixed defect of mild severity present in the mid anteroseptal and apical septal location. Defect severity appears similar on rest and stress imaging suggestive of infarct.  This is an intermediate risk study.  The left ventricular ejection fraction is moderately decreased (30-44%).  There was no ST segment deviation noted during stress.  No T wave inversion was noted during stress.  Gwyndolyn Kaufman, MD   ECHO COMPLETE WO IMAGING ENHANCING AGENT 02/24/2020  Narrative ECHOCARDIOGRAM REPORT    Patient Name:   JERA HEADINGS Date of Exam: 02/24/2020 Medical Rec #:  [id number]        Height:       68.0 in Accession #:    [id number]       Weight:       239.0 lb Date of Birth:  03-10-1966       BSA:          2.204 m Patient Age:    62 years         BP:           155/97 mmHg Patient Gender: F                HR:           84 bpm. Exam Location:  [address]  Procedure: 2D Echo, Cardiac Doppler and Color Doppler  Indications:    Z01.818 Preoperative clearance  History:        Patient has no prior history of Echocardiogram examinations. Arrythmias:LBBB; Risk Factors:Hypertension and HLD. Malignant neoplasm of upper-inner quadrant of right breast in female, estrogen receptor positive.  Sonographer:    Marygrace Drought RCS Referring Phys: [id number] [place] A Vint Pola  IMPRESSIONS   1. Left ventricular ejection fraction, by estimation, is 50 to 55%. The left ventricle has low normal function. The left ventricle has no regional wall motion abnormalities. Left ventricular diastolic parameters are consistent with Grade I diastolic dysfunction (impaired relaxation). 2. Right ventricular systolic function is normal. The right ventricular size is normal. There is normal pulmonary artery systolic pressure. 3. The mitral  valve is normal in structure. Trivial mitral valve regurgitation. 4.  The aortic valve is tricuspid. Aortic valve regurgitation is not visualized. 5. The inferior vena cava is normal in size with greater than 50% respiratory variability, suggesting right atrial pressure of 3 mmHg.  FINDINGS Left Ventricle: Left ventricular ejection fraction, by estimation, is 50 to 55%. The left ventricle has low normal function. The left ventricle has no regional wall motion abnormalities. Global longitudinal strain performed but not reported based on interpreter judgement due to suboptimal tracking. The left ventricular internal cavity size was normal in size. There is no left ventricular hypertrophy. Abnormal (paradoxical) septal motion, consistent with left bundle branch block. Left ventricular diastolic parameters are consistent with Grade I diastolic dysfunction (impaired relaxation).  Right Ventricle: The right ventricular size is normal. No increase in right ventricular wall thickness. Right ventricular systolic function is normal. There is normal pulmonary artery systolic pressure. The tricuspid regurgitant velocity is 2.37 m/s, and with an assumed right atrial pressure of 3 mmHg, the estimated right ventricular systolic pressure is 09.8 mmHg.  Left Atrium: Left atrial size was normal in size.  Right Atrium: Right atrial size was normal in size.  Pericardium: There is no evidence of pericardial effusion.  Mitral Valve: The mitral valve is normal in structure. Trivial mitral valve regurgitation.  Tricuspid Valve: The tricuspid valve is normal in structure. Tricuspid valve regurgitation is trivial.  Aortic Valve: The aortic valve is tricuspid. Aortic valve regurgitation is not visualized.  Pulmonic Valve: The pulmonic valve was not well visualized. Pulmonic valve regurgitation is not visualized.  Aorta: The aortic root and ascending aorta are structurally normal, with no evidence of  dilitation.  Venous: The inferior vena cava is normal in size with greater than 50% respiratory variability, suggesting right atrial pressure of 3 mmHg.  IAS/Shunts: The atrial septum is grossly normal.   LEFT VENTRICLE PLAX 2D LVIDd:         4.90 cm  Diastology LVIDs:         4.10 cm  LV e' medial:    6.64 cm/s LV PW:         1.10 cm  LV E/e' medial:  9.0 LV IVS:        0.80 cm  LV e' lateral:   12.10 cm/s LVOT diam:     2.10 cm  LV E/e' lateral: 5.0 LV SV:         56 LV SV Index:   25 LVOT Area:     3.46 cm   RIGHT VENTRICLE RV Basal diam:  3.30 cm RV S prime:     10.85 cm/s TAPSE (M-mode): 2.1 cm RVSP:           25.5 mmHg  LEFT ATRIUM             Index       RIGHT ATRIUM           Index LA diam:        3.50 cm 1.59 cm/m  RA Pressure: 3.00 mmHg LA Vol (A2C):   57.5 ml 26.08 ml/m RA Area:     13.40 cm LA Vol (A4C):   47.0 ml 21.32 ml/m RA Volume:   30.70 ml  13.93 ml/m LA Biplane Vol: 54.0 ml 24.50 ml/m AORTIC VALVE LVOT Vmax:   82.90 cm/s LVOT Vmean:  63.100 cm/s LVOT VTI:    0.162 m  AORTA Ao Root diam: 3.20 cm Ao Asc diam:  3.50 cm  MITRAL VALVE  TRICUSPID VALVE MV Area (PHT):             TR Peak grad:   22.5 mmHg MV Decel Time:             TR Vmax:        237.00 cm/s MV E velocity: 60.00 cm/s  Estimated RAP:  3.00 mmHg MV A velocity: 43.30 cm/s  RVSP:           25.5 mmHg MV E/A ratio:  1.39 SHUNTS Systemic VTI:  0.16 m Systemic Diam: 2.10 cm     Recent Labs: 08/20/2021: BUN 11; Creatinine, Ser 0.83; Hemoglobin 12.3; Platelets 242; Potassium 4.0; Sodium 139  Recent Lipid Panel No results found for: "CHOL", "TRIG", "HDL", "CHOLHDL", "VLDL", "LDLCALC", "LDLDIRECT"   Physical Exam:    VS:  BP 128/88   Pulse 73   Ht '5\' 9"'$  (1.753 m)   Wt 239 lb 6.4 oz (108.6 kg)   SpO2 96%   BMI 35.35 kg/m     Wt Readings from Last 3 Encounters:  12/11/21 239 lb 6.4 oz (108.6 kg)  11/13/21 238 lb 3.2 oz (108 kg)  10/15/21 242 lb 14.4 oz (110.2  kg)    Gen: no distress  Neck: No JVD Cardiac: No Rubs or Gallops, No murmur, RRR +2 radial pulses Respiratory: Clear to auscultation bilaterally, normal effort, normal  respiratory rate GI: Soft, nontender, non-distended  MS: No edema;  moves all extremities Integument: Skin feels warm Neuro:  At time of evaluation, alert and oriented to person/place/time/situation  Psych: Normal affect, patient feels well  ASSESSMENT:    1. Coronary artery disease involving native coronary artery of native heart without angina pectoris   2. LBBB (left bundle branch block)   3. Essential hypertension   4. Morbid obesity ([place])   5. Preoperative clearance   6. Malignant neoplasm of upper-inner quadrant of right breast in female, estrogen receptor positive ([place])     PLAN:    Minimal non obstructive CAD LBBB HLD Prior Malignancy with Cardiotoxic Radiation History of COVID-19  Morbid Obesity - asymptomatic  - can restart ASA 81 mg PO daily after surgery; would not re-start at this time - has not tolerated BB or Nitrates - LDL above goal in March; has may many nutrition and exercise changes since then; we will check fasting lipids today; if by Spring of 2024 still above 70 (post gastric sleeve) we will start rosuvastatin 5 mg  Preoperative Risk Assessment  - The Revised Cardiac Risk Index = 1 due to CAD - this which equates to .9%: low risk of perioperative myocardial infarction, pulmonary edema, ventricular fibrillation, cardiac arrest, or complete heart block.  - DASI score of 25 associated with 5.8 functional mets - No further cardiac testing is recommended prior to surgery.  - The patient may proceed to surgery at acceptable risk.     One year me or APP    Medication Adjustments/Labs and Tests Ordered: Current medicines are reviewed at length with the patient today.  Concerns regarding medicines are outlined above.  Orders Placed This Encounter  Procedures   Lipid panel   EKG  12-Lead   No orders of the defined types were placed in this encounter.   Patient Instructions  Medication Instructions:  Your physician recommends that you continue on your current medications as directed. Please refer to the Current Medication list given to you today.  *If you need a refill on your cardiac medications before your next appointment, please call  your pharmacy*   Lab Work: TODAY: FLP If you have labs (blood work) drawn today and your tests are completely normal, you will receive your results only by: [place] (if you have MyChart) OR A paper copy in the mail If you have any lab test that is abnormal or we need to change your treatment, we will call you to review the results.   Testing/Procedures: NONE   Follow-Up: At [facility], you and your health needs are our priority.  As part of our continuing mission to provide you with exceptional heart care, we have created designated Provider Care Teams.  These Care Teams include your primary Cardiologist (physician) and Advanced Practice Providers (APPs -  Physician Assistants and Nurse Practitioners) who all work together to provide you with the care you need, when you need it.   Your next appointment:   12 month(s)  The format for your next appointment:   In Person  Provider:   Werner Lean, MD      Important Information About Sugar         Signed, Werner Lean, MD  12/11/2021 9:40 AM    [place]

## 2021-12-11 ENCOUNTER — Other Ambulatory Visit (HOSPITAL_COMMUNITY): Payer: Self-pay | Admitting: General Surgery

## 2021-12-11 ENCOUNTER — Ambulatory Visit: Payer: Medicare Other | Attending: Internal Medicine | Admitting: Internal Medicine

## 2021-12-11 ENCOUNTER — Encounter: Payer: Self-pay | Admitting: Internal Medicine

## 2021-12-11 VITALS — BP 128/88 | HR 73 | Ht 69.0 in | Wt 239.4 lb

## 2021-12-11 DIAGNOSIS — I10 Essential (primary) hypertension: Secondary | ICD-10-CM

## 2021-12-11 DIAGNOSIS — Z170 Estrogen receptor positive status [ER+]: Secondary | ICD-10-CM

## 2021-12-11 DIAGNOSIS — I447 Left bundle-branch block, unspecified: Secondary | ICD-10-CM

## 2021-12-11 DIAGNOSIS — I2510 Atherosclerotic heart disease of native coronary artery without angina pectoris: Secondary | ICD-10-CM | POA: Diagnosis not present

## 2021-12-11 DIAGNOSIS — G4733 Obstructive sleep apnea (adult) (pediatric): Secondary | ICD-10-CM

## 2021-12-11 DIAGNOSIS — C50211 Malignant neoplasm of upper-inner quadrant of right female breast: Secondary | ICD-10-CM | POA: Diagnosis not present

## 2021-12-11 DIAGNOSIS — Z01818 Encounter for other preprocedural examination: Secondary | ICD-10-CM | POA: Diagnosis not present

## 2021-12-11 NOTE — Patient Instructions (Signed)
Medication Instructions:  Your physician recommends that you continue on your current medications as directed. Please refer to the Current Medication list given to you today.  *If you need a refill on your cardiac medications before your next appointment, please call your pharmacy*   Lab Work: TODAY: FLP If you have labs (blood work) drawn today and your tests are completely normal, you will receive your results only by: [place] (if you have MyChart) OR A paper copy in the mail If you have any lab test that is abnormal or we need to change your treatment, we will call you to review the results.   Testing/Procedures: NONE   Follow-Up: At [facility], you and your health needs are our priority.  As part of our continuing mission to provide you with exceptional heart care, we have created designated Provider Care Teams.  These Care Teams include your primary Cardiologist (physician) and Advanced Practice Providers (APPs -  Physician Assistants and Nurse Practitioners) who all work together to provide you with the care you need, when you need it.   Your next appointment:   12 month(s)  The format for your next appointment:   In Person  Provider:   Werner Lean, MD      Important Information About Sugar

## 2021-12-12 LAB — LIPID PANEL
Chol/HDL Ratio: 5.2 ratio — ABNORMAL HIGH (ref 0.0–4.4)
Cholesterol, Total: 217 mg/dL — ABNORMAL HIGH (ref 100–199)
HDL: 42 mg/dL (ref >39)
LDL Chol Calc (NIH): 127 mg/dL — ABNORMAL HIGH (ref 0–99)
Triglycerides: 271 mg/dL — ABNORMAL HIGH (ref 0–149)
VLDL Cholesterol Cal: 48 mg/dL — ABNORMAL HIGH (ref 5–40)

## 2021-12-13 ENCOUNTER — Encounter: Payer: Self-pay | Admitting: Dietician

## 2021-12-13 ENCOUNTER — Encounter: Payer: Medicare Other | Attending: Family Medicine | Admitting: Dietician

## 2021-12-13 DIAGNOSIS — Z6835 Body mass index (BMI) 35.0-35.9, adult: Secondary | ICD-10-CM | POA: Insufficient documentation

## 2021-12-13 DIAGNOSIS — E669 Obesity, unspecified: Secondary | ICD-10-CM | POA: Insufficient documentation

## 2021-12-13 DIAGNOSIS — G4733 Obstructive sleep apnea (adult) (pediatric): Secondary | ICD-10-CM | POA: Insufficient documentation

## 2021-12-13 DIAGNOSIS — M129 Arthropathy, unspecified: Secondary | ICD-10-CM | POA: Insufficient documentation

## 2021-12-13 DIAGNOSIS — R5383 Other fatigue: Secondary | ICD-10-CM | POA: Diagnosis not present

## 2021-12-13 DIAGNOSIS — Z713 Dietary counseling and surveillance: Secondary | ICD-10-CM | POA: Insufficient documentation

## 2021-12-13 NOTE — Progress Notes (Signed)
Supervised Weight Loss Visit Bariatric Nutrition Education Appt Start Time: 9:10   End Time:   Planned Surgery: Sleeve   3 out of 6 SWL Appointments   NUTRITION ASSESSMENT  Anthropometrics  Start weight at [facility]: 236.6 lbs (date: 09/17/2021)  Height: 69 in Weight today: 237.d lbs. BMI: 35.07 kg/m2     Clinical  Medical hx: Asthma, cancer, HTN, obesity, sleep apnea Medications: Sertraline, meloxicam, alprazolam, telmisartan, baclofen, tramadol, amitriptyline, sumatriptan Labs: Chol 217; triglycerides 271; LDL 127; glucose 109 Notable signs/symptoms: none noted Any previous deficiencies? No  Lifestyle & Dietary Hx  Pt arrived stating her hip is bothering her, and she needs to ask her doctor about her pain. Pt states she went to her cardiologist, stating her cholesterol improved. Pt states the gym and pool is 35 minutes away, stating she has not been doing water focused physical activity. Pt states this would be less stress on her hips. Pt states she found a walking path with exercise equipment Pt states she started adding lemon to her water. Pt states she has been eating dry beans more, stating she is soaking her own beans, rather than buying canned beans. Pt states the dry beans freeze well to prep for future meals. Pt states she likes to eat breakfast like a king, lunch like a queen and dinner like a Electronics engineer. Pt states she does not have sugars sweetened foods in her house, stating she can't cheat.   Estimated daily fluid intake:  64+ oz. Supplements: vit D and calcium Protein: 60+ grams per day Current average weekly physical activity: yard work, ADLs (cleaning), gym (core and arms) once a week, walking trail with nephews, disk golf, small trampoline bouncing (3 sets of 3 minutes).  Work: folding clothes at the hotel  24-Hr Dietary Recall First Meal: oatmel, with chia seeds, walnuts, dates, apricots, cranberries, or strawberries or raspberries or overnight outs Snack: cottage  cheese, chia seeds, cucumbers Second Meal: salad with spinach, beets, edamame, squash/onions, egg Snack: nuts, berries Third Meal: protein (chicken, pork, fish) with sweet potatoes, spinach, brussels sprouts, greens, mushrooms or dry beans Snack: nuts, raw vegetables (meal prep), watermelon, cucumbers, frozen grapes Beverages: home made tea (ginger, cloves, lemon), un-sweet tea, water, coffee, nut-milk  Estimated Energy Needs Calories: 1600  NUTRITION DIAGNOSIS  Overweight/obesity ([place]-3.3) related to past poor dietary habits and physical inactivity as evidenced by patient w/ planned Sleeve Gastrectomy surgery following dietary guidelines for continued weight loss.   NUTRITION INTERVENTION  Nutrition counseling (C-1) and education (E-2) to facilitate bariatric surgery goals.  Why you need complex carbohydrates: Whole grains and other complex carbohydrates are required to have a healthy diet. Whole grains provide fiber which can help with blood glucose levels and help keep you satiated. Fruits and starchy vegetables provide essential vitamins and minerals required for immune function, eyesight support, brain support, bone density, wound healing and many other functions within the body. According to the current evidenced based 2020-2025 Dietary Guidelines for Americans, complex carbohydrates are part of a healthy eating pattern which is associated with a decreased risk for type 2 diabetes, cancers, and cardiovascular disease.   Pre-Op Goals Reviewed with the Patient Track food and beverage intake (pen and paper, MyFitness Pal, Baritastic app, etc.) Make healthy food choices while monitoring portion sizes Consume 3 meals per day or try to eat every 3-5 hours Avoid concentrated sugars and fried foods Keep sugar & fat in the single digits per serving on food labels Practice CHEWING your food (aim for applesauce consistency)  Practice not drinking 15 minutes before, during, and 30 minutes after  each meal and snack Avoid all carbonated beverages (ex: soda, sparkling beverages)  Limit caffeinated beverages (ex: coffee, tea, energy drinks) Avoid all sugar-sweetened beverages (ex: regular soda, sports drinks)  Avoid alcohol  Aim for 64-100 ounces of FLUID daily (with at least half of fluid intake being plain water)  Aim for at least 60-80 grams of PROTEIN daily Look for a liquid protein source that contains ?15 g protein and ?5 g carbohydrate (ex: shakes, drinks, shots) Make a list of non-food related activities Physical activity is an important part of a healthy lifestyle so keep it moving! The goal is to reach 150 minutes of exercise per week, including cardiovascular and weight baring activity.  Pre-Op Goals Progress & New Goals Continue: Track food and beverage intake (pen and paper, MyFitness Pal, Baritastic app, etc.) Continue:  Aim for 64-100 ounces of FLUID daily (with at least half of fluid intake being plain water)  Continue:  Aim for at least 60-80 grams of PROTEIN daily Continue: choose whole grain bread, keep in freezer to extend freshness and get a slice at a time out Continue: avoid drinking with meals Re-engage/Continue: increase physical activity, add resistance and water activity Continue: cut out soda completely New: Create meals from Meal Ideas New: Even calories out throughout the day, 3 small meals, with snacks in between  Handouts Provided Include  -Meal Ideas Handout  Learning Style & Readiness for Change Teaching method utilized: Visual & Auditory  Demonstrated degree of understanding via: Teach Back  Readiness Level: preparation Barriers to learning/adherence to lifestyle change: none identified  RD's Notes for next Visit  Patient progress toward chosen goals   MONITORING & EVALUATION Dietary intake, weekly physical activity, body weight, and pre-op goals in 1 month.   Next Steps  Patient is to return to [facility] in one month for next SWL visit.

## 2021-12-17 ENCOUNTER — Ambulatory Visit (HOSPITAL_COMMUNITY)
Admission: RE | Admit: 2021-12-17 | Discharge: 2021-12-17 | Disposition: A | Discharge status: Home / Self Care | Payer: Medicare Other | Source: Ambulatory Visit | Attending: General Surgery | Admitting: General Surgery

## 2021-12-17 DIAGNOSIS — G4733 Obstructive sleep apnea (adult) (pediatric): Secondary | ICD-10-CM | POA: Diagnosis not present

## 2021-12-17 DIAGNOSIS — K224 Dyskinesia of esophagus: Secondary | ICD-10-CM | POA: Diagnosis not present

## 2021-12-17 DIAGNOSIS — Z01818 Encounter for other preprocedural examination: Secondary | ICD-10-CM | POA: Diagnosis not present

## 2021-12-19 ENCOUNTER — Telehealth: Payer: Self-pay

## 2021-12-19 NOTE — Patient Instructions (Signed)
Visit Information  Thank you for taking time to visit with me today. Please don't hesitate to contact me if I can be of assistance to you.   Following are the goals we discussed today:   Goals Addressed             This Visit's Progress    COMPLETED: Care Coordination Actvities-No follow up required       Care Coordination Interventions: Advised patient to schedule annual wellness visit.  Patient to call office           If you are experiencing a Mental Health or [place] or need someone to talk to, please call the Suicide and Crisis Lifeline: [phone]   Patient verbalizes understanding of instructions and care plan provided today and agrees to view in [place]. Active MyChart status and patient understanding of how to access instructions and care plan via MyChart confirmed with patient.     No further follow up required: patient decline  Jone Baseman, RN, MSN [facility] Care Management Coordinator Direct Line [phone]

## 2021-12-19 NOTE — Patient Outreach (Signed)
  Care Coordination   Initial Visit Note   12/19/2021 Name: TYNIA WIERS MRN: [id number] DOB: 04/16/1965  KAMERIA CANIZARES is a 56 y.o. year old female who sees Shirline Frees, MD for primary care. I spoke with  Diamantina [facility] by phone today.  What matters to the patients health and wellness today?  none    Goals Addressed             This Visit's Progress    COMPLETED: Care Coordination Actvities-No follow up required       Care Coordination Interventions: Advised patient to schedule annual wellness visit.  Patient to call office          SDOH assessments and interventions completed:  Yes     Care Coordination Interventions Activated:  Yes  Care Coordination Interventions:  Yes, provided   Follow up plan: No further intervention required.   Encounter Outcome:  Pt. Visit Completed   Jone Baseman, RN, MSN [facility] Care Management Coordinator Direct Line [phone]

## 2022-01-10 DIAGNOSIS — Z9189 Other specified personal risk factors, not elsewhere classified: Secondary | ICD-10-CM | POA: Diagnosis not present

## 2022-01-14 ENCOUNTER — Ambulatory Visit: Payer: Medicare Other | Admitting: Dietician

## 2022-01-17 ENCOUNTER — Encounter: Payer: Medicare Other | Attending: General Surgery | Admitting: Dietician

## 2022-01-17 ENCOUNTER — Encounter: Payer: Self-pay | Admitting: Dietician

## 2022-01-17 VITALS — Ht 69.0 in | Wt 240.5 lb

## 2022-01-17 DIAGNOSIS — Z6835 Body mass index (BMI) 35.0-35.9, adult: Secondary | ICD-10-CM | POA: Diagnosis not present

## 2022-01-17 DIAGNOSIS — R5383 Other fatigue: Secondary | ICD-10-CM | POA: Insufficient documentation

## 2022-01-17 DIAGNOSIS — M129 Arthropathy, unspecified: Secondary | ICD-10-CM | POA: Diagnosis not present

## 2022-01-17 DIAGNOSIS — E669 Obesity, unspecified: Secondary | ICD-10-CM | POA: Insufficient documentation

## 2022-01-17 DIAGNOSIS — Z713 Dietary counseling and surveillance: Secondary | ICD-10-CM | POA: Diagnosis not present

## 2022-01-17 DIAGNOSIS — G4733 Obstructive sleep apnea (adult) (pediatric): Secondary | ICD-10-CM | POA: Diagnosis not present

## 2022-01-17 NOTE — Progress Notes (Signed)
Supervised Weight Loss Visit Bariatric Nutrition Education Appt Start Time: 9:35  End Time:   Planned Surgery: Sleeve   4 out of 6 SWL Appointments   NUTRITION ASSESSMENT  Anthropometrics  Start weight at [facility]: 236.6 lbs (date: 09/17/2021)  Height: 69 in Weight today: 240.5 lbs. BMI: 35.52 kg/m2     Clinical  Medical hx: Asthma, cancer, HTN, obesity, sleep apnea Medications: Sertraline, meloxicam, alprazolam, telmisartan, baclofen, tramadol, amitriptyline, sumatriptan Labs: Chol 217; triglycerides 271; LDL 127; glucose 109 Notable signs/symptoms: none noted Any previous deficiencies? No  Lifestyle & Dietary Hx  Pt states she had tendonitis in her hips, and stretching and exercise helps. Pt states she has been strict about not drinking during her meals. Pt states she has been reading while she eats, stating that has worked for her to eat slower and states she gets satisfied better. Pt states she drank cucumber juice, stating she loves it. Pt states she is going to the store every day to every other day to only get what she needs, stating she finds she wastes less food that way. Pt states she has been reading the food labels more. Pt stats she does not buy sweets, stating she does not want them in the house.  Estimated daily fluid intake:  64+ oz. Supplements: vit D and calcium Protein: 60+ grams per day Current average weekly physical activity: ADLs and stretching to help her hip.  24-Hr Dietary Recall First Meal: oatmel, with chia seeds, walnuts, dates, apricots, cranberries, or strawberries or raspberries or overnight outs Snack: cottage cheese, chia seeds, cucumbers Second Meal: salad with spinach, beets, edamame, squash/onions, egg Snack: nuts, berries Third Meal: protein (chicken, pork, fish) with sweet potatoes, spinach, brussels sprouts, greens, mushrooms or dry beans Snack: nuts, raw vegetables (meal prep), watermelon, cucumbers, frozen grapes Beverages: home made  tea (ginger, cloves, lemon), un-sweet tea, water, coffee, nut-milk  Estimated Energy Needs Calories: 1600  NUTRITION DIAGNOSIS  Overweight/obesity ([place]-3.3) related to past poor dietary habits and physical inactivity as evidenced by patient w/ planned Sleeve Gastrectomy surgery following dietary guidelines for continued weight loss.   NUTRITION INTERVENTION  Nutrition counseling (C-1) and education (E-2) to facilitate bariatric surgery goals.  Reviewed... Why you need complex carbohydrates: Whole grains and other complex carbohydrates are required to have a healthy diet. Whole grains provide fiber which can help with blood glucose levels and help keep you satiated. Fruits and starchy vegetables provide essential vitamins and minerals required for immune function, eyesight support, brain support, bone density, wound healing and many other functions within the body. According to the current evidenced based 2020-2025 Dietary Guidelines for Americans, complex carbohydrates are part of a healthy eating pattern which is associated with a decreased risk for type 2 diabetes, cancers, and cardiovascular disease.   Pre-Op Goals Progress & New Goals Continue: Track food and beverage intake (pen and paper, MyFitness Pal, Baritastic app, etc.) Continue:  Aim for 64-100 ounces of FLUID daily (with at least half of fluid intake being plain water)  Continue:  Aim for at least 60-80 grams of PROTEIN daily Continue: choose whole grain bread, keep in freezer to extend freshness and get a slice at a time out Continue: avoid drinking with meals Continue: cut out soda completely Continue: Create meals from Meal Ideas Continue: Even calories out throughout the day, 3 small meals, with snacks in between Re-engage/Continue: increase physical activity, add resistance and water activity  Handouts Provided Include  -  Learning Style & Readiness for Change Teaching method  utilized: Optician, dispensing  Demonstrated  degree of understanding via: Teach Back  Readiness Level: preparation Barriers to learning/adherence to lifestyle change: none identified  RD's Notes for next Visit  Patient progress toward chosen goals   MONITORING & EVALUATION Dietary intake, weekly physical activity, body weight, and pre-op goals in 1 month.   Next Steps  Patient is to return to [facility] in one month for next SWL visit.

## 2022-02-14 ENCOUNTER — Encounter: Payer: Self-pay | Admitting: Dietician

## 2022-02-14 ENCOUNTER — Encounter: Payer: Medicare Other | Attending: Family Medicine | Admitting: Dietician

## 2022-02-14 VITALS — Ht 69.0 in | Wt 241.9 lb

## 2022-02-14 DIAGNOSIS — G4733 Obstructive sleep apnea (adult) (pediatric): Secondary | ICD-10-CM | POA: Diagnosis not present

## 2022-02-14 DIAGNOSIS — Z6835 Body mass index (BMI) 35.0-35.9, adult: Secondary | ICD-10-CM | POA: Insufficient documentation

## 2022-02-14 DIAGNOSIS — Z713 Dietary counseling and surveillance: Secondary | ICD-10-CM | POA: Insufficient documentation

## 2022-02-14 DIAGNOSIS — E669 Obesity, unspecified: Secondary | ICD-10-CM | POA: Insufficient documentation

## 2022-02-14 DIAGNOSIS — R5383 Other fatigue: Secondary | ICD-10-CM | POA: Diagnosis not present

## 2022-02-14 DIAGNOSIS — M129 Arthropathy, unspecified: Secondary | ICD-10-CM | POA: Insufficient documentation

## 2022-02-14 NOTE — Progress Notes (Signed)
Supervised Weight Loss Visit Bariatric Nutrition Education Appt Start Time: 9:15  End Time: 9:46  Planned Surgery: Sleeve   5 out of 6 SWL Appointments   NUTRITION ASSESSMENT  Anthropometrics  Start weight at [facility]: 236.6 lbs (date: 09/17/2021)  Height: 69 in Weight today: 241.9 lbs. BMI: 35.72 kg/m2     Clinical  Medical hx: Asthma, cancer, HTN, obesity, sleep apnea Medications: Sertraline, meloxicam, alprazolam, telmisartan, baclofen, tramadol, amitriptyline, sumatriptan Labs: Chol 217; triglycerides 271; LDL 127; glucose 109 Notable signs/symptoms: none noted Any previous deficiencies? No  Lifestyle & Dietary Hx  Pt states she can not go far walking due to her hips, stating she goes about 1/4 mile a few times a day, almost every day. Pt states she rarely eats anything fried, and uses mostly olive oil. Pt states she might be eating too much read meat, stating she doesn't do chicken, and doesn't prepare fish well. Pt states she is still practicing not drinking with her meals. Pt states she pretty much knows how much protein foods she needs to meet her needs, reaching 60 grams of protein.   Estimated daily fluid intake:  64+ oz Supplements: vit D and calcium Protein: 60+ grams per day Current average weekly physical activity: ADLs and stretching to help her hip. Walking, stating she walks a little several times a day.  24-Hr Dietary Recall First Meal: oatmel, with chia seeds, walnuts, dates, apricots, cranberries, or strawberries or raspberries or overnight oats Snack: cucumbers or yogurt Second Meal: salad with spinach, beets, edamame, squash/onions, egg Snack: nuts, berries Third Meal: protein (chicken, fish) with sweet potatoes, spinach, brussels sprouts, greens, mushrooms or dry beans Snack: nuts, raw vegetables (meal prep), cucumbers, frozen grapes, no sugar sherbet or italian ice Beverages: home made tea (ginger, cloves, lemon), un-sweet tea, water, coffee, nut-milk,  water, un-sweet lemonade (homemade)  Estimated Energy Needs Calories: 1600  NUTRITION DIAGNOSIS  Overweight/obesity ([place]-3.3) related to past poor dietary habits and physical inactivity as evidenced by patient w/ planned Sleeve Gastrectomy surgery following dietary guidelines for continued weight loss.   NUTRITION INTERVENTION  Nutrition counseling (C-1) and education (E-2) to facilitate bariatric surgery goals.  Reviewed... Why you need complex carbohydrates: Whole grains and other complex carbohydrates are required to have a healthy diet. Whole grains provide fiber which can help with blood glucose levels and help keep you satiated. Fruits and starchy vegetables provide essential vitamins and minerals required for immune function, eyesight support, brain support, bone density, wound healing and many other functions within the body. According to the current evidenced based 2020-2025 Dietary Guidelines for Americans, complex carbohydrates are part of a healthy eating pattern which is associated with a decreased risk for type 2 diabetes, cancers, and cardiovascular disease.   Discussed the importance of unsaturated fats, which are liquid at room temperature, are considered beneficial fats because they can improve blood cholesterol levels, ease inflammation, stabilize heart rhythms. Unsaturated fats are predominantly found in foods from plants, such as vegetable oils, nuts, and seeds.  Discussed unhealthy saturated and trans fats. These fats are most often solid at room temperature. Foods like butter, palm and coconut oils, cheese, and red meat have high amounts of saturated fat.Too much saturated fat in your diet can lead to heart disease and other health problems.  Pre-Op Goals Progress & New Goals Continue: Track food and beverage intake (pen and paper, MyFitness Pal, Baritastic app, etc.) Continue:  Aim for 64-100 ounces of FLUID daily (with at least half of fluid intake being plain  water)   Continue:  Aim for at least 60-80 grams of PROTEIN daily Continue: choose whole grain bread, keep in freezer to extend freshness and get a slice at a time out Continue: avoid drinking with meals Continue: cut out soda completely Continue: Create meals from Meal Ideas Re-engage/Continue: Even calories out throughout the day, 3 small meals, with snacks in between Re-engage/Continue: increase physical activity, add resistance and water activity New: decrease beef intake, increase, fish and plant based proteins to help decrease cholesterol. New: increase fiber, with whole grain cereals, and whole fruits and vegetables  Handouts Provided Include  -  Learning Style & Readiness for Change Teaching method utilized: Visual & Auditory  Demonstrated degree of understanding via: Teach Back  Readiness Level: preparation Barriers to learning/adherence to lifestyle change: none identified  RD's Notes for next Visit  Patient progress toward chosen goals   MONITORING & EVALUATION Dietary intake, weekly physical activity, body weight, and pre-op goals in 1 month.   Next Steps  Patient is to return to [facility] in one month for next SWL visit.

## 2022-02-27 ENCOUNTER — Other Ambulatory Visit: Payer: Self-pay

## 2022-02-27 ENCOUNTER — Encounter (HOSPITAL_BASED_OUTPATIENT_CLINIC_OR_DEPARTMENT_OTHER): Payer: Self-pay

## 2022-02-27 ENCOUNTER — Emergency Department (HOSPITAL_BASED_OUTPATIENT_CLINIC_OR_DEPARTMENT_OTHER)
Admission: EM | Admit: 2022-02-27 | Discharge: 2022-02-27 | Discharge status: Against Medical Advice | Payer: Medicare Other | Attending: Emergency Medicine | Admitting: Emergency Medicine

## 2022-02-27 DIAGNOSIS — S0993XA Unspecified injury of face, initial encounter: Secondary | ICD-10-CM | POA: Diagnosis present

## 2022-02-27 DIAGNOSIS — S01511A Laceration without foreign body of lip, initial encounter: Secondary | ICD-10-CM | POA: Diagnosis not present

## 2022-02-27 DIAGNOSIS — Z5321 Procedure and treatment not carried out due to patient leaving prior to being seen by health care provider: Secondary | ICD-10-CM | POA: Insufficient documentation
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
## 2022-03-14 ENCOUNTER — Ambulatory Visit: Payer: Medicare Other | Admitting: Dietician

## 2022-03-18 ENCOUNTER — Encounter: Payer: Medicare Other | Attending: General Surgery | Admitting: Dietician

## 2022-03-18 ENCOUNTER — Encounter: Payer: Self-pay | Admitting: Dietician

## 2022-03-18 VITALS — Ht 69.0 in | Wt 241.0 lb

## 2022-03-18 DIAGNOSIS — G4733 Obstructive sleep apnea (adult) (pediatric): Secondary | ICD-10-CM | POA: Insufficient documentation

## 2022-03-18 DIAGNOSIS — Z6835 Body mass index (BMI) 35.0-35.9, adult: Secondary | ICD-10-CM | POA: Insufficient documentation

## 2022-03-18 DIAGNOSIS — R5383 Other fatigue: Secondary | ICD-10-CM | POA: Insufficient documentation

## 2022-03-18 DIAGNOSIS — Z713 Dietary counseling and surveillance: Secondary | ICD-10-CM | POA: Insufficient documentation

## 2022-03-18 DIAGNOSIS — E669 Obesity, unspecified: Secondary | ICD-10-CM | POA: Insufficient documentation

## 2022-03-18 DIAGNOSIS — M129 Arthropathy, unspecified: Secondary | ICD-10-CM | POA: Insufficient documentation
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
## 2022-03-26 DIAGNOSIS — E7800 Pure hypercholesterolemia, unspecified: Secondary | ICD-10-CM | POA: Diagnosis not present

## 2022-03-26 DIAGNOSIS — G4730 Sleep apnea, unspecified: Secondary | ICD-10-CM | POA: Diagnosis not present

## 2022-03-26 DIAGNOSIS — S060X0D Concussion without loss of consciousness, subsequent encounter: Secondary | ICD-10-CM | POA: Diagnosis not present

## 2022-03-26 DIAGNOSIS — I10 Essential (primary) hypertension: Secondary | ICD-10-CM | POA: Diagnosis not present

## 2022-03-26 DIAGNOSIS — G43009 Migraine without aura, not intractable, without status migrainosus: Secondary | ICD-10-CM | POA: Diagnosis not present

## 2022-04-11 DIAGNOSIS — R5383 Other fatigue: Secondary | ICD-10-CM | POA: Diagnosis not present

## 2022-04-11 DIAGNOSIS — E559 Vitamin D deficiency, unspecified: Secondary | ICD-10-CM | POA: Diagnosis not present

## 2022-04-11 DIAGNOSIS — M129 Arthropathy, unspecified: Secondary | ICD-10-CM | POA: Diagnosis not present

## 2022-04-11 DIAGNOSIS — I447 Left bundle-branch block, unspecified: Secondary | ICD-10-CM | POA: Diagnosis not present

## 2022-04-11 DIAGNOSIS — R7309 Other abnormal glucose: Secondary | ICD-10-CM | POA: Diagnosis not present

## 2022-04-11 DIAGNOSIS — M1909 Primary osteoarthritis, other specified site: Secondary | ICD-10-CM | POA: Diagnosis not present

## 2022-04-25 DIAGNOSIS — Z1231 Encounter for screening mammogram for malignant neoplasm of breast: Secondary | ICD-10-CM | POA: Diagnosis not present

## 2022-07-01 IMAGING — DX DG CHEST 2V
2 series · 2 of 2 positions shown · non-contrast
Comparison: Radiographs 05/14/2021 and 02/10/2017.

CLINICAL DATA: Chest pain with dizziness. History of breast cancer,
hypertension and asthma.

EXAM:
CHEST - 2 VIEW

[chest lat]
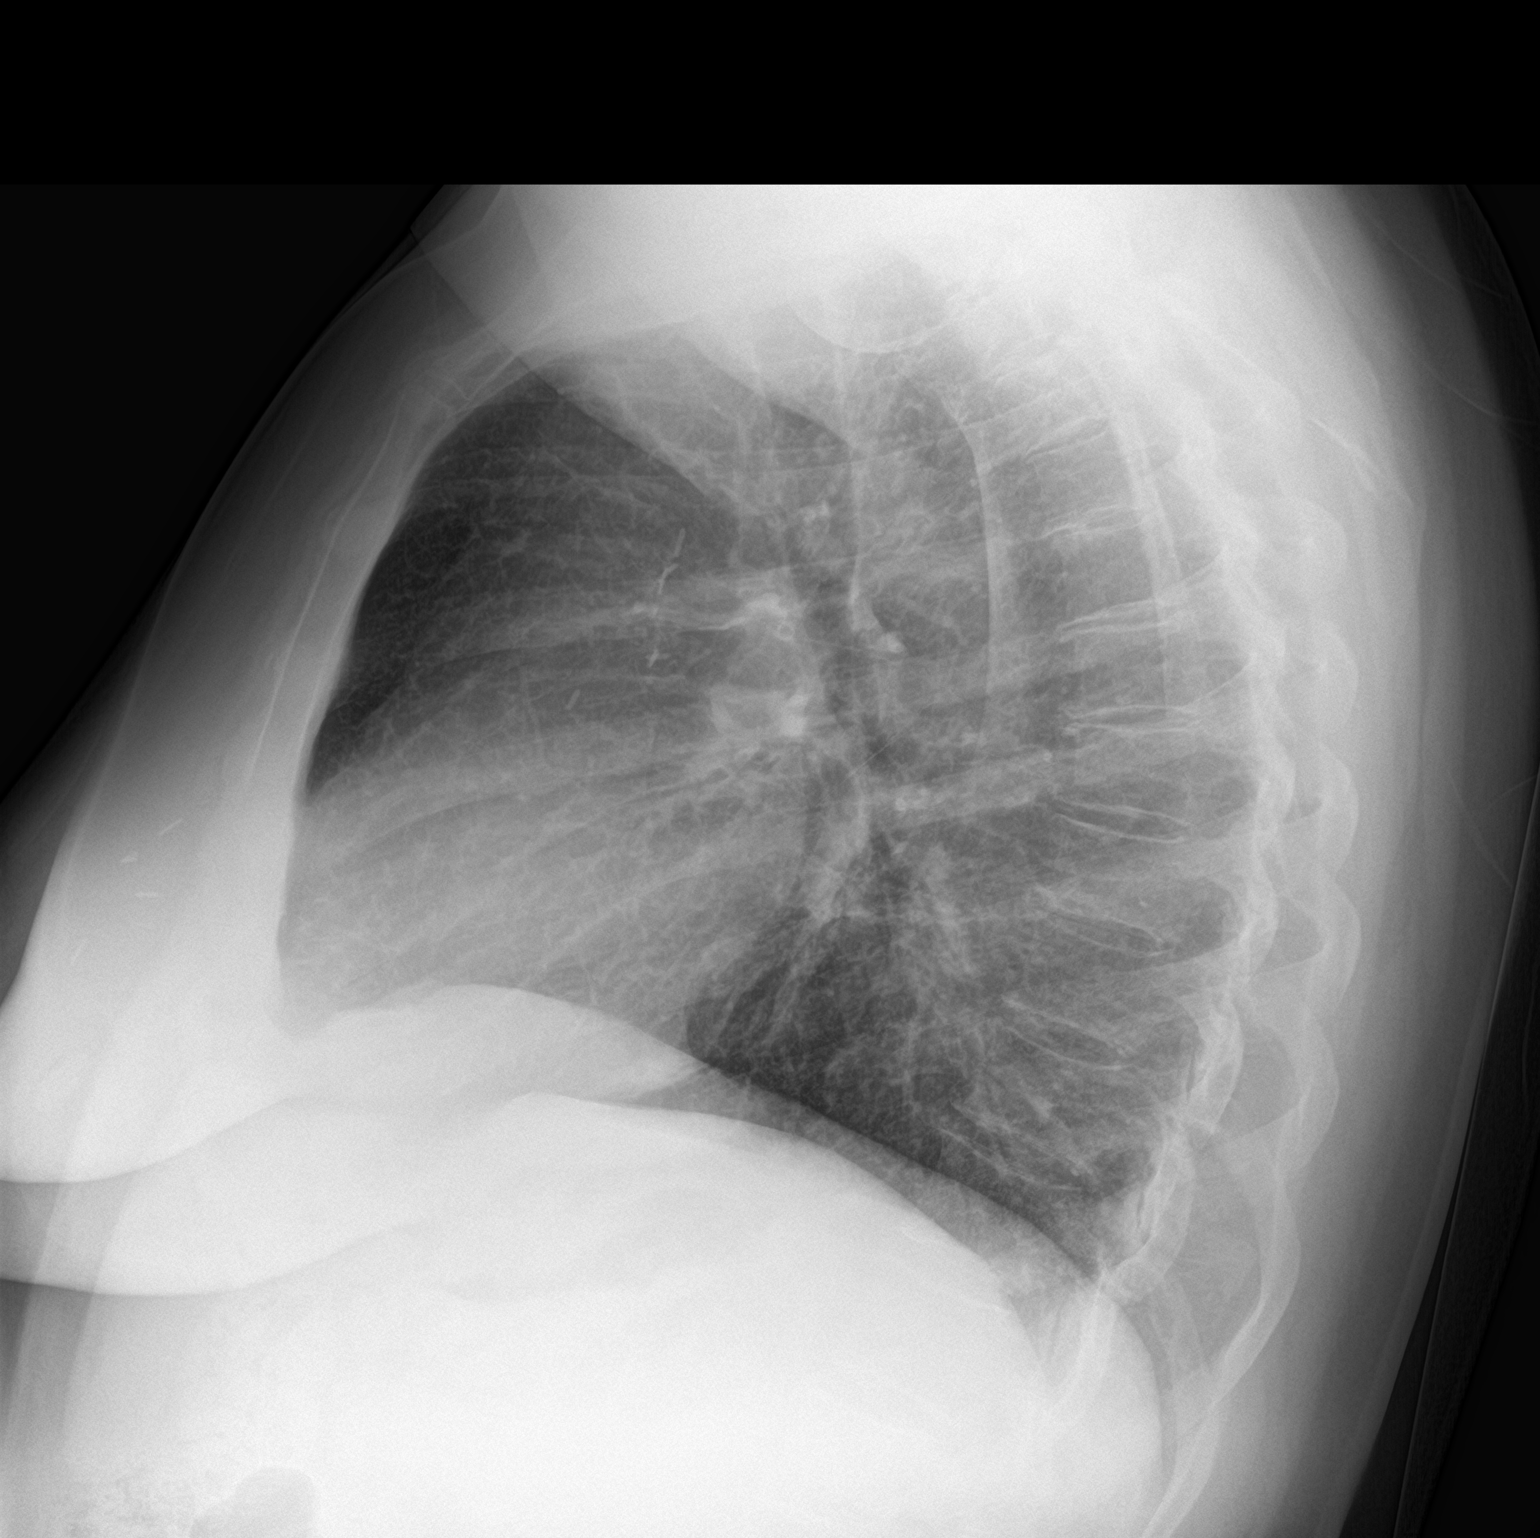

[chest ap]
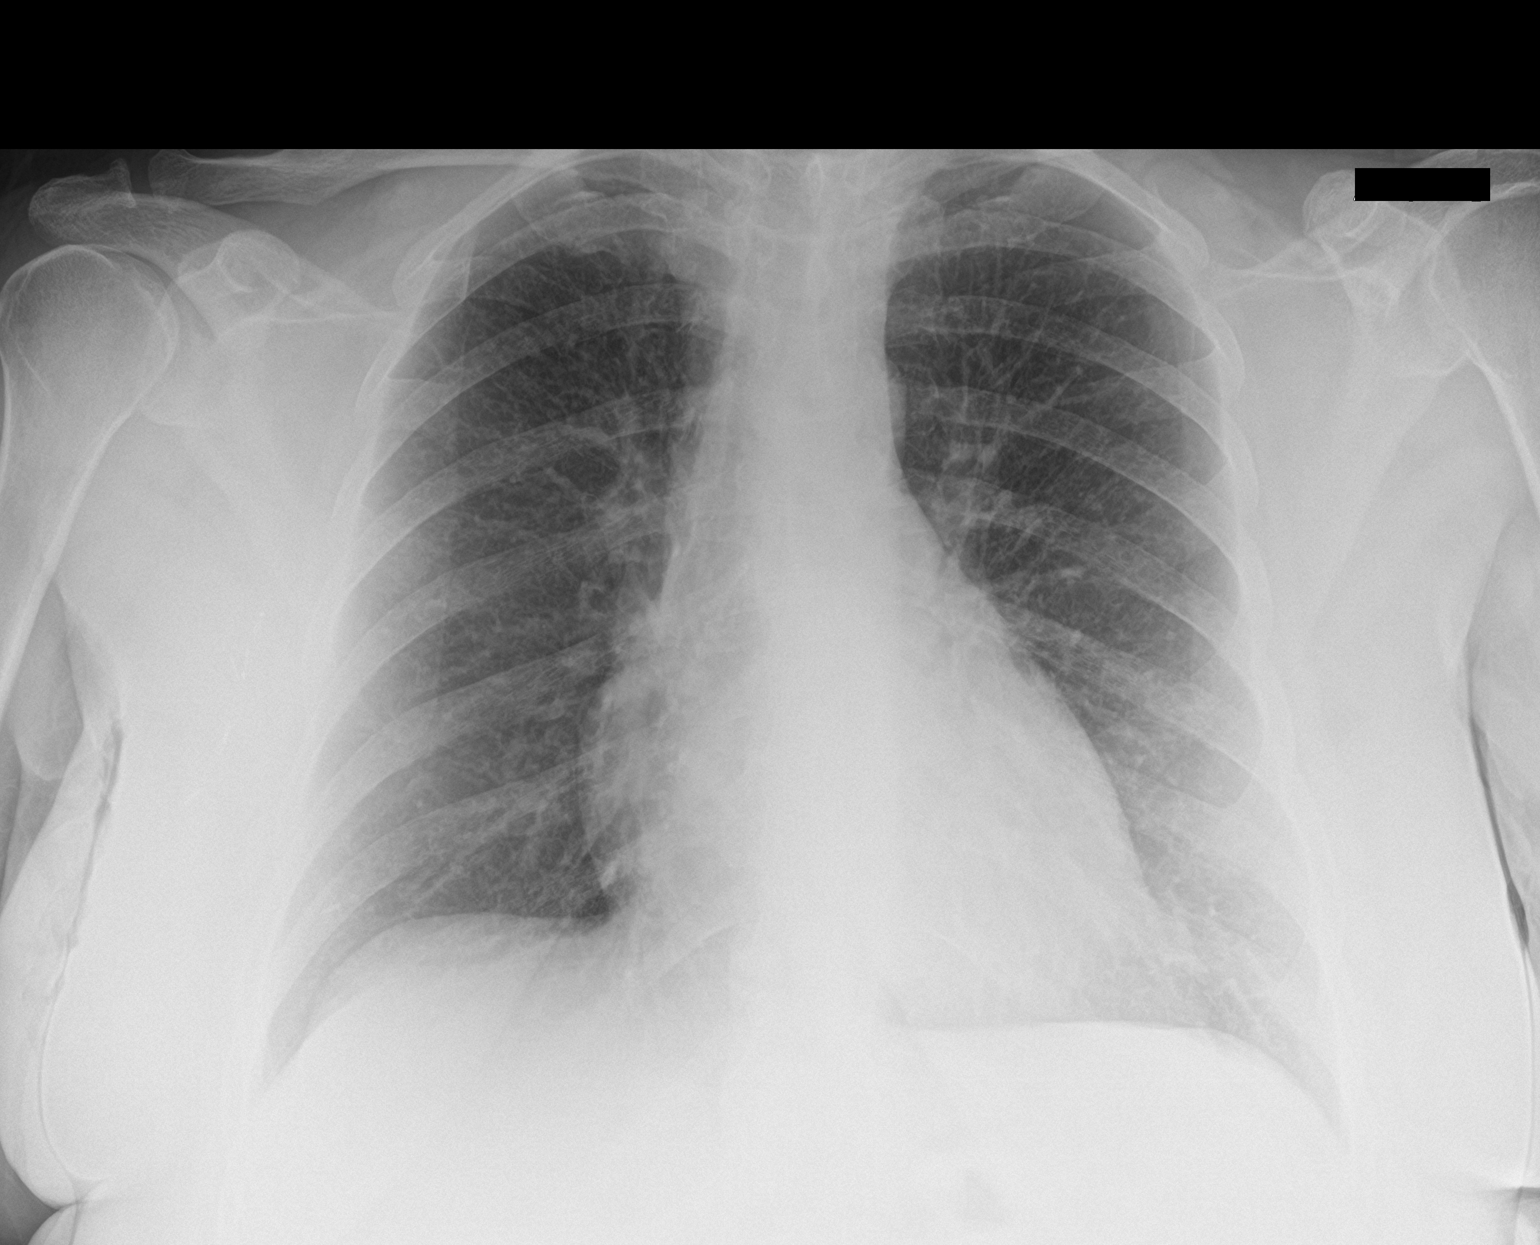

[2 of 2 positions shown; findings below may reference images not displayed]

FINDINGS: The heart size and mediastinal contours are stable. There is mild
aortic atherosclerosis. The lungs are clear. No pleural effusion or
pneumothorax. Postsurgical changes are present within the right
breast and right axilla. There are mild degenerative changes in the
spine. No acute osseous findings are evident.
IMPRESSION: Stable postoperative chest. No evidence of acute cardiopulmonary
process.

## 2022-07-04 DIAGNOSIS — N12 Tubulo-interstitial nephritis, not specified as acute or chronic: Secondary | ICD-10-CM | POA: Diagnosis not present

## 2022-07-04 DIAGNOSIS — N390 Urinary tract infection, site not specified: Secondary | ICD-10-CM | POA: Diagnosis not present

## 2022-09-09 DIAGNOSIS — I10 Essential (primary) hypertension: Secondary | ICD-10-CM | POA: Diagnosis not present

## 2022-09-09 DIAGNOSIS — F5101 Primary insomnia: Secondary | ICD-10-CM | POA: Diagnosis not present

## 2023-03-10 DIAGNOSIS — H40033 Anatomical narrow angle, bilateral: Secondary | ICD-10-CM | POA: Diagnosis not present

## 2023-03-10 DIAGNOSIS — H1013 Acute atopic conjunctivitis, bilateral: Secondary | ICD-10-CM | POA: Diagnosis not present

## 2023-03-10 DIAGNOSIS — Z9189 Other specified personal risk factors, not elsewhere classified: Secondary | ICD-10-CM | POA: Diagnosis not present

## 2023-03-25 DIAGNOSIS — G43009 Migraine without aura, not intractable, without status migrainosus: Secondary | ICD-10-CM | POA: Diagnosis not present

## 2023-03-25 DIAGNOSIS — G4730 Sleep apnea, unspecified: Secondary | ICD-10-CM | POA: Diagnosis not present

## 2023-03-25 DIAGNOSIS — R7303 Prediabetes: Secondary | ICD-10-CM | POA: Diagnosis not present

## 2023-03-25 DIAGNOSIS — E7800 Pure hypercholesterolemia, unspecified: Secondary | ICD-10-CM | POA: Diagnosis not present

## 2023-03-25 DIAGNOSIS — I5189 Other ill-defined heart diseases: Secondary | ICD-10-CM | POA: Diagnosis not present

## 2023-03-25 DIAGNOSIS — Z0000 Encounter for general adult medical examination without abnormal findings: Secondary | ICD-10-CM | POA: Diagnosis not present

## 2023-03-25 DIAGNOSIS — I10 Essential (primary) hypertension: Secondary | ICD-10-CM | POA: Diagnosis not present

## 2023-04-28 DIAGNOSIS — Z1231 Encounter for screening mammogram for malignant neoplasm of breast: Secondary | ICD-10-CM | POA: Diagnosis not present

## 2023-06-30 DIAGNOSIS — E039 Hypothyroidism, unspecified: Secondary | ICD-10-CM | POA: Diagnosis not present

## 2023-09-23 DIAGNOSIS — E7800 Pure hypercholesterolemia, unspecified: Secondary | ICD-10-CM | POA: Diagnosis not present

## 2023-09-23 DIAGNOSIS — K130 Diseases of lips: Secondary | ICD-10-CM | POA: Diagnosis not present

## 2023-09-23 DIAGNOSIS — G4730 Sleep apnea, unspecified: Secondary | ICD-10-CM | POA: Diagnosis not present

## 2023-09-23 DIAGNOSIS — D649 Anemia, unspecified: Secondary | ICD-10-CM | POA: Diagnosis not present

## 2023-09-23 DIAGNOSIS — E559 Vitamin D deficiency, unspecified: Secondary | ICD-10-CM | POA: Diagnosis not present

## 2023-09-23 DIAGNOSIS — G43009 Migraine without aura, not intractable, without status migrainosus: Secondary | ICD-10-CM | POA: Diagnosis not present

## 2023-09-23 DIAGNOSIS — I10 Essential (primary) hypertension: Secondary | ICD-10-CM | POA: Diagnosis not present

## 2023-09-23 DIAGNOSIS — I5189 Other ill-defined heart diseases: Secondary | ICD-10-CM | POA: Diagnosis not present

## 2023-09-23 DIAGNOSIS — E039 Hypothyroidism, unspecified: Secondary | ICD-10-CM | POA: Diagnosis not present

## 2023-09-23 DIAGNOSIS — R7303 Prediabetes: Secondary | ICD-10-CM | POA: Diagnosis not present

## 2023-09-29 DIAGNOSIS — M1612 Unilateral primary osteoarthritis, left hip: Secondary | ICD-10-CM | POA: Diagnosis not present

## 2023-10-08 DIAGNOSIS — E538 Deficiency of other specified B group vitamins: Secondary | ICD-10-CM | POA: Diagnosis not present

## 2023-10-21 DIAGNOSIS — T300 Burn of unspecified body region, unspecified degree: Secondary | ICD-10-CM | POA: Diagnosis not present

## 2023-10-21 DIAGNOSIS — H109 Unspecified conjunctivitis: Secondary | ICD-10-CM | POA: Diagnosis not present

## 2023-12-08 ENCOUNTER — Ambulatory Visit

## 2023-12-30 DIAGNOSIS — E039 Hypothyroidism, unspecified: Secondary | ICD-10-CM | POA: Diagnosis not present
